# Patient Record
Sex: Female | Born: 1935 | Race: White | Hispanic: No | Smoking: Never smoker
Health system: Southern US, Community
[De-identification: ages and names within clinical notes are randomized; demographics above are authoritative.]

## PROBLEM LIST (undated history)

## (undated) ENCOUNTER — Emergency Department (HOSPITAL_COMMUNITY): Admission: EM | Payer: Medicare HMO

## (undated) DIAGNOSIS — J302 Other seasonal allergic rhinitis: Secondary | ICD-10-CM

## (undated) DIAGNOSIS — Z9989 Dependence on other enabling machines and devices: Secondary | ICD-10-CM

## (undated) DIAGNOSIS — M1990 Unspecified osteoarthritis, unspecified site: Secondary | ICD-10-CM

## (undated) DIAGNOSIS — IMO0002 Reserved for concepts with insufficient information to code with codable children: Secondary | ICD-10-CM

## (undated) DIAGNOSIS — J449 Chronic obstructive pulmonary disease, unspecified: Secondary | ICD-10-CM

## (undated) DIAGNOSIS — K5792 Diverticulitis of intestine, part unspecified, without perforation or abscess without bleeding: Secondary | ICD-10-CM

## (undated) DIAGNOSIS — G4733 Obstructive sleep apnea (adult) (pediatric): Secondary | ICD-10-CM

## (undated) HISTORY — DX: Unspecified osteoarthritis, unspecified site: M19.90

## (undated) HISTORY — PX: CHOLECYSTECTOMY: SHX55

## (undated) HISTORY — DX: Chronic obstructive pulmonary disease, unspecified: J44.9

## (undated) HISTORY — DX: Reserved for concepts with insufficient information to code with codable children: IMO0002

## (undated) HISTORY — PX: APPENDECTOMY: SHX54

## (undated) HISTORY — PX: TOTAL KNEE ARTHROPLASTY: SHX125

## (undated) HISTORY — PX: CATARACT EXTRACTION, BILATERAL: SHX1313

---

## 1977-01-08 HISTORY — PX: PLEURAL SCARIFICATION: SHX748

## 1997-09-21 ENCOUNTER — Encounter (HOSPITAL_COMMUNITY): Admission: RE | Admit: 1997-09-21 | Discharge: 1997-12-20 | Payer: Self-pay | Admitting: *Deleted

## 1997-12-21 ENCOUNTER — Encounter (HOSPITAL_COMMUNITY): Admission: RE | Admit: 1997-12-21 | Discharge: 1998-03-21 | Payer: Self-pay | Admitting: *Deleted

## 1998-03-22 ENCOUNTER — Encounter (HOSPITAL_COMMUNITY): Admission: RE | Admit: 1998-03-22 | Discharge: 1998-06-20 | Payer: Self-pay | Admitting: *Deleted

## 1998-06-06 ENCOUNTER — Encounter: Admission: RE | Admit: 1998-06-06 | Discharge: 1998-06-15 | Payer: Self-pay | Admitting: Orthopaedic Surgery

## 1999-06-15 ENCOUNTER — Encounter: Admission: RE | Admit: 1999-06-15 | Discharge: 1999-06-15 | Payer: Self-pay | Admitting: Obstetrics and Gynecology

## 1999-06-15 ENCOUNTER — Encounter: Payer: Self-pay | Admitting: Obstetrics and Gynecology

## 1999-07-06 ENCOUNTER — Other Ambulatory Visit: Admission: RE | Admit: 1999-07-06 | Discharge: 1999-07-06 | Payer: Self-pay | Admitting: Obstetrics and Gynecology

## 2000-02-06 ENCOUNTER — Ambulatory Visit (HOSPITAL_BASED_OUTPATIENT_CLINIC_OR_DEPARTMENT_OTHER): Admission: RE | Admit: 2000-02-06 | Discharge: 2000-02-06 | Payer: Self-pay | Admitting: Internal Medicine

## 2000-06-27 ENCOUNTER — Encounter: Admission: RE | Admit: 2000-06-27 | Discharge: 2000-06-27 | Payer: Self-pay | Admitting: Obstetrics and Gynecology

## 2000-06-27 ENCOUNTER — Encounter: Payer: Self-pay | Admitting: Obstetrics and Gynecology

## 2000-07-01 ENCOUNTER — Other Ambulatory Visit: Admission: RE | Admit: 2000-07-01 | Discharge: 2000-07-01 | Payer: Self-pay | Admitting: Obstetrics and Gynecology

## 2000-07-04 ENCOUNTER — Encounter: Payer: Self-pay | Admitting: Obstetrics and Gynecology

## 2000-07-04 ENCOUNTER — Encounter: Admission: RE | Admit: 2000-07-04 | Discharge: 2000-07-04 | Payer: Self-pay | Admitting: Obstetrics and Gynecology

## 2000-12-04 ENCOUNTER — Emergency Department (HOSPITAL_COMMUNITY): Admission: EM | Admit: 2000-12-04 | Discharge: 2000-12-04 | Payer: Self-pay

## 2000-12-05 ENCOUNTER — Encounter: Payer: Self-pay | Admitting: *Deleted

## 2001-02-27 ENCOUNTER — Ambulatory Visit (HOSPITAL_COMMUNITY): Admission: RE | Admit: 2001-02-27 | Discharge: 2001-02-27 | Payer: Self-pay | Admitting: Gastroenterology

## 2001-04-17 ENCOUNTER — Encounter: Payer: Self-pay | Admitting: Internal Medicine

## 2001-04-17 ENCOUNTER — Encounter: Admission: RE | Admit: 2001-04-17 | Discharge: 2001-04-17 | Payer: Self-pay | Admitting: Internal Medicine

## 2001-08-06 ENCOUNTER — Other Ambulatory Visit: Admission: RE | Admit: 2001-08-06 | Discharge: 2001-08-06 | Payer: Self-pay | Admitting: Gynecology

## 2002-01-13 ENCOUNTER — Inpatient Hospital Stay (HOSPITAL_COMMUNITY): Admission: EM | Admit: 2002-01-13 | Discharge: 2002-01-16 | Payer: Self-pay | Admitting: Emergency Medicine

## 2002-01-15 ENCOUNTER — Encounter: Payer: Self-pay | Admitting: Internal Medicine

## 2002-10-15 ENCOUNTER — Encounter: Payer: Self-pay | Admitting: Internal Medicine

## 2002-10-15 ENCOUNTER — Encounter: Admission: RE | Admit: 2002-10-15 | Discharge: 2002-10-15 | Payer: Self-pay | Admitting: Internal Medicine

## 2003-01-14 ENCOUNTER — Ambulatory Visit (HOSPITAL_COMMUNITY): Admission: RE | Admit: 2003-01-14 | Discharge: 2003-01-14 | Payer: Self-pay | Admitting: Orthopaedic Surgery

## 2003-02-05 ENCOUNTER — Emergency Department (HOSPITAL_COMMUNITY): Admission: EM | Admit: 2003-02-05 | Discharge: 2003-02-05 | Payer: Self-pay | Admitting: Family Medicine

## 2003-09-06 ENCOUNTER — Encounter: Admission: RE | Admit: 2003-09-06 | Discharge: 2003-09-06 | Payer: Self-pay | Admitting: Gastroenterology

## 2004-01-19 ENCOUNTER — Ambulatory Visit (HOSPITAL_BASED_OUTPATIENT_CLINIC_OR_DEPARTMENT_OTHER): Admission: RE | Admit: 2004-01-19 | Discharge: 2004-01-19 | Payer: Self-pay | Admitting: Internal Medicine

## 2004-01-19 ENCOUNTER — Ambulatory Visit: Payer: Self-pay | Admitting: Internal Medicine

## 2004-02-08 ENCOUNTER — Ambulatory Visit: Payer: Self-pay | Admitting: Internal Medicine

## 2004-03-18 ENCOUNTER — Inpatient Hospital Stay (HOSPITAL_COMMUNITY): Admission: EM | Admit: 2004-03-18 | Discharge: 2004-03-22 | Payer: Self-pay | Admitting: Emergency Medicine

## 2004-04-11 ENCOUNTER — Ambulatory Visit: Payer: Self-pay | Admitting: Physical Medicine & Rehabilitation

## 2004-04-11 ENCOUNTER — Inpatient Hospital Stay (HOSPITAL_COMMUNITY): Admission: RE | Admit: 2004-04-11 | Discharge: 2004-04-14 | Payer: Self-pay | Admitting: Orthopaedic Surgery

## 2004-04-14 ENCOUNTER — Inpatient Hospital Stay
Admission: RE | Admit: 2004-04-14 | Discharge: 2004-04-20 | Payer: Self-pay | Admitting: Physical Medicine & Rehabilitation

## 2004-04-15 ENCOUNTER — Ambulatory Visit (HOSPITAL_COMMUNITY)
Admission: RE | Admit: 2004-04-15 | Discharge: 2004-04-15 | Payer: Self-pay | Admitting: Physical Medicine & Rehabilitation

## 2004-06-07 ENCOUNTER — Ambulatory Visit: Payer: Self-pay | Admitting: Internal Medicine

## 2004-09-10 ENCOUNTER — Emergency Department (HOSPITAL_COMMUNITY): Admission: EM | Admit: 2004-09-10 | Discharge: 2004-09-11 | Payer: Self-pay | Admitting: Emergency Medicine

## 2004-10-09 ENCOUNTER — Encounter: Admission: RE | Admit: 2004-10-09 | Discharge: 2004-11-07 | Payer: Self-pay | Admitting: Orthopaedic Surgery

## 2004-11-07 ENCOUNTER — Other Ambulatory Visit: Admission: RE | Admit: 2004-11-07 | Discharge: 2004-11-07 | Payer: Self-pay | Admitting: Gynecology

## 2004-12-05 ENCOUNTER — Ambulatory Visit: Payer: Self-pay | Admitting: Internal Medicine

## 2005-05-18 ENCOUNTER — Ambulatory Visit: Payer: Self-pay | Admitting: Internal Medicine

## 2005-05-29 ENCOUNTER — Ambulatory Visit: Payer: Self-pay | Admitting: Internal Medicine

## 2005-08-24 IMAGING — CR DG CHEST 2V
2 series · 2 of 2 positions shown · non-contrast
Comparison: 03/22/04.

CLINICAL DATA: Preop evaluation for osteoarthritis.
 CHEST ? 2 VIEW:

[view not recorded (1 of 2)]
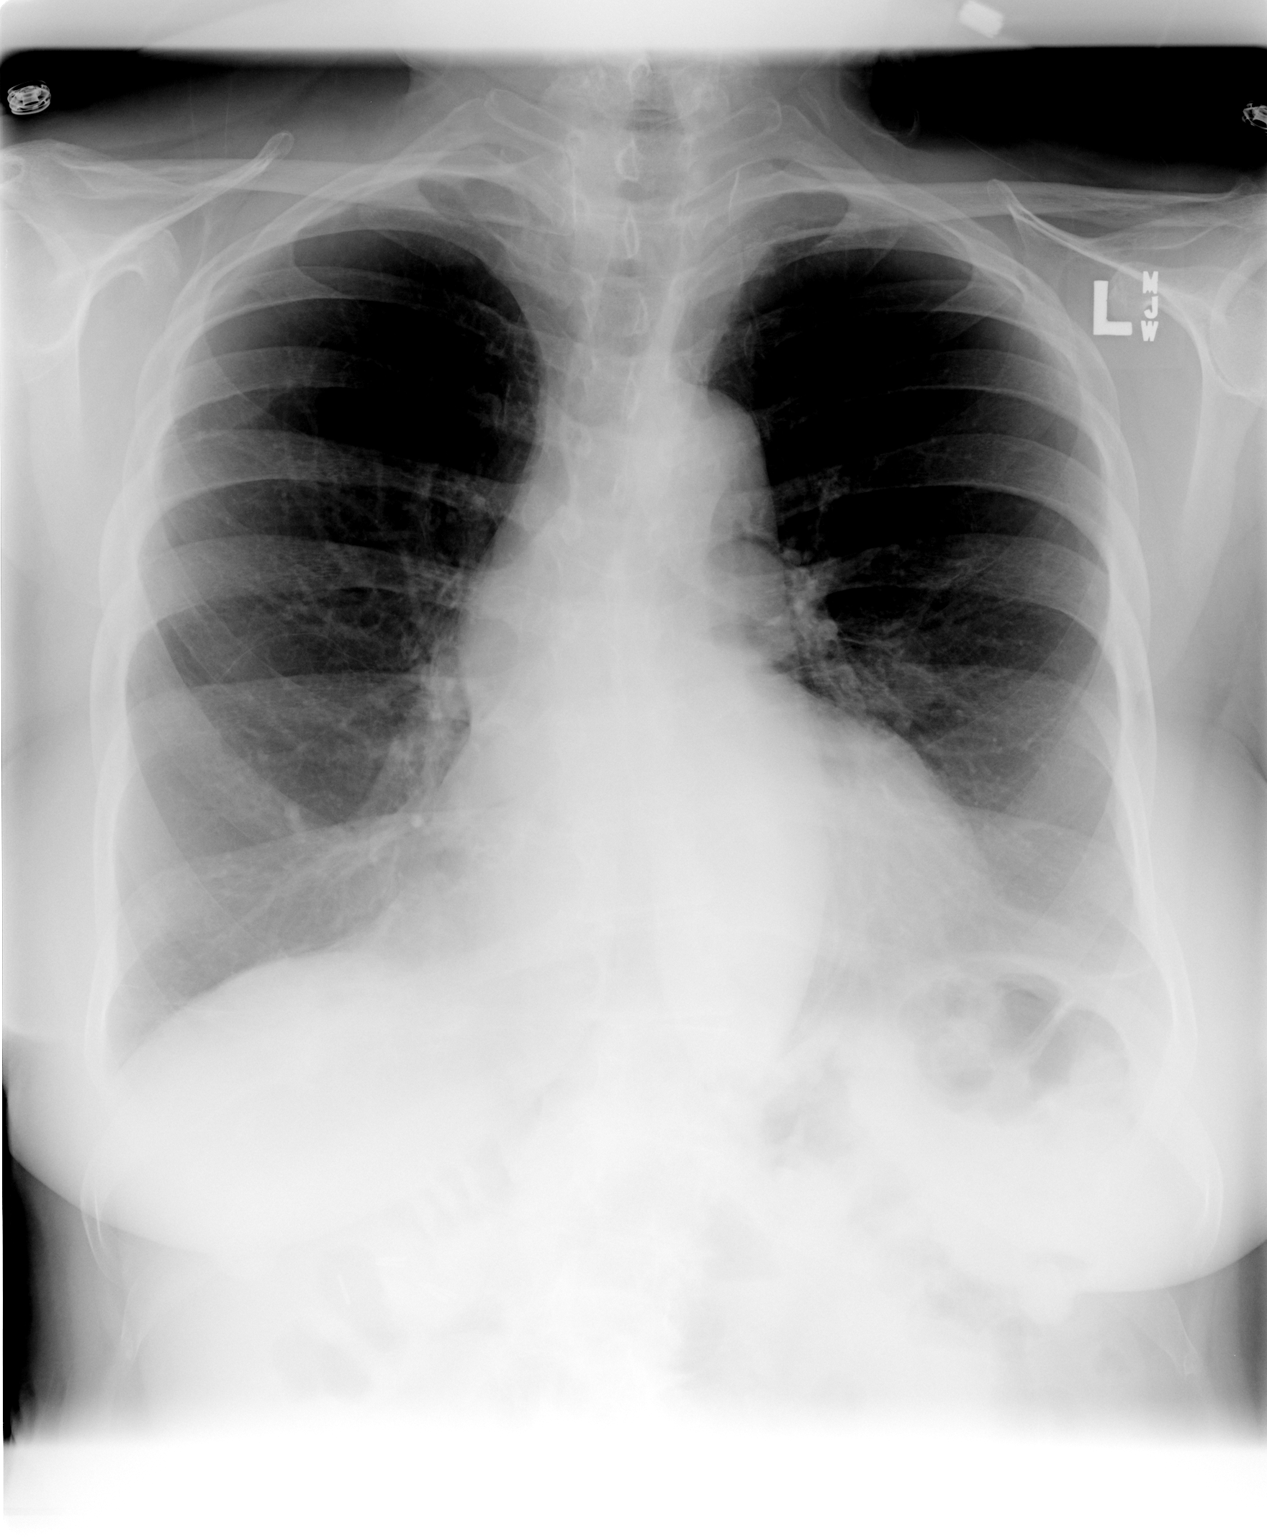

[view not recorded (2 of 2)]
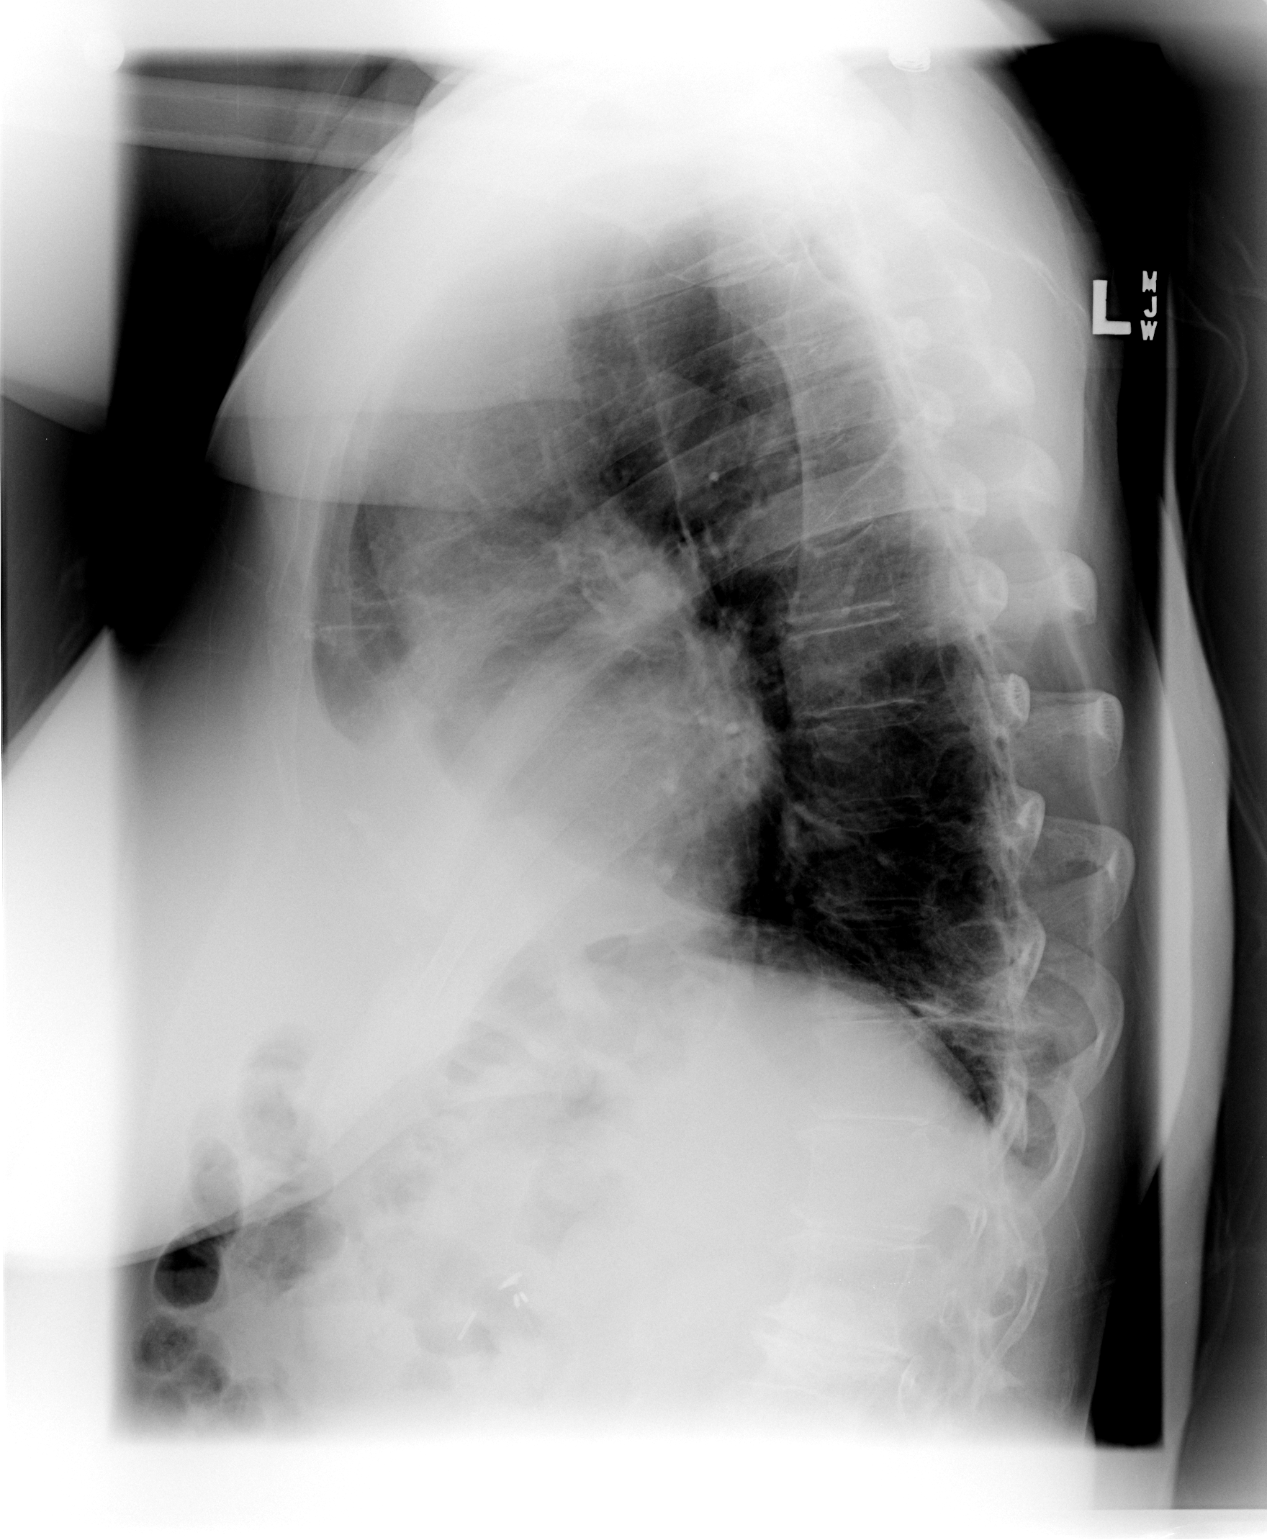

[2 of 2 positions shown; findings below may reference images not displayed]

FINDINGS: Cardiopericardial silhouette is enlarged, but stable.  Basilar atelectasis or scarring on the left is most prominent on the lateral film.  Tortuosity of the thoracic aorta suggests hypertension.  Imaged bony structures are unremarkable.
IMPRESSION: No acute cardiopulmonary process.

## 2005-11-05 ENCOUNTER — Ambulatory Visit (HOSPITAL_BASED_OUTPATIENT_CLINIC_OR_DEPARTMENT_OTHER): Admission: RE | Admit: 2005-11-05 | Discharge: 2005-11-05 | Payer: Self-pay | Admitting: Internal Medicine

## 2005-11-11 ENCOUNTER — Ambulatory Visit: Payer: Self-pay | Admitting: Internal Medicine

## 2006-01-10 ENCOUNTER — Ambulatory Visit (HOSPITAL_BASED_OUTPATIENT_CLINIC_OR_DEPARTMENT_OTHER): Admission: RE | Admit: 2006-01-10 | Discharge: 2006-01-10 | Payer: Self-pay | Admitting: Internal Medicine

## 2006-01-13 ENCOUNTER — Ambulatory Visit: Payer: Self-pay | Admitting: Internal Medicine

## 2006-02-05 ENCOUNTER — Ambulatory Visit: Payer: Self-pay | Admitting: Internal Medicine

## 2006-12-02 ENCOUNTER — Other Ambulatory Visit: Admission: RE | Admit: 2006-12-02 | Discharge: 2006-12-02 | Payer: Self-pay | Admitting: Gynecology

## 2007-01-08 ENCOUNTER — Encounter: Payer: Self-pay | Admitting: Internal Medicine

## 2007-01-09 HISTORY — PX: PARTIAL COLECTOMY: SHX5273

## 2007-01-13 ENCOUNTER — Encounter: Admission: RE | Admit: 2007-01-13 | Discharge: 2007-01-13 | Payer: Self-pay | Admitting: General Surgery

## 2007-01-17 ENCOUNTER — Encounter: Admission: RE | Admit: 2007-01-17 | Discharge: 2007-01-17 | Payer: Self-pay | Admitting: General Surgery

## 2007-01-24 ENCOUNTER — Encounter: Payer: Self-pay | Admitting: Internal Medicine

## 2007-01-28 DIAGNOSIS — J449 Chronic obstructive pulmonary disease, unspecified: Secondary | ICD-10-CM

## 2007-01-28 DIAGNOSIS — J4489 Other specified chronic obstructive pulmonary disease: Secondary | ICD-10-CM | POA: Insufficient documentation

## 2007-01-28 DIAGNOSIS — M1990 Unspecified osteoarthritis, unspecified site: Secondary | ICD-10-CM | POA: Insufficient documentation

## 2007-01-28 DIAGNOSIS — G4733 Obstructive sleep apnea (adult) (pediatric): Secondary | ICD-10-CM

## 2007-01-29 ENCOUNTER — Ambulatory Visit: Payer: Self-pay | Admitting: Internal Medicine

## 2007-02-18 ENCOUNTER — Inpatient Hospital Stay (HOSPITAL_COMMUNITY): Admission: RE | Admit: 2007-02-18 | Discharge: 2007-02-23 | Payer: Self-pay | Admitting: General Surgery

## 2007-02-18 ENCOUNTER — Ambulatory Visit: Payer: Self-pay | Admitting: Oncology

## 2007-02-18 ENCOUNTER — Encounter (INDEPENDENT_AMBULATORY_CARE_PROVIDER_SITE_OTHER): Payer: Self-pay | Admitting: General Surgery

## 2007-02-21 ENCOUNTER — Ambulatory Visit: Payer: Self-pay | Admitting: Oncology

## 2007-02-26 ENCOUNTER — Encounter: Payer: Self-pay | Admitting: Internal Medicine

## 2007-05-09 ENCOUNTER — Encounter: Admission: RE | Admit: 2007-05-09 | Discharge: 2007-05-09 | Payer: Self-pay | Admitting: Orthopaedic Surgery

## 2007-08-07 ENCOUNTER — Encounter: Admission: RE | Admit: 2007-08-07 | Discharge: 2007-08-07 | Payer: Self-pay | Admitting: Internal Medicine

## 2007-09-09 ENCOUNTER — Encounter: Admission: RE | Admit: 2007-09-09 | Discharge: 2007-09-09 | Payer: Self-pay | Admitting: Neurology

## 2007-09-18 ENCOUNTER — Ambulatory Visit: Payer: Self-pay

## 2008-01-29 ENCOUNTER — Ambulatory Visit: Payer: Self-pay | Admitting: Internal Medicine

## 2008-02-13 DIAGNOSIS — C189 Malignant neoplasm of colon, unspecified: Secondary | ICD-10-CM | POA: Insufficient documentation

## 2008-04-27 ENCOUNTER — Encounter: Admission: RE | Admit: 2008-04-27 | Discharge: 2008-04-27 | Payer: Self-pay | Admitting: General Surgery

## 2008-05-13 ENCOUNTER — Encounter: Payer: Self-pay | Admitting: Internal Medicine

## 2008-10-06 ENCOUNTER — Encounter
Admission: RE | Admit: 2008-10-06 | Discharge: 2009-01-04 | Payer: Self-pay | Admitting: Physical Medicine and Rehabilitation

## 2008-11-30 ENCOUNTER — Ambulatory Visit: Payer: Self-pay | Admitting: Internal Medicine

## 2008-12-30 ENCOUNTER — Ambulatory Visit: Payer: Self-pay | Admitting: Internal Medicine

## 2008-12-30 DIAGNOSIS — R0602 Shortness of breath: Secondary | ICD-10-CM

## 2009-01-19 ENCOUNTER — Telehealth: Payer: Self-pay | Admitting: Internal Medicine

## 2009-02-10 ENCOUNTER — Encounter (HOSPITAL_COMMUNITY): Admission: RE | Admit: 2009-02-10 | Discharge: 2009-05-11 | Payer: Self-pay | Admitting: Internal Medicine

## 2009-02-15 ENCOUNTER — Encounter: Payer: Self-pay | Admitting: Internal Medicine

## 2009-03-04 ENCOUNTER — Telehealth: Payer: Self-pay | Admitting: Internal Medicine

## 2009-03-16 ENCOUNTER — Telehealth: Payer: Self-pay | Admitting: Internal Medicine

## 2009-03-28 ENCOUNTER — Telehealth (INDEPENDENT_AMBULATORY_CARE_PROVIDER_SITE_OTHER): Payer: Self-pay | Admitting: *Deleted

## 2009-03-31 ENCOUNTER — Telehealth (INDEPENDENT_AMBULATORY_CARE_PROVIDER_SITE_OTHER): Payer: Self-pay | Admitting: *Deleted

## 2009-03-31 DIAGNOSIS — R197 Diarrhea, unspecified: Secondary | ICD-10-CM | POA: Insufficient documentation

## 2009-04-01 ENCOUNTER — Ambulatory Visit: Payer: Self-pay | Admitting: Internal Medicine

## 2009-04-12 ENCOUNTER — Encounter (INDEPENDENT_AMBULATORY_CARE_PROVIDER_SITE_OTHER): Payer: Self-pay | Admitting: *Deleted

## 2009-05-09 ENCOUNTER — Encounter: Admission: RE | Admit: 2009-05-09 | Discharge: 2009-05-09 | Payer: Self-pay | Admitting: General Surgery

## 2009-05-16 ENCOUNTER — Encounter: Payer: Self-pay | Admitting: Internal Medicine

## 2009-05-17 ENCOUNTER — Encounter: Payer: Self-pay | Admitting: Internal Medicine

## 2009-07-04 ENCOUNTER — Telehealth (INDEPENDENT_AMBULATORY_CARE_PROVIDER_SITE_OTHER): Payer: Self-pay | Admitting: *Deleted

## 2009-09-28 ENCOUNTER — Ambulatory Visit: Payer: Self-pay | Admitting: Internal Medicine

## 2009-09-28 DIAGNOSIS — B370 Candidal stomatitis: Secondary | ICD-10-CM

## 2009-11-02 ENCOUNTER — Telehealth (INDEPENDENT_AMBULATORY_CARE_PROVIDER_SITE_OTHER): Payer: Self-pay | Admitting: *Deleted

## 2009-11-29 ENCOUNTER — Encounter: Payer: Self-pay | Admitting: Internal Medicine

## 2010-01-06 ENCOUNTER — Encounter: Payer: Self-pay | Admitting: Internal Medicine

## 2010-01-06 ENCOUNTER — Telehealth: Payer: Self-pay | Admitting: Internal Medicine

## 2010-01-10 ENCOUNTER — Telehealth: Payer: Self-pay | Admitting: Internal Medicine

## 2010-01-12 ENCOUNTER — Encounter: Payer: Self-pay | Admitting: Internal Medicine

## 2010-01-12 LAB — URINALYSIS, ROUTINE W REFLEX MICROSCOPIC
Bilirubin Urine: NEGATIVE
Hemoglobin, Urine: NEGATIVE
Ketones, ur: NEGATIVE mg/dL
Nitrite: NEGATIVE
Protein, ur: NEGATIVE mg/dL
Specific Gravity, Urine: 1.022 (ref 1.005–1.030)
Urine Glucose, Fasting: NEGATIVE mg/dL
Urobilinogen, UA: 0.2 mg/dL (ref 0.0–1.0)
pH: 6 (ref 5.0–8.0)

## 2010-01-12 LAB — COMPREHENSIVE METABOLIC PANEL
ALT: 24 U/L (ref 0–35)
AST: 27 U/L (ref 0–37)
Albumin: 3.9 g/dL (ref 3.5–5.2)
Alkaline Phosphatase: 58 U/L (ref 39–117)
BUN: 12 mg/dL (ref 6–23)
CO2: 26 mEq/L (ref 19–32)
Calcium: 9.9 mg/dL (ref 8.4–10.5)
Chloride: 106 mEq/L (ref 96–112)
Creatinine, Ser: 0.76 mg/dL (ref 0.4–1.2)
GFR calc Af Amer: >60 mL/min (ref >60)
GFR calc non Af Amer: >60 mL/min (ref >60)
Glucose, Bld: 90 mg/dL (ref 70–99)
Potassium: 4.2 mEq/L (ref 3.5–5.1)
Sodium: 140 mEq/L (ref 135–145)
Total Bilirubin: 0.5 mg/dL (ref 0.3–1.2)
Total Protein: 7.1 g/dL (ref 6.0–8.3)

## 2010-01-12 LAB — DIFFERENTIAL
Basophils Absolute: 0.1 10*3/uL (ref 0.0–0.1)
Basophils Relative: 1 % (ref 0–1)
Eosinophils Absolute: 0.1 10*3/uL (ref 0.0–0.7)
Eosinophils Relative: 2 % (ref 0–5)
Lymphocytes Relative: 24 % (ref 12–46)
Lymphs Abs: 1.8 10*3/uL (ref 0.7–4.0)
Monocytes Absolute: 0.5 10*3/uL (ref 0.1–1.0)
Monocytes Relative: 7 % (ref 3–12)
Neutro Abs: 4.9 10*3/uL (ref 1.7–7.7)
Neutrophils Relative %: 66 % (ref 43–77)

## 2010-01-12 LAB — CBC
HCT: 41.3 % (ref 36.0–46.0)
Hemoglobin: 13.5 g/dL (ref 12.0–15.0)
MCH: 29.5 pg (ref 26.0–34.0)
MCHC: 32.7 g/dL (ref 30.0–36.0)
MCV: 90.4 fL (ref 78.0–100.0)
Platelets: 287 10*3/uL (ref 150–400)
RBC: 4.57 MIL/uL (ref 3.87–5.11)
RDW: 12.9 % (ref 11.5–15.5)
WBC: 7.3 10*3/uL (ref 4.0–10.5)

## 2010-01-13 ENCOUNTER — Ambulatory Visit
Admission: RE | Admit: 2010-01-13 | Discharge: 2010-01-13 | Payer: Self-pay | Source: Home / Self Care | Attending: Internal Medicine | Admitting: Internal Medicine

## 2010-01-13 ENCOUNTER — Encounter: Payer: Self-pay | Admitting: Internal Medicine

## 2010-01-15 ENCOUNTER — Encounter: Payer: Self-pay | Admitting: Internal Medicine

## 2010-01-20 ENCOUNTER — Ambulatory Visit (HOSPITAL_COMMUNITY)
Admission: RE | Admit: 2010-01-20 | Discharge: 2010-01-21 | Payer: Self-pay | Source: Home / Self Care | Attending: General Surgery | Admitting: General Surgery

## 2010-02-02 ENCOUNTER — Encounter: Payer: Self-pay | Admitting: Internal Medicine

## 2010-02-02 LAB — SURGICAL PCR SCREEN
MRSA, PCR: NEGATIVE
Staphylococcus aureus: NEGATIVE

## 2010-02-07 NOTE — Progress Notes (Signed)
Summary: pulm rehab  Phone Note Call from Patient Call back at Home Phone [phone]   Caller: Patient Call For: young Summary of Call: pt states she is still waiting to hear from nurse re: pulm rehab.  Initial call taken by: Tivis Ringer,  January 19, 2009 12:45 PM  Follow-up for Phone Call        pt states she still hasn't heard from anyone regarding getting set up for pulm rehab.  Per EMR,  CY ordered pulm rehab 12-30-2008. Will forward to PCCs to address.  Aundra Millet Reynolds LPN  January 19, 2009 3:07 PM   Additional Follow-up for Phone Call Additional follow up Details #1::        Pt was advised at time of ov that it normally takes 2-3 wks once order is sent for Phyllis at [facility] to contact them. Pt was seen on 12/30/08. Explained to pt that due to the holidays and the snow that it may be closer to 3 wks before she will hear from [place]. Called Pulmonary Rehab and asked them if they could contact her with information about the program and schedule if possible. Pt aware. Rhonda Cobb  January 19, 2009 3:12 PM

## 2010-02-07 NOTE — Progress Notes (Signed)
Summary: GREEN SPUTUM  Phone Note Call from Patient   Caller: Patient Call For: YOUNG Summary of Call: PT COUGHING UP GREEN SP[facility] [phone] Initial call taken by: Rickard Patience,  March 04, 2009 11:56 AM  Follow-up for Phone Call        Spoke with pt earlier today and she stated at that time that her cough was dry, denied chest congestion or coughing up any phlegm. Pt states she just cough and coughed up green phlegm and she does now feel like she has some congstion in her chest. Pt has rx sor cough syrup. Please advise.Carron Curie CMA  March 04, 2009 11:59 AM allergies: pcn, sulfa  per CY doxycycline 100 2 today then one daily until gone #8.  rx sent/ pt aware. Carron Curie CMA  March 04, 2009 12:16 PM     New/Updated Medications: DOXYCYCLINE HYCLATE 100 MG TABS (DOXYCYCLINE HYCLATE) 2 tabs today then one daily until gone Prescriptions: DOXYCYCLINE HYCLATE 100 MG TABS (DOXYCYCLINE HYCLATE) 2 tabs today then one daily until gone  #8 x 0   Entered by:   Carron Curie CMA   Authorized by:   Waymon Budge MD   Signed by:   Carron Curie CMA on 03/04/2009   Method used:   Electronically to        [facility]  [phone]* (retail)       [address]       [place], [place]  [address]       Ph: [phone] or [phone]       Fax: [phone]   RxID:   [id number]

## 2010-02-07 NOTE — Letter (Signed)
Summary: Dr [facility]  Dr [facility]   Imported By: Sherian Rein 06/03/2009 13:35:19  _____________________________________________________________________  External Attachment:    Type:   Image     Comment:   External Document

## 2010-02-07 NOTE — Progress Notes (Signed)
Summary: YEAST INFECTION IN HER MOUTH  Phone Note Call from Patient Call back at Home Phone [phone]   Caller: Patient Call For: YOUNG Summary of Call: PT NEEDS SOMETHING FOR A YEAST INFECTION IN HER MOUTH [facility] Initial call taken by: Lacinda Axon,  July 04, 2009 12:05 PM  Follow-up for Phone Call        Pt c/o trush- white film on her toungue and sore mouth.  Would like "lozenges" for this called in.  She states that she does rinse mouth well after she uses both spiriva and advair. Please advise, thanks! Follow-up by: Vernie Murders,  July 04, 2009 12:20 PM  Additional Follow-up for Phone Call Additional follow up Details #1::        OK to send mycelex troches, # 30, melt in mouth three times a day. Suggest she try taking Advair before meals, so the mechanics of chewing and swallowing help clear her mouth. Additional Follow-up by: Waymon Budge MD,  July 04, 2009 1:03 PM    Additional Follow-up for Phone Call Additional follow up Details #2::    Called, spoke with pt.  Pt informed [facility] and advised her of above recs per CY.  She verbalized understanding.  Gweneth Dimitri RN  July 04, 2009 1:41 PM   New/Updated Medications: MYCELEX 10 MG TROC (CLOTRIMAZOLE) melt in mouth three times a day Prescriptions: MYCELEX 10 MG TROC (CLOTRIMAZOLE) melt in mouth three times a day  #30 x 0   Entered by:   Gweneth Dimitri RN   Authorized by:   Waymon Budge MD   Signed by:   Gweneth Dimitri RN on 07/04/2009   Method used:   Electronically to        [facility]  [phone]* (retail)       [address]       [place], [place]  [address]       Ph: [phone] or [phone]       Fax: [phone]   RxID:   [id number]

## 2010-02-07 NOTE — Assessment & Plan Note (Signed)
Summary: rov 6 months///kp   Primary Provider/Referring Provider:  Jeremy Johann  CC:  6 month follow up visit-asthma;was advised to stop Pulmonary Rehab due to weakness; breathing hasnt  been to bad except for today.Marland Kitchen  History of Present Illness: December 30, 2008- Asthma, COPD Still feels limited bvy dyspnea since colon cancer surgery. Weight last visit 158. Dyspnea with exertion. OK at rest and denies cough or congestion. Denbies known heart disease Had a lot of pneumnia, cough or wheeze when younger- no longer. Her father smoked heavily/ died COPD. Her A1AT test was NL.She did pulmonary rehab remotely. CXR-  basilar atelectasis, NAD - Only 192 m, stoped early sob and dizzy, w/ nl oxygenation 97%/ 95, 96% PFT- severe COPD w/ some response to dilator, Nl DLCO, airtrapping and hyperinflation  April 01, 2009- Asthma, COPD Diarrhea started 3-4 days after finishing doxycycline and persisted after kaopectate x 2 days. We referred to hx of colon surgery. Breathing is better and she is off prednisone. Sore across lower abdomen but not real pain like previous diverticulitis, no blood. No fever or chills.  September 28, 2009- Asthma, COPD, hx OSA Cites generalized malaise for not using CPAP saying she is resting well and breathing has been good. Complains of persistent thrush and says postnasal drip tastes like it. Being evaluated for persistent diarhea- Dr Dulce Sellar. Not using Advair regularly. Used rescue inhaler 0-2 or 3 x/ day, blaming the humid hot summer weather. Cites the thrush for not wanting to use the Advair regularly. Diflucan helped for awhile. Dry cough x few months- off Micardis now.  Not using Spiriva- not using, not helpful.    Asthma History    Initial Asthma Severity Rating:    Age range: 12+ years    Symptoms: >2 days/week; not daily    Nighttime Awakenings: 0-2/month    Interferes w/ normal activity: no limitations    SABA use (not for EIB): >2 days/week but not >1X/day    Asthma Severity Assessment: Mild Persistent   Preventive Screening-Counseling & Management  Alcohol-Tobacco     Smoking Status: never     Passive Smoke Exposure: yes  Current Medications (verified): 1)  Synthroid 100 Mcg  Tabs (Levothyroxine Sodium) .... Take 1 Tablet By Mouth Once A Day 2)  Prilosec Otc 20 Mg  Tbec (Omeprazole Magnesium) .... Take 1 Capsule By Mouth Once A Day 3)  Singulair 10 Mg  Tabs (Montelukast Sodium) .... Take 1 Tablet By Mouth Once A Day 4)  Advair Diskus 100-50 Mcg/dose  Misc (Fluticasone-Salmeterol) .... Use One Puff Two Times A Day Rinse Mouth After Each Use 5)  Cpap .... Set At 9; [facility] 6)  Proair Hfa 108 (90 Base) Mcg/act Aers (Albuterol Sulfate) .... 2 Puffs Four Times A Day As Needed 7)  Klor-Con 8 Meq Cr-Tabs (Potassium Chloride) .... Take 2 By Mouth Two Times A Day 8)  Cetirizine Hcl 10 Mg Tabs (Cetirizine Hcl) .... Take 1 By Mouth Once Daily 9)  Vitamin D 2000 Unit Tabs (Cholecalciferol) .... Take 1 By Mouth Once Daily 10)  Melatonin 5 Mg Tabs (Melatonin) .... Take 2 By Mouth At Bedtime 11)  Zoloft 50 Mg Tabs (Sertraline Hcl) .... Take 1 By Mouth Once Daily 12)  Spiriva Handihaler 18 Mcg Caps (Tiotropium Bromide Monohydrate) .Marland Kitchen.. 1 Daily 13)  Flagyl 500 Mg Tabs (Metronidazole) .... Take 1 Tablet By Mouth Three Times A Day For 7 Days 14)  Mycelex 10 Mg Troc (Clotrimazole) .... Melt in Mouth Three Times A Day 15)  Align  Caps (Probiotic Product) .... Take 1 By Mouth Once Daily  Allergies (verified): 1)  Penicillin G Potassium (Penicillin G Potassium) 2)  Sulfamethoxazole (Sulfamethoxazole)  Past History:  Past Medical History: Last updated: 01/29/2008 Asthma with COPD -was considered steroid dependent, kept on predisone 1976 until 2006 adenocarcioma "cyst", abdominal 2009 Osteoarthritis Sleep apnea NPSG AHI 17.9/hr. FEV1/FVC 05/29/05 0.43 No hx cardiac or coagulopathy degenerative disk disease  Past Surgical History: Last updated:  04/01/2009 asthma/ copd Recurrent spontaneuous pneumothoraxs  L - finally had talc pleurodesis- 1979. Appendectomy Cholecystectomy Total Knee Arthroplasty-L Partial colectomy for diverticulitis -2009  Family History: Last updated: 01/28/2009 Father died COPD Mother died MI Brother - has copd  Social History: Last updated: 2009-01-28 Patient never smoked.  Retired- Writer at [employer] Positive history of passive tobacco smoke exposure- significant exposure to parents, heavy smokers  Risk Factors: Smoking Status: never (09/28/2009) Passive Smoke Exposure: yes (09/28/2009)  Review of Systems      See HPI       The patient complains of shortness of breath with activity.  The patient denies shortness of breath at rest, productive cough, non-productive cough, coughing up blood, chest pain, irregular heartbeats, acid heartburn, indigestion, loss of appetite, weight change, abdominal pain, difficulty swallowing, tooth/dental problems, headaches, nasal congestion/difficulty breathing through nose, sneezing, itching, ear ache, and rash.    Vital Signs:  Patient profile:   76 year old female Height:      61 inches Weight:      156.13 pounds BMI:     29.61 O2 Sat:      95 % on Room air Pulse rate:   113 / minute BP sitting:   146 / 92  (right arm) Cuff size:   regular  Vitals Entered By: Reynaldo Minium CMA (September 28, 2009 10:49 AM)  O2 Flow:  Room air CC: 6 month follow up visit-asthma;was advised to stop Pulmonary Rehab due to weakness; breathing hasnt  been to bad except for today.   Physical Exam  Additional Exam:  General: A/Ox3; pleasant and cooperative, NAD, calm SKIN: no rash, lesions NODES: no lymphadenopathy HEENT: [place]/AT, EOM- WNL, Conjuctivae- clear, PERRLA, TM-WNL, Nose- clear, Throat- thrush/ coated tongue,  Mallampatii III, dental repair NECK: Supple w/ fair ROM, JVD- none, normal carotid impulses w/o bruits Thyroid-  CHEST: Clear to P&A, no  rales, dullness or rhonchi, unlabored HEART: RRR, no m/g/r heard ABDOMEN: Soft and nl;  FIE:PPIR, nl pulses, no edema  NEURO: Grossly intact to observation       Impression & Recommendations:  Problem # 1:  OBSTRUCTIVE SLEEP APNEA (ICD-327.23)  Not using CPAP and I am not sure it is a significant issue to push now. She lives alone and has been uncomfortable with unrelated problems, so it is hard to motivate her. We will carry this problem forward and reassess as appropriate.  Orders: Est. Patient Level IV (51884)  Problem # 2:  ASTHMA (ICD-493.90) Good control usually with only a rescue inhaler. She has had at least 2 pneumovax. With recurrent thrush, I will have her stay off the Advair for now and see what happens. She will use the rescue inhaler as needed and stay on Singulair.  Problem # 3:  COPD (ICD-496) She oxygenates well although previous PFT looked like pretty severe obstruction. Jhonnie Garner she feels a little stronger she may be a ble to blow a little harder for another try.  Problem # 4:  THRUSH (ICD-112.0) Will give diflucan. As long as her  breathing is doing well we have an opportunity to be off inhaled steroids which should help. She denies diabetes.  Medications Added to Medication List This Visit: 1)  Align Caps (Probiotic product) .... Take 1 by mouth once daily 2)  Fluconazole 150 Mg Tabs (Fluconazole) .Marland Kitchen.. 1 dailyu for yeast  Other Orders: Flu Vaccine 21yrs + MEDICARE PATIENTS (Z6109) Administration Flu vaccine - MCR (U0454)  Patient Instructions: 1)  Please schedule a follow-up appointment in 6 months. 2)  Flu vax 3)  Set the Advair aside for now unless really needed. if you find you are having to use the rescue inhaler Proair more than once a day routinely let me know and we will consider options. 4)  As you find you are feeling better we can re-evaluate the CPAP issue. 5)  Script for Diflucan/ fliuconazole for the thrush. Prescriptions: FLUCONAZOLE 150 MG  TABS (FLUCONAZOLE) 1 dailyu for yeast  #7 x 0   Entered and Authorized by:   Waymon Budge MD   Signed by:   Waymon Budge MD on 09/28/2009   Method used:   Print then Give to Patient   RxID:   [id number]  Flu Vaccine Consent Questions     Do you have a history of severe allergic reactions to this vaccine? no    Any prior history of allergic reactions to egg and/or gelatin? no    Do you have a sensitivity to the preservative Thimersol? no    Do you have a past history of Guillan-Barre Syndrome? no    Do you currently have an acute febrile illness? no    Have you ever had a severe reaction to latex? no    Vaccine information given and explained to patient? yes    Are you currently pregnant? no    Lot Number:[id number]   Exp Date:07/08/2010   Reynaldo Minium CMA  September 28, 2009 11:56 AM    Site Given  Right Deltoid IMy:   Waymon Budge MD on 09/28/2009   Method used:   Print then Give to Patient   RxID:   [id number]    .[email or web address]

## 2010-02-07 NOTE — Progress Notes (Signed)
Summary: rx request- cough  Phone Note Call from Patient Call back at Home Phone [phone]   Caller: Patient Call For: Nancy Cole Summary of Call: pt c/o dry cough- worse at night- keeping her from sleeping. she has had this cough for several weeks but now feels like she has a cold as well. denies fever/ chills. has been taking benadryl is requesting cough syrup. [facility] [phone]. call pt at home # Initial call taken by: Tivis Ringer, CNA,  March 04, 2009 10:11 AM  Follow-up for Phone Call        Pt states she has had dry cough for several weeks but is has gotten worse over last few days and it is worse at night when sh egoes to bed. Pt states she has tried benadryl and OTC cough syrup with no relief. SH eis requesting an rx for cough syrup. Please advise. Carron Curie CMA  March 04, 2009 10:36 AM allergies: PCN, SUlfa  Additional Follow-up for Phone Call Additional follow up Details #1::        Per CDY-Hydromet #250ml take 1 tsp every 6 hours as needed cough No refills.Reynaldo Minium CMA  March 04, 2009 11:14 AM   rx sent. pt aware.Carron Curie CMA  March 04, 2009 11:18 AM     New/Updated Medications: HYDROMET 5-1.5 MG/5ML SYRP (HYDROCODONE-HOMATROPINE) 1 teaspoon every 6 hours as needed Prescriptions: HYDROMET 5-1.5 MG/5ML SYRP (HYDROCODONE-HOMATROPINE) 1 teaspoon every 6 hours as needed  #200 ml x 0   Entered by:   Carron Curie CMA   Authorized by:   Waymon Budge MD   Signed by:   Carron Curie CMA on 03/04/2009   Method used:   [facility]       [facility]  [address]  [phone]* (retail)       [address] [place], [place]  [address]       Ph: [phone] or [phone]       Fax: [phone]   RxID:   [id number]

## 2010-02-07 NOTE — Assessment & Plan Note (Signed)
Summary: 3 mo rov/LC   Primary Provider/Referring Provider:  Jeremy Johann  CC:  Follow up visit-diarrhea x 3 weeks;slight SOB and wheezing. Recent Flu and URI.Marland Kitchen  History of Present Illness: November 30, 2008- Asthma, COPD Hot humid summer was hard on her and she blamed that for limiting dyspnea with any activity. She notes dry cough in last few days, no wheeze, chest pain, palpitation, swelling feet. Gets light headed on exercycle. Continues CPAP. She reports blood work was good with Dr Timothy Lasso. Flu vax 10/22/08  Jan 08, 2009- Asthma, COPD Still feels limited bvy dyspnea since colon cancer surgery. Weight last visit 158. Dyspnea with exertion. OK at rest and denies cough or congestion. Denbies known heart disease Had a lot of pneumnia, cough or wheeze when younger- no longer. Her father smoked heavily/ died COPD. Her A1AT test was NL.She did pulmonary rehab remotely. CXR-  basilar atelectasis, NAD - Only 192 m, stoped early sob and dizzy, w/ nl oxygenation 97%/ 95, 96% PFT- severe COPD w/ some response to dilator, Nl DLCO, airtrapping and hyperinflation  April 01, 2009- Asthma, COPD Diarrhea started 3-4 days after finishing doxycycline and persisted after kaopectate x 2 days. We referred to hx of colon surgery. Breathing is better and she is off prednisone. Sore across lower abdomen but not real pain like previous diverticulitis, no blood. No fever or chills.   Current Medications (verified): 1)  Synthroid 100 Mcg  Tabs (Levothyroxine Sodium) .... Take 1 Tablet By Mouth Once A Day 2)  Prilosec Otc 20 Mg  Tbec (Omeprazole Magnesium) .... Take 1 Capsule By Mouth Once A Day 3)  Avapro 300 Mg Tabs (Irbesartan) .... Take 1 By Mouth Once Daily 4)  Singulair 10 Mg  Tabs (Montelukast Sodium) .... Take 1 Tablet By Mouth Once A Day 5)  Advair Diskus 100-50 Mcg/dose  Misc (Fluticasone-Salmeterol) .... Use One Puff Two Times A Day Rinse Mouth After Each Use 6)  Cpap .... Set At 9; [facility] 7)   Proair Hfa 108 (90 Base) Mcg/act Aers (Albuterol Sulfate) .... 2 Puffs Four Times A Day As Needed 8)  Klor-Con 8 Meq Cr-Tabs (Potassium Chloride) .... Take 2 By Mouth Two Times A Day 9)  Cetirizine Hcl 10 Mg Tabs (Cetirizine Hcl) .... Take 1 By Mouth Once Daily 10)  Vitamin D 2000 Unit Tabs (Cholecalciferol) .... Take 1 By Mouth Once Daily 11)  Melatonin 5 Mg Tabs (Melatonin) .... Take 2 By Mouth At Bedtime 12)  Zoloft 50 Mg Tabs (Sertraline Hcl) .... Take 1 By Mouth Once Daily 13)  Spiriva Handihaler 18 Mcg Caps (Tiotropium Bromide Monohydrate) .Marland Kitchen.. 1 Daily 14)  Flagyl 500 Mg Tabs (Metronidazole) .... Take 1 Tablet By Mouth Three Times A Day For 7 Days  Allergies (verified): 1)  Penicillin G Potassium (Penicillin G Potassium) 2)  Sulfamethoxazole (Sulfamethoxazole)  Past History:  Past Medical History: Last updated: 01/29/2008 Asthma with COPD -was considered steroid dependent, kept on predisone 1976 until 2006 adenocarcioma "cyst", abdominal 2009 Osteoarthritis Sleep apnea NPSG AHI 17.9/hr. FEV1/FVC 05/29/05 0.43 No hx cardiac or coagulopathy degenerative disk disease  Family History: Last updated: Jan 08, 2009 Father died COPD Mother died MI Brother - has copd  Social History: Last updated: January 08, 2009 Patient never smoked.  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 Appendectomy Cholecystectomy Total Knee Arthroplasty-L Partial colectomy for diverticulitis -2009  Review of Systems      See HPI       The patient complains of abdominal pain.  The patient denies anorexia, fever, weight loss, weight gain, vision loss, decreased hearing, hoarseness, chest pain,  syncope, dyspnea on exertion, peripheral edema, prolonged cough, headaches, hemoptysis, muscle weakness, suspicious skin lesions, transient blindness, unusual weight change, abnormal bleeding, enlarged lymph nodes, and breast masses.    Vital Signs:  Patient profile:   76 year old female Height:      61 inches Weight:      153.13 pounds BMI:     29.04 O2 Sat:      96 % on Room air Pulse rate:   101 / minute BP sitting:   118 / 82  (left arm) Cuff size:   regular  Vitals Entered By: Reynaldo Minium CMA (April 01, 2009 9:03 AM)  O2 Flow:  Room air  Physical Exam  Additional Exam:  General: A/Ox3; pleasant and cooperative, NAD, calm SKIN: no rash, lesions NODES: no lymphadenopathy HEENT: [place]/AT, EOM- WNL, Conjuctivae- clear, PERRLA, TM-WNL, Nose- clear, Throat- clear and wnl,  Mallampatii III, dental repair NECK: Supple w/ fair ROM, JVD- none, normal carotid impulses w/o bruits Thyroid-  CHEST: Clear to P&A,dry cough once or twice- no rales, dullness or rhonchi, unlabored HEART: RRR, no m/g/r heard ABDOMEN: Soft and nl;  EAV:WUJW, nl pulses, no edema  NEURO: Grossly intact to observation       Impression & Recommendations:  Problem # 1:  DIARRHEA (ICD-787.91)  Concern is possible C.difficile vs simple antibiotic induced diarrhea or diverticulitis. We will get stool cx and start Flagyl.  Problem # 2:  ASTHMA (ICD-493.90) Breathing is currently comfortable. We discussed role of her meds related to her GI upset.  Other Orders: Est. Patient Level II (11914) T-Culture, C-Diff Toxin A/B ([id number])  Patient Instructions: 1)  Please schedule a follow-up appointment in 6 months. 2)  Lab 3)  After lab, start Flagyl

## 2010-02-07 NOTE — Miscellaneous (Signed)
Summary: Pulm Rehab Notes/[facility]  Pul[facility] Rehab Notes/[facility]   Imported By: Sherian Rein 03/17/2009 10:12:59  _____________________________________________________________________  External Attachment:    Type:   Image     Comment:   External Document

## 2010-02-07 NOTE — Miscellaneous (Signed)
Summary: Pulmonary Rehab/[place]  Pulmonary Rehab/[place]   Imported By: Sherian Rein 06/03/2009 10:58:44  _____________________________________________________________________  External Attachment:    Type:   Image     Comment:   External Document

## 2010-02-07 NOTE — Progress Notes (Signed)
Summary: diarrhea  Phone Note Call from Patient   Caller: Patient Call For: young Summary of Call: pt have diarrhea for 2 weeks think caused by doxycycline that dr young prescribed Initial call taken by: Rickard Patience,  March 28, 2009 10:08 AM  Follow-up for Phone Call        The patient c/o diarrhea since 03/10/2009. She finished the doxycycline on 03/10/2009 and then did a Pred taper. She has been taking Metamucil daily but would like CDY recs for the diarrhea. She does not want to take anything else without his okay. Please advise.  ALLERGIES:  PCN and Sulfa Follow-up by: Michel Bickers CMA,  March 28, 2009 10:42 AM  Additional Follow-up for Phone Call Additional follow up Details #1::        Per CDY, she can try Kaopectate as needed for the next few days. If it continues, she is to call back to let us know.Michel Bickers CMA  March 28, 2009 11:23 AM  The patient had verbal understanding of recs and she is sch to see CDY on Fri 04/01/2009 for 3 month rov.Michel Bickers CMA  March 28, 2009 11:29 AM

## 2010-02-07 NOTE — Letter (Signed)
Summary: New Patient letter  [facility]  [address] [place], [place] [address]   Phone: [phone]  Fax: [phone]       04/12/2009 MRN: [id number]  [facility] [address] [place], [place]  [address]  Dear Ms. Kathreen Devoid,  Welcome to the Gastroenterology Division at [facility].    You are scheduled to see Dr.  Jarold Motto on 05-13-09 at 10:00a.m. on the 3rd floor at [facility], [address]  We ask that you try to arrive at our office 15 minutes prior to your appointment time to allow for check-in.  We would like you to complete the enclosed self-administered evaluation form prior to your visit and bring it with you on the day of your appointment.  We will review it with you.  Also, please bring a complete list of all your medications or, if you prefer, bring the medication bottles and we will list them.  Please bring your insurance card so that we may make a copy of it.  If your insurance requires a referral to see a specialist, please bring your referral form from your primary care physician.  Co-payments are due at the time of your visit and may be paid by cash, check or credit card.     Your office visit will consist of a consult with your physician (includes a physical exam), any laboratory testing he/she may order, scheduling of any necessary diagnostic testing (e.g. x-ray, ultrasound, CT-scan), and scheduling of a procedure (e.g. Endoscopy, Colonoscopy) if required.  Please allow enough time on your schedule to allow for any/all of these possibilities.    If you cannot keep your appointment, please call [phone] to cancel or reschedule prior to your appointment date.  This allows [place] the opportunity to schedule an appointment for another patient in need of care.  If you do not cancel or reschedule by 5 p.m. the business day prior to your appointment date, you will be charged a $50.00 late cancellation/no-show fee.    Thank you for  choosing [facility] for your medical needs.  We appreciate the opportunity to care for you.  Please visit [place] at our website  to learn more about our practice.                     Sincerely,                                                             The Gastroenterology Division

## 2010-02-07 NOTE — Progress Notes (Signed)
Summary: restarted spiriva  Phone Note Call from Patient   Caller: Patient Call For: young Summary of Call: pt is using pro air more than one time daily would like to have spiriva prescript sent to [facility] Initial call taken by: Rickard Patience,  November 02, 2009 1:06 PM  Follow-up for Phone Call        called spoke with patient who c/o increased SOB, and dry cough x2weeks and continued PND and thrush since last ov.  states she has restarted the spiriva qd  when symptoms began and feels that her breathing has improved.  would like rx for this, or other recs.  would also like something for the thrush.  pt aware CDY not in office this afternoon, okay with call back tomorrow.  please advise, thanks!  ALLERGIES: pcn, sulfa Follow-up by: Boone Master CNA/MA,  November 02, 2009 2:11 PM  Additional Follow-up for Phone Call Additional follow up Details #1::        I have put [facility] script for Spiriva on med list and local script for Diflucan on local list Please send.  Suggest she try using her Advair before meals. Chewing and swallowing will help clean the medicine from her mouth so she is less likely to get thrush. Additional Follow-up by: Waymon Budge MD,  November 02, 2009 4:12 PM    Additional Follow-up for Phone Call Additional follow up Details #2::    called spoke with patient, informed her of CDY's recs.  pt verbalized her understanding.  pt stated she is not using the advair and has not since her last ov.  spiriva sent to [facility] and diflucan to verified local pharmacy. Boone Master CNA/MA  November 02, 2009 4:42 PM   Prescriptions: FLUCONAZOLE 150 MG TABS (FLUCONAZOLE) 1 dailyu for yeast  #7 x 0   Entered by:   Boone Master CNA/MA   Authorized by:   Waymon Budge MD   Signed by:   Boone Master CNA/MA on 11/02/2009   Method used:   [facility]        [facility]  [phone]* (retail)       [address]       [place], [place]  [address]       Ph: [phone] or  [phone]       Fax: [phone]   RxID:   [id number] SPIRIVA HANDIHALER 18 MCG CAPS (TIOTROPIUM BROMIDE MONOHYDRATE) 1 daily  #90 x 3   Entered by:   Boone Master CNA/MA   Authorized by:   Waymon Budge MD   Signed by:   Boone Master CNA/MA on 11/02/2009   Method used:   Electronically to        [facility]* (retail)             ,          Ph: [phone]       Fax: [phone]   RxID:   [id number] FLUCONAZOLE 150 MG TABS (FLUCONAZOLE) 1 dailyu for yeast  #7 x 0   Entered by:   Waymon Budge MD   Authorized by:   Pulmonary Triage   Signed by:   Waymon Budge MD on 11/02/2009   Method used:   Historical   RxID:   [id number] SPIRIVA HANDIHALER 18 MCG CAPS (TIOTROPIUM BROMIDE MONOHYDRATE) 1 daily  #90 x 3   Entered by:   Waymon Budge MD   Authorized by:   Pulmonary Triage  Signed by:   Waymon Budge MD on 11/02/2009   Method used:   Historical   RxID:   [id number]

## 2010-02-07 NOTE — Progress Notes (Signed)
Summary: diahrhhea  Phone Note Call from Patient Call back at Home Phone [phone]   Caller: Patient Call For: [facility] Summary of Call: Was told to call back if she still had diarhhea and she still does, she would like to speak with Florentina Addison about this and be advised what she may take, kaopectate is not helping. Denna Haggard, CMA  March 31, 2009 9:49 AM  Initial call taken by: Denna Haggard, CMA,  March 31, 2009 9:49 AM  Follow-up for Phone Call        This might be a C.diff diarrhea.  Need to send stool spec for C.diff assay ( I ordered), then start Flagyl 500 mg three times a day x 7 days, # 21. Follow-up by: Waymon Budge MD,  March 31, 2009 10:10 AM  Additional Follow-up for Phone Call Additional follow up Details #1::        called and spoke with pt.  pt aware of CY's recs.  Pt will come in tomorrow to lab to have stool culture.  pt also aware rx sent to pharmacy.  Aundra Millet Reynolds LPN  March 31, 2009 2:35 PM   New Problems: DIARRHEA (ICD-787.91)   New Problems: DIARRHEA (ICD-787.91) New/Updated Medications: FLAGYL 500 MG TABS (METRONIDAZOLE) Take 1 tablet by mouth three times a day for 7 days Prescriptions: FLAGYL 500 MG TABS (METRONIDAZOLE) Take 1 tablet by mouth three times a day for 7 days  #21 x 0   Entered by:   Arman Filter LPN   Authorized by:   Waymon Budge MD   Signed by:   Arman Filter LPN on 40/34/7425   Method used:   Electronically to        [facility]  [phone]* (retail)       [address]       [place], [place]  [address]       Ph: [phone] or [phone]       Fax: [phone]   RxID:   [id number]

## 2010-02-07 NOTE — Progress Notes (Signed)
Summary: still coughing  Phone Note Call from Patient Call back at Home Phone [phone]   Caller: Patient Call For: young Reason for Call: Talk to Nurse Summary of Call: re cough meds not helping. plz call back Initial call taken by: Denna Haggard, CMA,  March 16, 2009 11:10 AM  Follow-up for Phone Call        The patient c/o cough with clear, thick mucus, increased sob, occasional wheezing and PND.  She says her discolored mucus did improve on doxycycline. Her chest also feels tight. Please advise.  ALLERGIES:  PCN and sulfa [facility]  March 16, 2009 11:25 AM  Additional Follow-up for Phone Call Additional follow up Details #1::        Offer a prednisone taper  prednisone 10 mg, # 20, no ref 1 tab four times daily x 2 days, 3 times daily x 2 days, 2 times daily x 2 days, 1 time daily x 2 days  per CY.   pt advised rx sent. Carron Curie CMA  March 16, 2009 3:22 PM     New/Updated Medications: PREDNISONE 10 MG TABS (PREDNISONE) 1 tab four times daily x 2 days, 3 times daily x 2 days, 2 times daily x 2 days, 1 time daily x 2 days Prescriptions: PREDNISONE 10 MG TABS (PREDNISONE) 1 tab four times daily x 2 days, 3 times daily x 2 days, 2 times daily x 2 days, 1 time daily x 2 days  #20 x 0   Entered by:   Carron Curie CMA   Authorized by:   Waymon Budge MD   Signed by:   Carron Curie CMA on 03/16/2009   Method used:   Electronically to        [facility]  [phone]* (retail)       [address]       [place], [place]  [address]       Ph: [phone] or [phone]       Fax: [phone]   RxID:   [id number]

## 2010-02-07 NOTE — Letter (Signed)
Summary: [facility]   Imported By: Lester Lebanon 12/16/2009 11:05:17  _____________________________________________________________________  External Attachment:    Type:   Image     Comment:   External Document

## 2010-02-09 NOTE — Progress Notes (Signed)
Summary: prescription refill  Phone Note Call from Patient Call back at Home Phone [phone]   Caller: Patient Call For: DR YOUNG Summary of Call: Patient is having a continous cough and has a clear mucas sinus drainage. She [facility] day before yesterday to see if she could get a refill on her cough mediciine and they told her yesterday that they did not recieve a call back from our office. Patient would like a refill [facility]. She can be reached at [phone] Initial call taken by: Vedia Coffer,  January 06, 2010 11:57 AM  Follow-up for Phone Call        spoke with the pt and she is c/o dry cough and pnd x 1 mth.Pt is requestign a refill on hydromet cough syrup. Please advsie.Carron Curie CMA  January 06, 2010 12:12 PM  Follow-up by: Waymon Budge MD,  January 06, 2010 1:21 PM  Additional Follow-up for Phone Call Additional follow up Details #1::        I gave ok to [facility] to refill hydromet 200 ml, 1 teaspoon four times a day as needed cough,  Additional Follow-up by: Waymon Budge MD,  January 06, 2010 1:22 PM    Additional Follow-up for Phone Call Additional follow up Details #2::    called hydromet into pts pharmacy----called and spoke with pt and she is aware of rx called to the pharmacy Randell Loop CMA  January 06, 2010 2:12 PM   New/Updated Medications: * HYDROMET 1 tsp by mouth four times daily as needed for cough Prescriptions: HYDROMET 1 tsp by mouth four times daily as needed for cough  #266ml x 0   Entered by:   Randell Loop CMA   Authorized by:   Waymon Budge MD   Signed by:   Randell Loop CMA on 01/06/2010   Method used:   Telephoned to ...       [facility]  [address]  [phone]* (retail)       [address] [place], [place]  [address]       Ph: [phone] or [phone]       Fax: [phone]   RxID:   [id number]

## 2010-02-09 NOTE — Medication Information (Signed)
Summary: Hydrocodone [facility] # [id number]  Hydrocodone / [facility] # [id number]   Imported By: Lennie Odor 01/17/2010 14:59:08  _____________________________________________________________________  External Attachment:    Type:   Image     Comment:   External Document

## 2010-02-09 NOTE — Assessment & Plan Note (Signed)
Summary: six minute walk/pre op clearance//kcw   Nurse Visit   Vital Signs:  Patient profile:   76 year old female Pulse rate:   78 / minute BP sitting:   138 / 82  Allergies: 1)  Penicillin G Potassium (Penicillin G Potassium) 2)  Sulfamethoxazole (Sulfamethoxazole)  Orders Added: 1)  Pulmonary Stress (6 min walk) [94620]    Six Minute Walk Test Medications taken before test(dose and time): All AM meds at 730am. Supplemental oxygen during the test: No  Lap counter(place a tick mark inside a square for each lap completed) lap 1 complete  lap 2 complete   lap 3 complete   lap 4 complete  lap 5 complete   Baseline  BP sitting: 138/ 82 Heart rate: 78 Dyspnea ( Borg scale) 1 Fatigue (Borg scale) 0 SPO2 95  End Of Test  BP sitting: 140/ 84 Heart rate: 81 Dyspnea ( Borg scale) 2 Fatigue (Borg scale) 3 SPO2 97  2 Minutes post  BP sitting: 138/ 80 Heart rate: 85 SPO2 96  Stopped or paused before six minutes? No  Interpretation: Number of laps  5 X 48 meters =   240 meters+ final partial lap: 0 meters =    240 meters   Total distance walked in six minutes: 240 meters  Tech ID: Drucilla Schmidt Tech Comments Pt completed test without any breaks and 1 complaint of back pain. Pt walked with unsteady gate. Test results enter by Vivianne Spence test performed by Tivis Ringer.Reynaldo Minium CMA  January 13, 2010 10:41 AM

## 2010-02-09 NOTE — Assessment & Plan Note (Signed)
Summary: surgical clearance//jrc   Primary Provider/Referring Provider:  Jeremy Johann  CC:  Surgery Clearance-Due on 01-20-10 for Hernia Repair.Marland Kitchen  History of Present Illness: April 01, 2009- Asthma, COPD Diarrhea started 3-4 days after finishing doxycycline and persisted after kaopectate x 2 days. We referred to hx of colon surgery. Breathing is better and she is off prednisone. Sore across lower abdomen but not real pain like previous diverticulitis, no blood. No fever or chills.  September 28, 2009- Asthma, COPD, hx OSA Cites generalized malaise for not using CPAP saying she is resting well and breathing has been good. Complains of persistent thrush and says postnasal drip tastes like it. Being evaluated for persistent diarhea- Dr Dulce Sellar. Not using Advair regularly. Used rescue inhaler 0-2 or 3 x/ day, blaming the humid hot summer weather. Cites the thrush for not wanting to use the Advair regularly. Diflucan helped for awhile. Dry cough x few months- off Micardis now.  Not using Spiriva- not using, not helpful.  February 06, 2010  Asthma, COPD, hx OSA Nurse-CC: Surgery Clearance-Due on 01-20-10 for Hernia Repair. She needs preop clearance for right groin incisional hernia repair by Dr Derrell Lolling, GOT at [facility].  She feels stable but never feels she has any stamina. Dr Timothy Lasso put her on zoloft and increased dose, but no benefit. Fatigue easily with sustained walking. Denies wheeze but has nasal drip and persistent dry cough. Denies nodes, fever, sweat, or swelling. Denies chest pain, palpitation.  CPAP is used well every night at 9. Sometimes takes mask off in sleep, but she denies daytime sleepines or concern. Her exertional limitation is more like dyspnea than sleepiness.      Preventive Screening-Counseling & Management  Alcohol-Tobacco     Smoking Status: never     Passive Smoke Exposure: yes  Comments: Significant cigarette exposure growing up.  Current Medications (verified): 1)   Synthroid 100 Mcg  Tabs (Levothyroxine Sodium) .... Take 1 Tablet By Mouth Once A Day 2)  Prilosec Otc 20 Mg  Tbec (Omeprazole Magnesium) .... Take 1 Capsule By Mouth Once A Day 3)  Singulair 10 Mg  Tabs (Montelukast Sodium) .... Take 1 Tablet By Mouth Once A Day 4)  Cpap .... Set At 9; [facility] 5)  Proair Hfa 108 (90 Base) Mcg/act Aers (Albuterol Sulfate) .... 2 Puffs Four Times A Day As Needed 6)  Klor-Con 8 Meq Cr-Tabs (Potassium Chloride) .... Take 2 By Mouth Two Times A Day 7)  Cetirizine Hcl 10 Mg Tabs (Cetirizine Hcl) .... Take 1 By Mouth Once Daily 8)  Vitamin D 2000 Unit Tabs (Cholecalciferol) .... Take 1 By Mouth Once Daily 9)  Melatonin 5 Mg Tabs (Melatonin) .... Take 2 By Mouth At Bedtime 10)  Zoloft 50 Mg Tabs (Sertraline Hcl) .... Take 1 By Mouth Once Daily 11)  Spiriva Handihaler 18 Mcg Caps (Tiotropium Bromide Monohydrate) .Marland Kitchen.. 1 Daily 12)  Hydromet .Marland Kitchen.. 1 Tsp By Mouth Four Times Daily As Needed For Cough  Allergies (verified): 1)  Penicillin G Potassium (Penicillin G Potassium) 2)  Sulfamethoxazole (Sulfamethoxazole)  Past History:  Past Surgical History: Last updated: 04/01/2009 asthma/ copd Recurrent spontaneuous pneumothoraxs  L - finally had talc pleurodesis- 1979. Appendectomy Cholecystectomy Total Knee Arthroplasty-L Partial colectomy for diverticulitis -2009  Family History: Last updated: 02/06/10 Father died COPD- heavy smoker Mother died MI Brother - has copd- smoked  Social History: Last updated: 12/30/2008 Patient never smoked.  Retired- Writer at [employer] Positive history of passive tobacco smoke exposure- significant exposure  to parents, heavy smokers  Risk Factors: Smoking Status: never (01/13/2010) Passive Smoke Exposure: yes (01/13/2010)  Past Medical History: Asthma with COPD- PFT 2007- FEV1/FVC 43%                                - PFT 01/12/10- FEV1 1.0/ 51%; FVC 2.0/ 79%; FEV1/ FVC 0.48 -was considered steroid  dependent, kept on predisone 1976 until 2006 adenocarcioma "cyst", abdominal 2009 Osteoarthritis Sleep apnea NPSG AHI 17.9/hr. No hx cardiac or coagulopathy degenerative disk disease  Family History: Father died COPD- heavy smoker Mother died MI Brother - has copd- smoked  Review of Systems      See HPI       The patient complains of shortness of breath with activity.  The patient denies shortness of breath at rest, productive cough, non-productive cough, coughing up blood, chest pain, irregular heartbeats, acid heartburn, indigestion, loss of appetite, weight change, abdominal pain, difficulty swallowing, sore throat, tooth/dental problems, headaches, nasal congestion/difficulty breathing through nose, and sneezing.    Vital Signs:  Patient profile:   76 year old female Height:      61 inches Weight:      152.50 pounds BMI:     28.92 O2 Sat:      96 % on Room air Pulse rate:   87 / minute BP sitting:   124 / 82  (left arm) Cuff size:   regular  Vitals Entered By: Reynaldo Minium CMA (January 13, 2010 9:35 AM)  O2 Flow:  Room air CC: Surgery Clearance-Due on 01-20-10 for Hernia Repair.   Physical Exam  Additional Exam:  General: A/Ox3; pleasant and cooperative, NAD, calm SKIN: no rash, lesions NODES: no lymphadenopathy HEENT: [place]/AT, EOM- WNL, Conjuctivae- clear, PERRLA, TM-WNL, Nose- clear, Throat- thrush/ coated tongue,  Mallampatii III, dental repair NECK: Supple w/ fair ROM, JVD- none, normal carotid impulses w/o bruits Thyroid-  CHEST: Clear to P&A, no rales, dullness or rhonchi, unlabored HEART: RRR, no m/g/r heard ABDOMEN: Soft and nl;  AVW:UJWJ, nl pulses, no edema  NEURO: Grossly intact to observation       Impression & Recommendations:  Problem # 1:  OBSTRUCTIVE SLEEP APNEA (ICD-327.23)  Good compliance and control at 9 cwp. She will bring her mask to the hospital at the time of surgery,  but use their machine during her stay.   Problem # 2:  COPD  (ICD-496) She has severe COPD . Tested normal/ negative for alpha1 antitrypsin deficiency in past. Father smoked very heavily.  Little bronchospasm. She is clinically stable now from pulmonary standpoint, clear for pending surgery.  We will get office spirometry and 6 MWT.   PFT today- severe obstructive airways diease FEV1 1.0/ 51%; FVC 2.0/ 79%; FEV1/FVC 0.48.  Other Orders: Est. Patient Level IV ([address])  Patient Instructions: 1)  CC Dr Derrell Lolling , Dr Timothy Lasso 2)  Office spirometry 3)  6 MWT 4)  Script for Singulair 5)  Please schedule a follow-up appointment in 3 months. 6)     We will talk then about going back to pulmonary rehab 7)  You are "cleared" for surgery.  Prescriptions: SINGULAIR 10 MG  TABS (MONTELUKAST SODIUM) Take 1 tablet by mouth once a day  #90 Tablet x 3   Entered and Authorized by:   Waymon Budge MD   Signed by:   Waymon Budge MD on 01/13/2010   Method used:   Print then  Give to Patient   RxID:   [id number]

## 2010-02-09 NOTE — Progress Notes (Signed)
Summary: surgery 01/20/10 does she need sleep study prior  Phone Note Call from Patient Call back at Home Phone [phone]   Caller: Patient Call For: Dr. Maple Hudson Summary of Call: Patient phoned and is scheuled for surgery on 01/20/10 and wants to know if she needs to have another sleep study prior to her surgery. patient can be reached at [phone] Initial call taken by: Vedia Coffer,  January 10, 2010 2:29 PM  Follow-up for Phone Call        spoke with pt and advised according top last note from [facility] pt was tp f/u with CY in 4-6 weeks to discuss pending surgery. Pt set to see CY Friday the 6th at 9:30am.Jennifer [facility] CMA  January 10, 2010 4:39 PM

## 2010-02-23 NOTE — Letter (Signed)
Summary: Claud Kelp MD/[facility]  Claud Kelp MD/[facility]   Imported By: Lester Genesee 02/14/2010 09:42:56  _____________________________________________________________________  External Attachment:    Type:   Image     Comment:   External Document

## 2010-04-12 ENCOUNTER — Encounter: Payer: Self-pay | Admitting: Internal Medicine

## 2010-04-13 ENCOUNTER — Ambulatory Visit (INDEPENDENT_AMBULATORY_CARE_PROVIDER_SITE_OTHER): Payer: Medicare Other | Admitting: Internal Medicine

## 2010-04-13 ENCOUNTER — Encounter: Payer: Self-pay | Admitting: Internal Medicine

## 2010-04-13 VITALS — BP 124/80 | HR 80 | Ht 61.0 in | Wt 154.0 lb

## 2010-04-13 DIAGNOSIS — G4733 Obstructive sleep apnea (adult) (pediatric): Secondary | ICD-10-CM

## 2010-04-13 DIAGNOSIS — J449 Chronic obstructive pulmonary disease, unspecified: Secondary | ICD-10-CM

## 2010-04-13 DIAGNOSIS — J45909 Unspecified asthma, uncomplicated: Secondary | ICD-10-CM

## 2010-04-13 DIAGNOSIS — J310 Chronic rhinitis: Secondary | ICD-10-CM | POA: Insufficient documentation

## 2010-04-13 DIAGNOSIS — J4489 Other specified chronic obstructive pulmonary disease: Secondary | ICD-10-CM

## 2010-04-13 MED ORDER — CROMOLYN SODIUM 5.2 MG/ACT NA AERS: 1.0000 | INHALATION_SPRAY | Freq: Three times a day (TID) | NASAL | Status: DC

## 2010-04-13 NOTE — Assessment & Plan Note (Signed)
Good control

## 2010-04-13 NOTE — Assessment & Plan Note (Signed)
Brother and father had COPD. She reports a1AT test normal years ago.

## 2010-04-13 NOTE — Progress Notes (Signed)
  Subjective:    Patient ID: Nancy Cole, female    DOB: 1934-05-25, 76 y.o.   MRN: [id number]  HPI 30 yoF never smoker, followed here for asthma/ fixed obstruction, OSA and last seen January 13, 2010 for surgical clearance before hernia surgery, which went very well. CPAP 9 was dropped off after surgery. It began bothering her last year- couldn't tolerate anything on her face. She didn't want to bother, and the pressure felt wrong. I discussed recent study finding dementia in untreated older women with untreated OSA and she is willing to restart.  Heavy pollen is causing watery eyes ad nose.     Review of Systems See HPI Constitutional:   No weight loss, night sweats,  Fevers, chills, fatigue, lassitude. HEENT:   No headaches,  Difficulty swallowing,  Tooth/dental problems,  Sore throat,                CV:  No chest pain,  Orthopnea, PND, swelling in lower extremities, anasarca, dizziness, palpitations  GI  No heartburn, indigestion, abdominal pain, nausea, vomiting, diarrhea, change in bowel habits, loss of appetite  Resp: No shortness of breath with exertion or at rest.  No excess mucus, no productive cough,  No non-productive cough,  No coughing up of blood.  No change in color of mucus.  No wheezing  Skin: no rash or lesions.  GU: no dysuria, change in color of urine, no urgency or frequency.  No flank pain.  MS:  No joint pain or swelling.  No decreased range of motion.  No back pain.  Psych:  No change in mood or affect. No depression or anxiety.  No memory loss.      Objective:   Physical Exam General- Alert, Oriented, Affect-appropriate, Distress- none acute  Skin- rash-none, lesions- none, excoriation- none  Lymphadenopathy- none  Head- atraumatic  Eyes- Gross vision intact, PERRLA, conjunctivae clear, secretions  Ears- Normal Hearing, canals, Tm L ,   R ,  Nose- Clear, No- Septal dev, mucus, polyps, erosion, perforation   Throat- Mallampati II-III , mucosa  clear , drainage- none, tonsils- atrophic  Neck- flexible , trachea midline, no stridor , thyroid nl, carotid no bruit  Chest - symmetrical excursion , unlabored     Heart/CV- RRR , no murmur , no gallop  , no rub, nl s1 s2                     - JVD- none , edema- none, stasis changes- none, varices- none     Lung- clear to P&A, wheeze- none, cough- none , dullness-none, rub- none     Chest wall-   Abd- tender-no, distended-no, bowel sounds-present, HSM- no  Br/ Gen/ Rectal- Not done, not indicated  Extrem- cyanosis- none, clubbing, none, atrophy- none, strength- nl  Neuro- grossly intact to observation        Assessment & Plan:

## 2010-04-13 NOTE — Assessment & Plan Note (Signed)
She had dropped off CPAP, partly not bothering, but with suggestion it had gotten uncomfortable. We will autotitrate for pressure check.

## 2010-04-13 NOTE — Patient Instructions (Signed)
Try rinsing your nose with saline, for instance with a Neti pot  Try using nasalcrom/ cromolyn nasal spray- This is otc but I did send a script to your pharmacy.  Order- To [facility]-   [facility] to do autotitration for CPAP pressure check   X 1 week, 5-15 cwp.

## 2010-04-13 NOTE — Assessment & Plan Note (Signed)
Increase in symptoms with season suggests pollen allergy. I suggested saline rinse and trial of cromolyn otc.

## 2010-04-19 ENCOUNTER — Encounter: Payer: Self-pay | Admitting: Internal Medicine

## 2010-05-23 NOTE — Op Note (Signed)
Nancy Cole, Nancy Cole                ACCOUNT NO.:  [id number]   MEDICAL RECORD NO.:  [id number]          PATIENT TYPE:  INP   LOCATION:  0001                         FACILITY:  [facility]   PHYSICIAN:  Angelia Mould. Derrell Lolling, M.D.DATE OF BIRTH:  [birth year]   DATE OF PROCEDURE:  02/18/2007  DATE OF DISCHARGE:                               OPERATIVE REPORT   PREOPERATIVE DIAGNOSIS:  Recurrent diverticulitis.   POSTOPERATIVE DIAGNOSES:  1. Recurrent diverticulitis.  2. Malignant retroperitoneal cyst.   OPERATION PERFORMED:  1. Laparoscopic-assisted sigmoid colectomy with takedown of splenic      flexure.  2. Excision of left retroperitoneal cyst with frozen section.   SURGEON:  Dr. Claud Kelp.   ASSISTANT:  Adolph Pollack, M.D.   OPERATIVE INDICATIONS:  This is a 76 year old white female who has had a  history of diverticulitis for about 10 years.  She has significant  asthma and went on prednisone several years ago, but that has been  discontinued.  She gets an episode of diverticulitis about once a year.  She has never had an abscess that I know about.  She has been treated  multiple times.  She had a colonoscopy on November 26, 2006 which showed  diverticula throughout the entire colon, some tortuosity in the sigmoid  but no evidence of stricture.  The highest concentration of diverticular  openings was on the left side.  We did a CT scan for which showed simply  diffuse colonic diverticula concentrated primarily in the descending and  rectosigmoid but no lesion noted.  No other abnormalities were commented  on.  Single-column barium enema confirmed the concentration of sigmoid  diverticula in the sigmoid colon.  I advised the patient to undergo  elective excision.  She is brought to operating room electively  following a bowel prep at home.   OPERATIVE FINDINGS:  The patient had a focal area of thickening and  chronic inflammation in the sigmoid colon.  There were minimal  diverticula above this area and we were able to get well above and below  this and do an anastomosis with a stapler with healthy colon.  As we  were mobilizing the descending colon near the descending and sigmoid  junction, we encountered a retroperitoneal cystic structure.  We  dissected this free.  We discussed this with radiology and on further  review of the CT scan Dr. Purcell Mouton said that it was a 3 cm cystic  structure with calcifications in the wall.  He said that on review of  prior CTs it was noted back to 2003 and in fact it was smaller now that  it was then and so he felt that this was benign.  I chose to excise this  area.  The frozen section on the cyst by Dr. Jimmy Picket showed  adenocarcinoma of unknown primary.  The liver showed no evidence of any  cancer.  We did not feel any nodules in the liver.  The omentum felt  normal.  There was no adenopathy we could identify.  Her uterus and  ovaries were intact and looked benign.  We did take washing from the  pelvis.   OPERATIVE TECHNIQUE:  Following induction of general endotracheal  anesthesia, Foley catheter was placed and the patient was placed a  modified dorsal lithotomy position.  The abdomen and perineum were  prepped and draped in a sterile fashion.  Intravenous antibiotics were  given.  The patient was identified as correct procedure and correct  position and correct patient.   A 10 mm Hassan trocar was placed just above the umbilicus and secured in  place with a pursestring suture of 0-0 Vicryl.  Pneumoperitoneum was  created.  Video cam was inserted with visualization and findings as  described above.  I placed 11-mm trocar in suprapubic area in the  midline and a 5-mm trocar in the right midabdomen and a 5-mm trocar in  the left midabdomen.   After surveying the abdomen and pelvis, we placed the patient in  Trendelenburg position and we mobilized the distal descending colon and  sigmoid colon and proximal rectum.   We divided the lateral peritoneal  attachments.  We identified the left ureter and that was preserved.  Indigo carmine was given at the end of the case and showed up in the  urine but there is no evidence of any leak within the abdomen.  We fully  mobilize the sigmoid colon all the way down into the pelvis.  We also  incised the peritoneum on the right side of the rectosigmoid to further  mobilize this.  We then put the patient in reverse Trendelenburg  position and completely mobilize the descending colon and splenic  flexure using electrocautery and scissors.  We identified the cystic  structure in the left retroperitoneum.  It appeared to be about to be  about 2.5 x 3.5 cm with some white spots on the more cephalad end of it,  thought to be consistent with the calcifications on the rim.  I was able  to dissect this completely free from the retroperitoneum and colon  without any difficulty and place it in a specimen bag.  This was sent to  the lab.  Dr. Jimmy Picket examined this and said he found  adenocarcinoma of unknown primary.   At this point I scrubbed out and went to room 12 and spoke with Dr.  De Blanch, our GYN Oncologist.  I showed him the pictures of  the cyst, the CT scan, and gave him the history.  He said that would be  unusual for this to be a gynecologic malignancy.  He suggested that we  take pelvic washings but we did not think that omentectomy or any  further surgery was necessary at this time.  We felt that it would be  best to wait for the final pathology and then do a thorough oncologic  workup.   I scrubbed back in the operating room.  We checked our proximal and  distal limits of resection and felt we had enough mobilization.  We  released the pneumoperitoneum.  A lower midline incision was made about  8 cm in length.  The fascia was incised in midline and we entered the  peritoneal cavity.  We placed retractors and found that we could pull  the  sigmoid colon up quite nicely.  We could see the proximal and distal  limits of resection.  We divided the sigmoid mesentery with the LigaSure  device.  We transected the proximal and distal ends of the colon with a  GIA 75 stapler.  We placed  a silk suture in the distal line of resection  and sent the specimen for routine histology.  The anastomosis was  created between the distal descending colon and proximal rectum using a  GIA stapling device.  This stapling was inspected and found to be quite  intact.  There was no bleeding.  The defect in the bowel wall was closed  with TA 90 stapler on a 3.5 mm load.  We placed a few extra sutures to  reinforce the staple line at critical points.   At this point Dr. Avel Peace went down below and performed  proctoscopy.  He ran the proctoscope up and found the anastomosis  probably above 15 cm said that there was no bleeding from the  anastomosis.  We insufflated under pressure and there was no air leak.   We then changed our gowns, gloves and instruments.  We irrigated out the  abdomen and pelvis.  We took some washings at the beginning of this and  sent them for cytology.  We checked for bleeding elsewhere.  We saw that  there was no leakage of indigo carmine anywhere except in the Foley bag.   We closed the midline fascia with a running suture of #1 PDS and the  skin with skin staples.  We reinsufflated the abdomen.  We irrigated out  all the fluid in the right subphrenic space, left subphrenic space, left  paracolic gutter and pelvis.  There did not appear to be any active  bleeding anywhere.  We felt that everything was satisfactory.   The pneumoperitoneum was released.  The trocars were removed under  direct vision.  There is no bleeding from trocar sites.  The skin  incisions were closed with skin staples.  Clean bandages were placed and  the patient taken to recovery room in stable condition.  Estimated loss  was about 150 mL.   Complications none.  Sponge, needle instrument  counts were correct.      Angelia Mould. Derrell Lolling, M.D.  Electronically Signed     HMI/MEDQ  D:  02/18/2007  T:  02/19/2007  Job:  [id number]   cc:   Tasia Catchings, M.D.  Fax: [phone]   Gwen Pounds, MD  Fax: [phone]   Clinton D. Maple Hudson, MD, FCCP, FACP  [facility]  [address]  [place]  [place] [address]

## 2010-05-23 NOTE — Consult Note (Signed)
NAMEELMINA, HENDEL                ACCOUNT NO.:  [id number]   MEDICAL RECORD NO.:  [id number]          PATIENT TYPE:  INP   LOCATION:  1530                         [facility]ACILITY:  [facility]   PHYSICIAN:  Leighton Roach. Truett Perna, M.D. DATE OF BIRTH:  [birth year]   DATE OF CONSULTATION:  02/21/2007  DATE OF DISCHARGE:                                 CONSULTATION   REFERRING PHYSICIAN:  Angelia Mould. Derrell Lolling, M.D.   PATIENT IDENTIFICATION:  Ms.  Abbett is a 76 year old status post sigmoid  colectomy for treatment of diverticulitis.  She was found to have a  cystic left retroperitoneal mass at the time of surgery.  The mass was  removed, and the pathology reveals adenocarcinoma.   HISTORY OF PRESENT ILLNESS:  Ms. Waskey reports a chronic history of  diverticulitis.   She has been followed by Dr. Sherin Quarry.  She had episodes of  diverticulitis and September 2008 and October 2008.  A colonoscopy  November 26, 2006, showed diverticula throughout the entire colon with  mild tortuosity in the sigmoid colon.  No colitis was noted.  She was  referred to Dr. Derrell Lolling, and decision was made to proceed with an  elective laparoscopic sigmoid colectomy.  A preoperative CT scan showed  diverticulosis and thickening of the proximal sigmoid colon.   She was taken to the operating room on February 10.  A laparoscopic-  assisted sigmoid colectomy was performed.   A focal area of thickening was noted in the sigmoid colon.  As the  descending colon was immobilized, a retroperitoneal cystic mass was  noted.   The mass was excised.  A frozen section showed adenocarcinoma.  There  was no evidence for metastatic disease within the abdominal cavity.  The  uterus and ovaries appeared benign.   The pathology [facility] [phone]) confirmed a mucin-producing adenocarcinoma  arising in a preexisting retroperitoneal cyst.  The colon resection  revealed diverticulosis and diverticulitis.  There were two benign lymph  nodes.   The cytology  from peritoneal washings ([id number]) revealed no malignant  cells.   Ms. Kobel reports feeling well prior to surgery.   PAST MEDICAL HISTORY:  1. Asthma and COPD.  2. Hypertension.  3. History of a GI bleed in the 1980s.  4. G0, P0, menopause at age 76, not on hormone replacement.  5. Sleep apnea.  6. Spontaneous pneumothorax in 1979.   PAST SURGICAL HISTORY:  1. Status post left knee replacement in 2006.  2. Status post appendectomy in 1960.  3. Status post thoracotomy in 1979.  4. Cholecystectomy in 1998.   MEDICATIONS ON HOSPITAL ADMISSION:  1. Potassium chloride 8 mEq b.i.d.  2. Avapro 300 mg daily  3. Synthroid 100 mcg q.a.m.  4. Prilosec 20 mg q.a.m.  5. Singulair 10 mg at bedtime.  6. Zyrtec 10 mg bedtime.  7. Simvastatin 20 mg at bedtime.  8. Advair 10/50 one puff b.i.d.  9. Albuterol inhaler 2 puffs q. 4 h p.r.n.  10.Darvocet one q.4 h p.r.n.  11.Vitamin D daily.   ALLERGIES:  1. PENICILLIN.  2. SULFA.   FAMILY HISTORY:  No family history of cancer.   SOCIAL HISTORY:  She lives alone in [facility].  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  She does  not use tobacco.  She drinks alcohol occasionally.  She was transfused  with red blood cells with the knee placement in 2006.  She denies risk  factors for HIV and hepatitis.   REVIEW OF SYSTEMS:  CONSTITUTIONAL:  Negative.  RESPIRATORY:  Negative.  Her asthma is not active at present.  CARDIAC:  Negative.  GU:  Negative.  GI:  As per HPI.  NEUROLOGIC:  Negative.  SKIN:  Negative.  INFECTIOUS DISEASE:  Negative.   PHYSICAL EXAMINATION:  VITAL SIGNS:  Blood pressure 127/62, pulse 72,  temperature 98.2, oxygen saturation 97% on room air.  HEENT/NECK:  Neck without mass.  LUNGS:  Clear with distant breath sounds and a few end-inspiratory  rhonchi at the left base.  No respiratory distress.  CARDIAC:  Regular rhythm.  ABDOMEN:  Gauze dressings are in place at the  surgical incision sites.  The abdomen is soft and nontender.  No hepatomegaly.  EXTREMITIES:  No edema.  LYMPH NODES:  No palpable cervical, clavicular, axillary, or inguinal  nodes.  BREASTS:  Bilateral breasts without mass.   Labs from February 11:  CA19-9 was 27.9, CEA 1.6.  CA-125 was 9.6.  Labs  from February 5:  Hemoglobin 12.4, white count 6.6, platelets 303,000.  BUN 14, creatinine 0.67, calcium 9.2, albumin 3.8   IMPRESSION:  1. Adenocarcinoma involving a left retroperitoneal cyst.  2. Status post a sigmoid colectomy for treatment of diverticulitis.  3. Asthma.  4. Sleep apnea.  5. Hypertension   Ms. Eliasen underwent an elective sigmoid colectomy.  She was found to  have a cystic left retroperitoneal mass at the time of surgery.  The  pathology from the cyst confirms involvement with adenocarcinoma.   I reviewed the x-ray studies dating back to 2002.  The left  retroperitoneal cyst has been present chronically and was actually  decreased in size on the most recent CT scan.   The etiology of the cyst-adenocarcinoma is unclear.  This may be a  primary tumor related to an embryonic remnant, primary peritoneal  carcinoma, or adenocarcinoma arising in a benign abdominal cyst.   I see no indication for adjuvant therapy.  We will present her case at  the GI tumor conference.  I will research the literature regarding  cystic primary tumors in the abdomen and pelvis.   We will schedule a followup visit at the cancer center for within the  next 3-4 weeks.   Thank you very much this consultation.      Leighton Roach Truett Perna, M.D.  Electronically Signed     GBS/MEDQ  D:  02/21/2007  T:  02/23/2007  Job:  [id number]   cc:   Angelia Mould. Derrell Lolling, M.D.  [address]  [place]  [place] [address]   Tasia Catchings, M.D.  Fax: [phone]   Clinton D. Maple Hudson, MD, FCCP, FACP  [facility]  [address]  [place]  [place] [address]   Gretta Cool, M.D.  Fax: [phone]

## 2010-05-23 NOTE — Discharge Summary (Signed)
Nancy Cole, SAMARIN                ACCOUNT NO.:  [id number]   MEDICAL RECORD NO.:  [id number]          PATIENT TYPE:  INP   LOCATION:  1530                         [facility]ACILITY:  [facility]   PHYSICIAN:  Angelia Mould. Derrell Lolling, M.D.DATE OF BIRTH:  [birth year]   DATE OF ADMISSION:  02/18/2007  DATE OF DISCHARGE:  02/23/2007                               DISCHARGE SUMMARY   FINAL DIAGNOSIS:  1. Sigmoid colon diverticulitis.  2. 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  3. Asthma and chronic obstructive pulmonary disease.  4. Hypertension.  5. Remote history of GI bleed in the 1980s.  6. Sleep apnea.  7. Spontaneous pneumothorax in 1979.   OPERATIONS PERFORMED:  Laparoscopic-assisted sigmoid colectomy and  laparoscopic excision of left retroperitoneal cyst date February 18, 2007.   HISTORY:  This is a 76 year old white female who has had recurrent  attacks of sigmoid diverticulitis, and has been treated multiple times  by Dr. Shari Heritage.  She has significant asthma, and was on  prednisone several years ago, but that has been discontinued.  Colonoscopy performed in November 2008 showed diverticula throughout the  entire colon with tortuosity in the sigmoid,  no evidence of stricture.  The highest concentration of diverticula were on the left side.  A CT  scan showed diffuse colonic diverticula, but concentrated primarily in  the distal descending and rectosigmoid.  A single column barium enema  confirmed the significant concentration of sigmoid diverticula in the  sigmoid colon.  The patient was offered elective resection which she  wanted to have done.  She underwent bowel prep at home, and was brought  to operating room electively.   HOSPITAL COURSE:  On the date of admission the patient taken to  operating room, and underwent a laparoscopic assisted sigmoid colectomy.  We had excellent mobilization of the colon.  We were  able to do a  significant resection of the disease process into a stapled anastomosis  through as an extracorporeal anastomosis.  An unexpected finding was a  cystic mass in the left retroperitoneal area behind the distal  descending colon.  This was not attached to the colon or the ureter or  any other structure, and was removed.  The uterus and ovaries looked  normal.   Pathology report showed diverticulosis of the colon.  The left  retroperitoneal cyst showed a mucin producing adenocarcinoma.  Dr.  Delila Spence postulated that this might be a primary adenocarcinoma arising  Mullerian duct cyst remnant, or a primary carcinoma arising in an  ovarian remnant.   The patient was counseled regarding the unexpected finding.  This cystic  mass actually was present on her CT scan, previously, but had been  getting smaller and was not even commented on the report of her recent  scan, since it had been getting smaller.  This was discussed with  radiology.   I asked Dr. Mancel Bale to see her, and he did so and scheduled her  for a followup appointment at the [facility] with him, to  decide how  to best manage this unusual finding.   In terms of her colon resection, she did well.  She advanced in her diet  and activities without too much trouble, resumed diet, resumed bowel  function.  She had no problems with her hypertension or her sleep apnea  during the admission.  She was discharged on February 23, 2007.  At that  time her wound was healthy.  Her abdomen was soft.  Followup appoint  with me in 7-14 days was arranged.   DISCHARGE MEDICATIONS INCLUDE:  1. Vicodin for pain.  She was advised to continue all of her usual medications which include  1. Potassium 8 mEq twice a day.  2. Avapro 300 mg a day.  3. Synthroid 100 mcg a day.  4. Prilosec 20 mg a day.  5. Singulair 10 mg at bedtime.  6. Zyrtec 10 mg at bedtime.  7. Simvastatin 20 mg at bedtime.  8. Advair 100/50 one puff  twice daily.  9. Albuterol inhaler p.r.n.  10.Darvocet N 100 p.r.n.  11.Vitamin D daily.      Angelia Mould. Derrell Lolling, M.D.  Electronically Signed     HMI/MEDQ  D:  03/09/2007  T:  03/10/2007  Job:  [id number]   cc:   Tasia Catchings, M.D.  Fax: [phone]   Clinton D. Maple Hudson, MD, FCCP, FACP  [facility]-P[facility]  [address]  [place]  [place] [address]   Gretta Cool, M.D.  Fax: [phone]   Leighton Roach Truett Perna, M.D.  Fax: [phone]

## 2010-05-26 NOTE — Discharge Summary (Signed)
NAMEMARRIANNE, [name]:  [id number]   MEDICAL RECORD NO.:  [id number]          PATIENT TYPE:  INP   LOCATION:  5032                         FACILITY:  [facility]   PHYSICIAN:  Gwen Pounds, MD       DATE OF BIRTH:  May 05, 1934   DATE OF ADMISSION:  03/18/2004  DATE OF DISCHARGE:  03/22/2004                                 DISCHARGE SUMMARY   PRIMARY CARE Helene Bernstein:  Gwen Pounds, M.D.   PULMONOLOGIST:  Rennis Chris. Maple Hudson, M.D.   ORTHOPEDIC SURGEON:  Claude Manges. Cleophas Dunker, M.D.   DISCHARGE DIAGNOSES:  1.  Left lower lobe community-acquired pneumonia.  2.  Steroid-dependent asthma.  3.  History of thoracotomy post spontaneous pneumothorax back in the 1970s.  4.  Hypertension.  5.  Hypothyroid.  6.  Obstructive sleep apnea, on continuous positive airway pressure.  7.  History of diverticulitis.  8.  History of colonic arteriovenous malformations.  9.  Status post bilateral cataract surgery.  10. Status post cholecystectomy.  11. History of liver and renal cysts.  12. History of impaired fasting glucose.   DISCHARGE MEDICATION LIST:  1.  Prednisone 5 mg tablets 3 p.o. daily x 2 days, 2 tablets p.o. daily x 3      days, and 1 p.o. daily and thereafter as directed by Dr. Jetty Duhamel.  2.  Synthroid 88 mg daily.  3.  Albuterol up to 2 puffs q.i.d.  4.  Advair 100/50 b.i.d.  5.  Zyrtec 10 mg p.o. at bedtime.  6.  Singulair 10 mg p.o. at bedtime.  7.  Avapro 300 mg p.o. daily.  8.  Micro-K b.i.d.  9.  Prilosec 20 mg p.o. daily.  10. Darvocet p.r.n.  11. CPAP as directed.  12. Tessalon Perles 100 mg p.o. q.8 h. p.r.n. cough.  13. Incentive spirometry q.1 h. while awake.  14. Avelox 400 mg p.o. daily for seven more days.   HISTORY OF PRESENT ILLNESS:  Briefly, this is a 76 year old female with  steroid-dependent asthma who since January 2006 has been developing upper  respiratory tract infections with cough, sputum production, wheezing, as is  her usual over the  winter.  She took two to three rounds of antibiotics over  that time frame.  Unfortunately, over the last week to two weeks, she has  worsened to the point where she called in to her primary care Omri Bertran on  Saturday March 18, 2004 with high fevers, coughing, sputum production, and  feeling of extreme weakness.  She was instructed to go on to the emergency  department.  In the emergency department, she was determined to have  community-acquired pneumonia, but it is unclear whether there was also an  element of influenza.  She was admitted for further evaluation and  treatment.   HOSPITAL COURSE:  Nancy Cole was admitted through the emergency department  on March 18, 2004 with community-acquired pneumonia, questionable influenza,  fever greater than 101, white count greater than 27,000, and significant  bronchospasm noted.  Again, she was admitted.  She was placed on  antibiotics, Tamiflu,  oxygen, nebulizer treatments, and blood cultures were  obtained.  I saw her on March 20, 2004, and on her repeat chest x-ray she  had resolving bibasilar pneumonia, and we elected for the next couple days  to continue on with the elevated doses of prednisone, continue with  nebulizer treatments, continue with aggressive pulmonary toilette, and  continue on with antibiotics including the Tamiflu.  She finished a five-day  course of the Tamiflu.  She is going to complete a full 10-day course of  antibiotics.  When the coughing calmed down, she was placed back on her  CPAP.  Her blood sugars were relatively well controlled except for one or  two that went above 150, but she never required insulin.  She was kept on  all of her home medications, and nothing else became an issue.  Her  leukocytosis dropped from a high of 27,400 down to 14,100 on discharge.  On  March 22, 2004, she was clinically improved on all oral medications.  She  was deemed medically ready for discharge.  She will follow up with me in  10-  14 days for a repeat chest x-ray, and follow up with Dr. Jetty Duhamel as  directed, and she hopefully will be completely ready for her upcoming  surgery with Dr. Cleophas Dunker on April 11, 2004.  If she sees me and is doing  well before that point, I will have no problem with clearing her.      JMR/MEDQ  D:  03/22/2004  T:  03/22/2004  Job:  [id number]   cc:   Gwen Pounds, MD  Fax: [phone]   Clinton D. Maple Hudson, M.D.   Claude Manges. Cleophas Dunker, M.D.  [address] [place]  [place] [address]  Fax: [phone]

## 2010-05-26 NOTE — H&P (Signed)
Nancy Cole, Nancy Cole                ACCOUNT NO.:  [id number]   MEDICAL RECORD NO.:  [id number]          PATIENT TYPE:  INP   LOCATION:  NA                           FACILITY:  [facility]   PHYSICIAN:  Claude Manges. Whitfield, M.D.DATE OF BIRTH:  1934-09-30   DATE OF ADMISSION:  DATE OF DISCHARGE:                                HISTORY & PHYSICAL   DATE OF ADMISSION:  April 11, 2004   CHIEF COMPLAINT:  Left knee pain since 1998.   HISTORY OF PRESENT ILLNESS:  This 76 year old white female patient presented  to Dr. Cleophas Dunker with a history of left knee pain since about 1998. She did  have a left knee arthroscopy by done by him in 1998 and has done well until  just a little while ago. She says she has probably had pain for about 10  years and it was gradually getting progressively worse. She has had no known  injury to her knee, but she said it was really sent off when in November of  this past year she was doing a lot of traveling and she was all of a sudden  having very severe pain in her knee. She received a cortisone injection in  her knee some time in November or December and that has helped tremendously.   At this point the knee pain is described as an intermittent throbbing to  very sharp sensation, mostly over the medial joint line or diffuse about the  joint with no radiation at this time. In November and December, however, the  pain did radiate all the way up and down her leg. Pain increases with  activity and then decreases with rest and the use of Darvocet. She does  complain of the knee was slipping, locking, popping, catching, grinding and  giving way at times. The knee also is swollen and occasionally keeps her up  at night. She has only received one cortisone injection and that has gotten  rid of most of sharp pain she was having. She did receive Hyalgan injections  several years ago and that also helps She is not currently ambulating with  any assistive devices.   ALLERGIES:  1.  PENICILLIN causes a rash.  2.  SEPTRA causes a rash.   CURRENT MEDICATIONS:  1.  Prednisone since 1979 5 mg one tablet p.o. q.a.m..  2.  Synthroid 88 mcg one tablet p.o. q.a.m.Marland Kitchen  3.  Avapro 300 mg one tablet p.o. q.a.m.Marland Kitchen  4.  Micro-K 8 mEq two tablets p.o. b.i.d..  5.  Zyrtec 10 mg one tablet p.o. q.a.m..  6.  Singulair 10 mg one tablet p.o. q.a.m.Marland Kitchen  7.  Prilosec 20 mg one tablet p.o. q.a.m..  8.  Albuterol inhaler two puffs inhaled q.i.d. p.r.n..  9.  Advair discus 100/50 one puff inhaled b.i.d.Marland Kitchen  10. Darvocet N 100 one to two tablets p.o. q.6 h p.r.n. for pain.  11. Calcium 1500 mg one tablet p.o. q.a.m.Marland Kitchen  12. Ibuprofen one to two tablets p.o. q.4-6 h p.r.n. for pain.   PAST MEDICAL HISTORY:  1.  Hypertension diagnosed in the 1980s.  2.  Hypothyroidism diagnosed when she was in her 90s.  3.  Asthma  - she had asthma as a child, it resolved as a teenager, and      returned at age 76. She has been steroid-dependent since about 1979.  4.  COPD.  5.  History of pneumonia March 11-15, 2006.  6.  Sleep apnea treated with a C-PAP.  7.  Gastroesophageal reflux disease for the last 5-6 years.  8.  Diverticulosis with history of diverticulitis three times in the past,      most recently in November 2004.   She denies any history of diabetes mellitus, hiatal hernia, peptic ulcer  disease, heart disease, or any other chronic medical condition other than  noted previously.   PAST SURGICAL HISTORY:  1.  Appendectomy by Dr. Gay Filler in 1960.  2.  Thoracotomy by Dr. Nedra Hai in 1979.  3.  Laparoscopic cholecystectomy in the 1990s.  4.  Left knee arthroscopy by Dr. Claude Manges. Whitfield in 1995.  5.  Tonsillectomy at age 53.  6.  Removal of cataracts bilateral eyes.  7.  Gap procedure to her left eye in 2006.   She denies any complications from the above-mentioned procedures.   SOCIAL HISTORY:  She denies any history of cigarette smoking, alcohol use,  or drug use. She is divorced and does  not have any children. She lives by  herself in a one-story house with three steps into the main entrance. She is  a retired Writer. Her medical doctor is Dr. Creola Corn at  [facility].   FAMILY HISTORY:  Mother died at age of 45 with hypertension and ischemic  heart disease. Father died at age 105 with COPD. She has two living brothers  age 70 and 39 with the older one with respiratory disease and the younger  one with sleep apnea and heart disease. She did have one brother who died at  age of 4 due to a car accident.   REVIEW OF SYSTEMS:  The last time she was hospitalized for her asthma was in  the 1980s. She has never been intubated. She was hospitalized for lower lobe  pneumonia, community acquired, March 11-15, 2006. She has been cleared for  surgery by Dr. Timothy Lasso. She does have problems with chronic sinus infections.  She has degenerative disk disease in her lumbar spine. She does wear a  partial plate dentures on her upper jaw line. She does wear glasses. She  does have some shortness of breath with exertion and some urinary frequency.  She does not have a Living Will at this time and her power of attorney is  Christen Butter, who is her brother. All other systems are negative and  noncontributory.   PHYSICAL EXAMINATION:  GENERAL:  Well-developed, well-nourished, mildly  overweight white female in no acute distress. Talks easily with examiner.  Walks without a limp. Mood and affect are appropriate. Height 5 feet 1 inch,  weight 160 pounds, BMI is 29.5.  VITAL SIGNS: Temperature 98.9 degrees Fahrenheit, pulse 96, respirations 69,  and BP 138/64.  HEENT:  Normocephalic, atraumatic, without frontal or maxillary sinus  tenderness to palpation. Conjunctivae pink. Sclerae anicteric. PERLA. EOMs  intact. No visible external ear deformities. Hearing grossly intact. Large amount of cerumen in the left ear canal. The TMs are otherwise pearly gray   with good light reflex. Nose and nasal septum midline. Nasal mucosa pink and  moist without exudates or polyps noted. Buccal mucosa pink  and moist.  Dentition in good repair. Pharynx without erythema or exudates. Tongue and  uvula midline. Tongue without fasciculations. Uvula rises easily with  phonation.  NECK: No visible masses or lesions noted. Trachea midline. No palpable  lymphadenopathy or thyromegaly. Carotids +2 bilaterally without bruits. Full  range of motion and nontender to palpation along the cervical spine.  CARDIOVASCULAR:  Heart rate and rhythm regular. S1-S2 present without rubs,  clicks or murmurs noted.  RESPIRATORY:  Respirations even and unlabored. Breath sounds clear to  auscultation bilaterally without rales or wheezes noted.  ABDOMEN:  Colon rounded abdominal contour. Bowel sounds present x4  quadrants. Soft, nontender to palpation without hepatosplenomegaly or CVA  tenderness. Femoral pulses +2 bilaterally. Nontender to palpation along the  vertebral column.  BREAST/GENITOURINARY/RECTAL/PELVIC: These exams deferred at this time.  MUSCULOSKELETAL: No obvious deformities bilateral upper extremities with  full range of motion of these extremities without pain. Radial pulses are +2  bilaterally. She has full range of motion of her hips, ankles and toes  bilaterally. DP and PT pulses are +2. No calf pain with palpation. Negative  Homans' sign bilaterally.   Right knee:  Skin intact without erythema or ecchymosis. She has full  extension and flexion to 145 degrees without pain or crepitus. No pain with  palpation along the joint line. No effusion. Stable to varus and valgus  stress. Negative anterior drawer. Left knee:  Skin intact without erythema  or ecchymosis. She has full extension but flexion only to 120 degrees with a  large amount of patellofemoral crepitus with range of motion. She is tender  to palpation on the medial and lateral joint line, medial more so  than  lateral. Minimal effusion in the knee. Stable to varus and valgus stress.  Negative anterior drawer.  NEUROLOGIC: Alert and oriented x3. Cranial nerves II-XII are grossly intact.  Strength 5/5 bilateral upper and lower extremities. Rapid alternating  movements intact. Deep tendon reflexes 2+ bilateral upper and lower  extremities. Sensation intact to light touch.   RADIOLOGIC FINDINGS:  X-rays taken in November or December 2005 show  tricompartmental arthritis involving the medial compartment most  significantly.   IMPRESSION:  1.  End-stage osteoarthritis left knee.  2.  Steroid-dependent asthma/chronic obstructive pulmonary disease.  3.  History of pneumonia, March 2006.  4.  Sleep apnea treated with C-PAP.  5.  Hypertension.  6.  Hypothyroidism.  7.  Gastroesophageal reflux disease.  8.  Diverticulosis.  9.  Degenerative disk disease lumbar spine.  PLAN:  Ms. Woolworth will be admitted to [facility] on April 11, 2004  where she will undergo a left total knee arthroplasty by Dr. Claude Manges.  Whitfield. She will undergo all the routine preoperative laboratory tests  and studies prior to this procedure. If she has any medical issues while she  is hospitalized we will consult Dr. Maple Hudson and/or Dr. Timothy Lasso.      KED/MEDQ  D:  04/04/2004  T:  04/04/2004  Job:  [id number]

## 2010-05-26 NOTE — Procedures (Signed)
Nancy Cole, Nancy Cole                ACCOUNT NO.:  [id number]   MEDICAL RECORD NO.:  [id number]          PATIENT TYPE:  OUT   LOCATION:  SLEEP CENTER                 FACILITY:  [facility]   PHYSICIAN:  Clinton D. Maple Hudson, MD, FCCP, FACPDATE OF BIRTH:  12/07/36   DATE OF STUDY:  11/05/2005                              NOCTURNAL POLYSOMNOGRAM   REFERRING PHYSICIAN:  Clinton D. Maple Hudson, M.D.   DATE OF STUDY:  November 05, 2005.   INDICATION FOR STUDY:  Insomnia with sleep apnea.   EPWORTH SLEEPINESS SCORE:  12/24.   BMI:  27.3.   WEIGHT:  145 pounds.   HOME MEDICATIONS:  Zyrtec, Singulair, albuterol, Advair, Prilosec, Darvocet,  Micro-K, Synthroid.   Prior diagnostic studies on February 06, 2000 had recorded an AHI of 35 per  hour with CPAP titration to 9 CWP.  Study on January 19, 2004, AHI was 39.8,  CPAP 9 CWP.  Weight 151 pounds.  A split study protocol was requested.  The  patient states she has lost 40 pounds noting the current reported weight was  145 pounds, 6 pounds less than the 2006 study.   SLEEP ARCHITECTURE:  Total sleep time 312 minutes with sleep efficiency 71%.  Stage I was 5%, stage II 61%, stages III and IV 15%, REM 19% of total sleep  time.  Sleep latency 41 minutes, REM latency 81 minutes, awake after sleep  onset 76 minutes.  Arousal index 28.8.  Zyrtec was taken at bedtime.  The  patient stated that hip pain disturbed her because she did not take her pain  medications.   RESPIRATORY DATA:  Apnea/hypopnea index (AHI, RDI) 17.9 obstructive events  per hour indicating moderate obstructive sleep apnea/hypopnea syndrome.  There were 50 obstructive apneas, 4 mixed apneas and 39 hypopneas.  Events  were more common while supine.  REM AHI 35.6 per hour.  There were  insufficient early events to permit CPAP titration by split study protocol.  Events appeared particularly clustered during REM.  Frequent brief  awakenings through the night otherwise were not as clearly  related to  respiratory episodes.   OXYGEN DATA:  Moderate snoring with oxygen desaturation to a nadir of 87%.  Mean oxygen saturation through the study was 96% on room air.   CARDIAC DATA:  Normal sinus rhythm.   MOVEMENT-PARASOMNIA:  A total of 333 limb jerks were recorded of which 57  were associated with arousal or awakening for a significant periodic limb  movement with arousal index of 10.9 per hour.   IMPRESSIONS-RECOMMENDATIONS:  1. Moderate obstructive sleep apnea/hypopnea syndrome.  AHI 17.9 per hour      with events more common while supine, moderate snoring, oxygen      desaturation to 87%.  2. Last prior diagnostic NPSG on January 19, 2004 had recorded an AHI of      39.8 per hour with successful CPAP titration then to 9 CWP.  Recorded      weight at that visit was 151 pounds.  Home CPAP may require      retitration.  3. Sleep was also disturbed by hip pain according to the patient who did  not take her usual analgesic medication.  4. Periodic limb with arousal 10.9 per hour which may justify specific      therapy.      Clinton D. Maple Hudson, MD, [facility], FACP  Diplomate, Biomedical engineer of Sleep Medicine  Electronically Signed     CDY/MEDQ  D:  11/11/2005 09:56:37  T:  11/12/2005 01:59:12  Job:  [id number]

## 2010-05-26 NOTE — Discharge Summary (Signed)
Nancy Cole, Nancy Cole                ACCOUNT NO.:  [id number]   MEDICAL RECORD NO.:  [id number]          PATIENT TYPE:  INP   LOCATION:  5040                         FACILITY:  [facility]   PHYSICIAN:  Claude Manges. Whitfield, M.D.DATE OF BIRTH:  02-08-1934   DATE OF ADMISSION:  04/11/2004  DATE OF DISCHARGE:  04/14/2004                                 DISCHARGE SUMMARY   ADMISSION DIAGNOSES:  1.  End-stage osteoarthritis left knee.  2.  Chronic obstructive pulmonary disease/asthma steroid-dependent.  3.  History of pneumonia, March 2006.  4.  Hypertension.  5.  Hypothyroidism.  6.  Gastroesophageal reflux disease.  7.  Diverticulosis.  8.  Sleep apnea.  9.  Degenerative disk disease lumbar spine.   DISCHARGE DIAGNOSES:  1.  End-stage osteoarthritis, left knee, status post left total knee      arthroplasty.  2.  Acute blood loss anemia secondary to surgery.  3.  Asthma/chronic obstructive pulmonary disease.  4.  History of pneumonia, March 2006.  5.  Hypertension.  6.  Hypothyroidism.  7.  Gastroesophageal reflux disease.  8.  Diverticulosis.  9.  Sleep apnea.  10. Degenerative disk disease lumbar spine.   SURGICAL PROCEDURE:  On April 11, 2004, Nancy Cole underwent a total knee  arthroplasty by Dr. Norlene Campbell assisted by Lysle Pearl, P.A.-C.  She  had a DePuy MBT keel tibial tray cemented size 3 with an LCS complete  primary femoral component cemented size standard plus left.  An LCS complete  RP insert size standard plus 10 mm thickness and an LCS complete metal back  cemented size standard plus.   COMPLICATIONS:  None.   CONSULTATIONS:  1.  Pharmacy consult for Coumadin therapy, April 11, 2004, including internal      medicine consult by Dr. Timothy Lasso.  2.  Physical therapy and rehab medicine consult, April 12, 2004.  3.  Respiratory therapy and case management consulted, April 13, 2004.   HISTORY OF PRESENT ILLNESS:  This 76 year old white female presented to Dr.  Cleophas Dunker  with an 8-year history of gradual onset progressive left knee  pain.  She has a history of a left knee scope in 1998 and no other injury.  The pain is now an intermittent throbbing to sharp sensation diffuse about  the joint line with history of radiation up and down the leg.  The pain  increases with weight bearing and decreases with her rest and Darvocet.  The  knee slips, locks, pops, catches, grinds, gives way, swells and occasionally  keeps her up at night.  She has failed conservative treatment and x-rays  show end-stage arthritic changes.  Because of this she is presenting for  left knee replacement.   HOSPITAL COURSE:  Nancy Cole tolerated her surgical procedure well without  immediate postoperative complications.  She was transferred to 5000.  Her  medical doctor saw her first postoperative night and recommended steroid  taper and stress dose steroids.  Postoperative day one she was afebrile and  vitals stable.  Hemoglobin 9.8, hematocrit 28.9.  The leg was  neurovascularly intact and she was started  on therapy per protocol.   She did well over the next several days.  She was able to be switched to  p.o. medications on April 13, 2004.  Hemoglobin dropped to 9.1 with a  hematocrit of 26.2.  She was a little hypotensive at 91/58 and her pulse was  120 so she was subsequently transfused with one unit of packed red blood  cells.   On April 14, 2004 she was feeling better.  She was successful in having a  bowel movement.  She was afebrile and vitals stable.  Hemoglobin improved to  10, hematocrit 28.9.  It is felt she is ready for transfer to rehab and will  be transferred to rehab later today.   DISCHARGE INSTRUCTIONS:  Diet:  Continue her current hospitalization diet  with adjustments to be made per the rehab physicians.   DISCHARGE MEDICATIONS:  Continue current hospitalization medications with  adjustments to be made per the rehab physicians.  These include:  1.  Colace 100 mg  p.o. b.i.d.  2.  Coumadin dose to be adjusted by pharmacy q.6h. p.m. x1 month.  3.  Prednisone 5 mg p.o. q.a.m.  4.  Protonix 40 mg p.o. q.a.m.  5.  Singular 10 mg p.o. q.a.m.  6.  Avapro 300 mg p.o. q.a.m.  7.  Claritin 10 mg p.o. q.a.m.  8.  Synthroid 88 mcg one tablet p.o. q.a.m.  9.  Micro-K 8 mEq tablets 16 mEq p.o. b.i.d.  10. Advair Diskus 100/50 one puff inhaled b.i.d.  11. Heparin 3000 unit subcu q.12h. until Coumadin therapeutic.  12. OxyContin 10 mg one tablet q.12h.  13. Senokot-S one tablet p.o. p.r.n. constipation.  14. Enema of choice and laxative of choice p.r.n.  15. Phenergan 12.5-25 mg p.o. IM or IV or p.r. q.4h. p.r.n. for nausea.  16. Percocet 5/325 mg 1-2 tablets p.o. q.4h. p.r.n. breakthrough pain.  17. Skelaxin 400 mg 1-2 tablets p.o. q.6h. p.r.n. for spasms.  18. Albuterol multidose inhaled 90 mcg two puffs inhaled q.i.d. p.r.n.  19. Tylenol 1-2 tablets p.o. q.4h. p.r.n. temperature greater than 101.5.   ACTIVITY:  She can be out of bed partial weightbearing 50% on the left leg  with use of a walker.  She is to continue PT and OT per rehab protocols.   WOUND CARE:  Please clean the left knee incision with Betadine daily and  apply a dry dressing.  Please notify Dr. Cleophas Dunker if temperature greater  than 101.5, chills, pain unrelieved by pain medications or foul-smelling  drainage from the wound.  Staples can be removed with Steri-Strips with  Benzoin applied on postoperative day 14.  That can be done in rehab if she  is still there, otherwise she needs followup with Dr. Cleophas Dunker at about  that time for staple removal and first postoperative check.   FOLLOWUP:  She needs to follow up with Dr. Cleophas Dunker in our office in  approximately 14 days after surgery if she is out of rehab and staples are  still in place.  If staples are removed while she is in rehab then we can  have her follow up with Dr. Cleophas Dunker in approximately a week or two after her discharge  from rehab.   LABORATORY DATA:  Chest x-ray done April 11, 2004 showed no acute  cardiopulmonary process.  Chest x-ray done March 22, 2004 showed mild  improvement in left lobe infiltrate but no change in the right lower lobe  atelectasis.   On April 12, 2004,  hemoglobin 9.8, hematocrit 28.9.  On April 06, 2004  platelets 429.  On April 13, 2004, hemoglobin 9.1, hematocrit 26.2 and  platelets 227.  On April 14, 2004, white count 10.8, hemoglobin 10,  hematocrit 28.9 and platelets 221.   On April 14, 2004, PT 16.8, INR 1.6.   On April 12, 2004, glucose 123.  On April 11, 2004, potassium 3.4, glucose  133, calcium 7.9.  All other laboratory studies were within normal limits.      KED/MEDQ  D:  04/14/2004  T:  04/14/2004  Job:  [id number]   cc:   Gwen Pounds, MD  Fax: [phone]

## 2010-05-26 NOTE — Op Note (Signed)
NAMENAYELY, Nancy Cole                ACCOUNT NO.:  [id number]   MEDICAL RECORD NO.:  [id number]          PATIENT TYPE:  INP   LOCATION:  2854                         FACILITY:  [facility]   PHYSICIAN:  Claude Manges. [place], M.D.DATE OF BIRTH:  12/22/1934   DATE OF PROCEDURE:  04/11/2004  DATE OF DISCHARGE:                                 OPERATIVE REPORT   PREOPERATIVE DIAGNOSIS:  End-stage osteoarthritis, left knee.   POSTOPERATIVE DIAGNOSIS:  End-stage osteoarthritis, left knee.   PROCEDURE:  Left total knee replacement.   SURGEON:  Claude Manges. Cleophas Dunker, M.D.   ASSISTANT:  Jamelle Rushing, P.A.-C.   ANESTHESIA:  General endotracheal.   COMPLICATIONS:  None.   COMPONENTS:  DePuy LCS standard plus femoral component, #3 rotating keeled  tibial tray, a 10 mm bridging bearing, a metal-backed rotating three-pegged  patella.  All were secured with poly methyl methacrylate.   DESCRIPTION OF PROCEDURE:  With the patient comfortable on the operating  room table and under general orotracheal anesthesia, the nursing staff  inserted a Foley catheter.  The left lower extremity was then placed in a  thigh tourniquet.  The leg was prepped with Betadine scrub and the DuraPrep  and the tourniquet to the mid-foot.  Sterile draping was performed.  With  the extremity still elevated, it was Esmarched and exsanguinated with the  proximal tourniquet at 350 mmHg.   A midline longitudinal incision was made and centered about the patella,  extending from the superior part of the tibial tubercle where a sharp  dissection incision was carried down to the subcutaneous tissue.  The first  layer of capsule was incised in the midline.  A medial para-patellar  incision was then made through the deep capsule.  There was minimal clear  yellow joint effusion.  The patella was everted 180 degrees and the knee  flexed 90 degrees.  There was complete absence of articular cartilage in the  medial compartment involving  both femoral condyle and tibial plateau, and  large osteophytes along the lateral and medial femoral condyle and tibial  plateau.  Osteophytes were removed.  A synovectomy was performed.   Computer navigation was utilized.  Two Shantz pins were then placed in the  distal femur and two in the mid-tibia.  The arrays were then applied and  tightened.  The computer indicated that we had excellent visualization.  We  then morphed the tibia and femur to obtain the appropriate sizes for both  the femur and the tibia.  Based on computer navigation measurements, the  first cut was made on the tibia with the 7-degree of posterior declination.  At that point the tibial array was bumped and it changed position, and  accordingly we abandoned the computer and continued the rest of the  procedure with conventional instruments.  The Shantz pins were removed  without difficulty.  We obtained symmetrical flexion and extension gaps with  10 mm.  The MCL and LCL remained intact with the operative procedure.  The  tibial jigs were then applied to pin the appropriate tibial cuts, again  maintaining a flexion  extension gap of 10 mm.  A 4-degree distal femoral  valgus cut was utilized.  The lamina spreaders were inserted to remove so  that we could visualize the joint, removing medial and lateral menisci and  any remnants of ACL and PCL.  Osteophytes were removed from the lateral and  medial femoral condyle.  A retractor was then placed about the tibia.  We  did measure a #3 tibial tray, based on the computer measurements.  The  center cut was made, followed by the keel D-cuts.  The trial tibial tray was  then applied, followed by the 10 mm bridging bearing and the standard plus  femoral component through a full range of motion with excellent position and  full extension and flexion, well beyond 125 degrees, again without  instability.  The patella measured approximately 21 mm in width.  We removed  8 mm, leaving  a 13 mm thickness.  Three holes were then made.  The trial  patella was then applied through a full range of motion.  There was no  subluxation.  The trial components were removed.  The joint was then  copiously irrigated with jet saline.  The final components were secured with  poly methyl methacrylate.  The #3 rotating tibial tray was applied.  The  extraneous methacrylate was removed.  The 10 mm bridging bearing was  applied, followed by a standard plus femoral component and the three-pegged  rotating patella.  Any extraneous methacrylate was removed.  After complete  maturation, the joint was again inspected and any remaining hard  methacrylate was removed with an osteotome.  The joint was again irrigated  with saline solution.  The tourniquet was deflated with gross bleeders Bovie  coagulated.  The Hemovac was inserted.  The deep capsule was closed with a  #1 interrupted Ethibond.  The superficial capsule was closed with a #0  running Vicryl, the subcutaneous with #2-0 Vicryl.  The skin was closed with  skin clips.  A sterile bulky dressing was applied, followed by the patient's  support stocking.   The patient tolerated the procedure without complications.      PWW/MEDQ  D:  04/11/2004  T:  04/11/2004  Job:  [id number]

## 2010-05-26 NOTE — Discharge Summary (Signed)
NAME:  Nancy Cole, Nancy Cole                          ACCOUNT NO.:  [id number]   MEDICAL RECORD NO.:  [id number]                   PATIENT TYPE:  INP   LOCATION:  3733                                 FACILITY:  [facility]   PHYSICIAN:  Gwen Pounds, M.D.                 DATE OF BIRTH:  November 01, 1934   DATE OF ADMISSION:  01/13/2002  DATE OF DISCHARGE:  01/16/2002                                 DISCHARGE SUMMARY   DISCHARGE DIAGNOSES:  1. Right upper lobe community-acquired pneumonia.  2. Steroid-dependent asthma.  3. Dehydration with tachycardia.  4. Hypertension.  5. Hypothyroidism.  6. Obstructive sleep apnea on BiPAP.  7. Diverticulitis.  8. History of arteriovenous malformations.  9. Status post bilateral cataract surgery.  10.      Status post cholecystectomy.  11.      Liver and renal cyst.  12.      History of impaired fasting glucose.   DISCHARGE MEDICATIONS:  1. Prednisone taper 20 mg q.d. for 3 days, 10 mg q.d. x 3 days, and then 5     mg q.d. as instructed by Dr. Joni Fears D. Young.  2. Tequin 40 mg p.o. q.d. for 10 more days.  3. Zyrtec 10 mg q.d.  4. Synthroid 0.88 mcg q.d.  5. Micro-K 16 mg b.i.d.  6. Singulair 10 mg q.h.s.  7. Tylenol extra strength 2 t.i.d.  8. Pulmicort 2 puffs b.i.d.  9. Albuterol p.r.n.  10.      Altace 10 mg q.d.  11.      Darvocet p.r.n.   PROCEDURE:  Chest x-ray which showed right upper lobe community-acquired  pneumonia.   HISTORY OF PRESENT ILLNESS:  The patient is a 76 year old female, with  steroid-dependent asthma and obstructive sleep apnea, who presented to my  outpatient clinic with two weeks of fever, body aches, coughing with green  sputum and congestion.  She had recently been treated with Tamiflu.  She  also had a Z-Pak as an outpatient.  In the days leading up to the admission,  she had high fevers up to 105.0, diaphoresis, shortness of breath, pleuritic-  like chest pain, as well as the above symptoms.  In the office, her heart  rate was between 132 and 160.  Her blood pressure was 140/90 and temperature  was 102.5.  EKG was taken and she was in sinus tachycardia.   She was admitted for further evaluation of fever, dehydration, failure to  thrive, and tachypnea.  She was admitted to telemetry.   HOSPITAL COURSE:  The patient was sent through the emergency department to  be admitted directly secondary to no beds on the floor.  Chest x-ray done in  the emergency department revealed a right upper lobe community-acquired  pneumonia.  She was started on IV Tequin and given burst of Solu-Medrol and  she improved quickly.  Three days into her course, she was  switched over to  oral antibiotics and was allowed to go home on January 16, 2002 in stable  condition.  Her lungs sounded remarkably better.   She was originally in sinus tachycardia with a heart rate in the 130s to  160s.  This was felt to be secondary to severe dehydration.  She got several  liters of fluid.  She started making good urine and her heart rate came  down;  therefore, the telemetry was discontinued.   Her BiPAP was on hold while she was having major coughing fits and she will  restart the BiPAP at home.  All other medical conditions were stable except  for the fact that she did have a significant leukocytosis with a white count  of 33,000, 95% segs.  This was prior to her steroid bolus.  This was  repeated at it was 33.1 thousand and this will be repeated as an outpatient.   FOLLOW UP:  She will follow up with me next week and we will recheck a chest  x-ray and CBC at that time.   ACTIVITY:  She is to slowly return to her baseline activity.   DIET:  Regular diet.   CONDITION ON DISCHARGE:  Stable.                                               Gwen Pounds, M.D.    JMR/MEDQ  D:  01/16/2002  T:  01/17/2002  Job:  [id number]   cc:   Joni Fears D. Young, M.D.  [address] [place]  [place] [address]  Fax: [phone]

## 2010-05-26 NOTE — H&P (Signed)
NAMESARAI, JANUARY                ACCOUNT NO.:  [id number]   MEDICAL RECORD NO.:  [id number]          PATIENT TYPE:  ORB   LOCATION:  4527                         FACILITY:  [facility]   PHYSICIAN:  Ranelle Oyster, M.D.DATE OF BIRTH:  [birth year]   DATE OF ADMISSION:  04/14/2004  DATE OF DISCHARGE:                                HISTORY & PHYSICAL   MEDICAL RECORD NUMBER:  Is [id number].   CHIEF COMPLAINT:  Left knee pain.   HISTORY OF PRESENT ILLNESS:  This is a 76 year old white female who had  osteoarthritis of the left knee, failing conservative measures.  Ultimately,  she underwent a left total knee replacement by Dr. Cleophas Dunker on April 11, 2004.  The patient was placed on heparin to Coumadin for DVT prophylaxis.  She is partial weightbearing on the left leg.  She has had some problems  with pain control and was placed on OxyContin yesterday, 10 mg q.12h.  The  patient was transfused one unit packed red blood cells for a hemoglobin of  9.1.  She is maintained on CPAP for sleep apnea.  The patient is min assist  for basic mobility and self care.  She is able to ambulate 3  feet with a  rolling walker.  She is admitted to the subacute unit for rehab so that she  may meet modified independent goals.   REVIEW OF SYSTEMS:  Positive for shortness of breath, cough, low back pain,  weakness, left knee swelling.  Other review of systems are in the written  H&P and are negative.   PAST MEDICAL HISTORY:  1.  COPD on prednisone.  2.  Hypothyroidism.  3.  Gastroesophageal reflux disease.  4.  Sleep apnea on CPAP.  5.  Hypertension.  6.  Cholecystectomy.  7.  Appendectomy.  8.  Thoracotomy in 1979.  9.  Bilateral cataracts.   FAMILY HISTORY:  Noncontributory.   SOCIAL HISTORY:  [place]  one level home with three steps to enter.  Brother can assist  intermittently.  She was independent prior to arrival.   MEDICATIONS PRIOR TO  ADMISSION:  Prednisone, Avapro, Synthroid, Prilosec,  Micro-K, Singulair, Zyrtec, albuterol/Advair b.i.d. to q.i.d.   ALLERGIES:  1.  PENICILLIN.  2.  SEPTRA.   PERTINENT LABS:  Include a hemoglobin 10.0.  INR 1.6.  Sodium 135, potassium  3.4, chloride 106, bicarb 26, BUN and creatinine 7 and 0.8.   PHYSICAL EXAMINATION:  VITAL SIGNS:  The patient's blood pressure 116/70,  pulse is 97, temperature 97.8, respiratory rate 20.  GENERAL:  The patient is alert and oriented x 3.  HEENT:  Pupils are equal, round and reactive to light accommodation.  Extraocular eye movements are intact.  Oral mucosa is pink and moist.  NECK:  Supple without JVD or adenopathy.  CHEST:  Clear to auscultation bilaterally without wheezes, rales, or  rhonchi.  HEART:  Regular rate and rhythm without murmurs, rubs or gallops.  ABDOMEN:  Soft, nontender.  MUSCULOSKELETAL:  The left knee is swollen at the knee trace to  1+.  The  knee had slight serosanguineous discharge.  It was appropriately tender.  The range of motion was grossly 70 degrees with flexion.  She had 4+ to 5  out of 5 strength in both upper extremities, 4+ in the right lower  extremity, 2 to 2+ proximally left lower extremity  and 4+ out of 5  distally.  NEUROLOGIC:  Cranial nerve exam was intact.  Reflexes and sensation were  normal.  Cognitively, the patient was appropriate and displayed normal mood.   ASSESSMENT AND PLAN:  1.  Functional deficit secondary to left knee arthritis, status post total      knee replacement.  Begin subacute level rehab.  Goals are modified      independent mobility and self care.  Estimated length of stay is 7+      days.  Prognosis is good.  2.  Pain management with OxyContin 10 mg every 12 hours and OxyIR 5-10 mg      every four to six hours as needed.  Add Flexeril 5 mg every eight hours      as needed for spasms.  3.  Deep vein thrombosis prophylaxis with Coumadin.  We will cover with      Lovenox 30 mg every  12 hours until INR is greater than 2.0.  4.  Sleep apnea/chronic obstructive pulmonary disease.  Continue prednisone      and CPAP.  5.  Hypertension.  Monitor on Avapro.      ZTS/MEDQ  D:  04/14/2004  T:  04/14/2004  Job:  [id number]

## 2010-05-26 NOTE — Procedures (Signed)
Nancy Cole, Nancy Cole                ACCOUNT NO.:  [id number]   MEDICAL RECORD NO.:  [id number]          PATIENT TYPE:  OUT   LOCATION:  SLEEP CENTER                 FACILITY:  [facility]   PHYSICIAN:  Clinton D. Maple Hudson, M.D. DATE OF BIRTH:  1934-04-29   DATE OF STUDY:  01/19/2004                              NOCTURNAL POLYSOMNOGRAM   INDICATION FOR STUDY:  Hypersomnia with obstructive sleep apnea.  NPSG on  02/06/00 reported RDI of 45 per hour with CPAP titration to 9 CWP.  Epworth  sleepiness score 6/24, BMI 27.4, weight 151 pounds.   SLEEP ARCHITECTURE:  Total sleep time 277 minutes with sleep efficiency 64%.  Stage one was 14%, stage two 65%, stages three and four 13%.  REM was 8% of  total sleep time.  Sleep latency was 40 minutes.  REM latency 74 minutes.  Awake after sleep onset 114 minutes.  Arousal index 39.   RESPIRATORY DATA:  Split study protocol.  RDI 39.8 obstructive events per  hour indicating moderate to severe obstructive sleep apnea before CPAP.  This included 53 obstructive apneas, 2 mixed apneas and 33 hypopneas before  CPAP.  Events were not positional.  REM RDI 78 per hour.  CPAP was titrated  to 9 CWP, RDI 0 per hour.  A small Respironics comfort nasal mask was used  with humidifier and chin strap.   OXYGEN DATA:  Moderate snoring with oxygen desaturation to a nadir of 75%,  lowest oxygen saturation with a respiratory event was 88%.  Mean oxygen  saturation on CPAP was 95-98.   CARDIAC DATA:  Occasional PAC.   MOVEMENT/PARASOMNIA:  A total of 81 limb jerks were reported of which 37  were associated with arousal awakening for a periodic limb movement with  arousal index of 8 per hour which is increased.   IMPRESSION/RECOMMENDATION:  Moderately severe obstructive sleep  apnea/hypopnea syndrome, RDI 39.8 per hour with oxygen desaturation to 76%.  CPAP titration to 9 CWP, RDI 0 per hour, using a small Respironics comfort  mask.  Technician added chin strap and  humidifier.  Periodic limb movement  with arousal noted at 8 per hour.                                                           Clinton D. Maple Hudson, M.D.  Diplomate, [employer]   CDY/MEDQ  D:  01/23/2004 10:57:34  T:  01/23/2004 12:01:23  Job:  [id number]

## 2010-05-26 NOTE — Discharge Summary (Signed)
NAMEOLIE, SCAFFIDI                ACCOUNT NO.:  [id number]   MEDICAL RECORD NO.:  [id number]          PATIENT TYPE:  ORB   LOCATION:  4527                         FACILITY:  [facility]   PHYSICIAN:  Erick Colace, M.D.DATE OF BIRTH:  Sep 30, 1934   DATE OF ADMISSION:  04/14/2004  DATE OF DISCHARGE:  04/20/2004                                 DISCHARGE SUMMARY   DISCHARGE DIAGNOSES:  1.  Left total knee arthroplasty, April 11, 2004 secondary to degenerative      joint disease.  2.  Pain management.  3.  Coumadin for deep vein thrombosis prophylaxis.  4.  Sleep apnea with chronic obstructive pulmonary disease.  5.  Hypothyroidism.  6.  Gastroesophageal reflux disease.  7.  Hypertension.   HISTORY OF PRESENT ILLNESS:  A 76 year old white female admitted April 11, 2004 with end-stage changes of the left knee and no relief with conservative  care.  Underwent a left total knee arthroplasty April 11, 2004 per Dr.  Cleophas Dunker.  Placed on Coumadin for deep vein thrombosis prophylaxis and 50%  partial weightbearing.  PCA morphine discontinued April 13, 2004.  Placed on  controlled OxyContin 10 mg every 12 hours.  Postoperative anemia 9.1,  transfused one units of packed red blood cells April 13, 2004.  Noted sleep  apnea with CPAP with respiratory therapy followup.  The patient was admitted  to subacute care services.   PAST MEDICAL HISTORY:  See discharge diagnoses.   ALLERGIES:  PENICILLIN AND SEPTRA.   SOCIAL HISTORY:  Lives alone, [place], one-level home, three steps to  entry, brother checks as needed.   MEDICATIONS PRIOR TO ADMISSION:  1.  Prednisone 5 mg daily.  2.  Avapro 300 mg daily.  3.  Synthroid 88 mcg daily.  4.  Prilosec 20 mg daily.  5.  Micro-K 16 mEq b.i.d.  6.  Singulair two tablets q.h.s.  7.  Zyrtec daily.  8.  Advair b.i.d.   HOSPITAL COURSE:  The patient with progressive gains while on rehab services  with therapies initiated on a daily basis.  The  following issues were  followed during the patient's rehab course:  Pertaining to Mrs. Runkel's left  total knee replacement April 13, 2004, surgical site healing nicely, no signs  of infection, she would follow up with Dr. Cleophas Dunker for removal of staples.  She was ambulating extended household distances, partial weightbearing.  Her  endurance was somewhat limited due to her chronic obstructive pulmonary  disease, oxygen saturations 92% on room air.  Overall, she was minimal  assist for her bed mobility and minimal assist transfers, ambulating 150  feet, close supervision for car transfers, simple set up for activities of  daily living, home health therapies have been arranged.  Pain management  ongoing with the use of OxyContin sustained release 10 mg every 12 hours  which would be tapered and oxycodone for breakthrough pain.  She remained on  Coumadin for deep vein thrombosis prophylaxis, latest INR of 1.9.  She had  remained on subcutaneous Lovenox until INR greater than 2.  She continued  with  her CPAP machine for sleep apnea as prior to hospital admission as well  as chronic prednisone therapy.  Hormone supplement for her hypothyroidism.  Blood pressure controlled with Avapro with diastolic pressures of 64 to 78.  She was discharged to home in stable condition.   Latest labs showed an INR of 1.9, hemoglobin 10.6, hematocrit 30.3, sodium  138, potassium 4.5, BUN 14, creatinine 0.7.   DISCHARGE MEDICATIONS:  1.  Coumadin with latest dose of 7.5 mg, adjust accordingly, to be completed      on May 11, 2004.  2.  OxyContin sustained release 20 mg q.12h. x1 week, then 10 mg q.12h. x1      week, and discontinue.  3.  Advair 100/50 one puff b.i.d.  4.  Prednisone 5 mg daily.  5.  Protonix 40 mg daily.  6.  Singulair 10 mg daily.  7.  Avapro 300 mg daily.  8.  Claritin 10 mg daily.  9.  Synthroid 88 mcg daily.  10. Potassium chloride 16 mEq b.i.d.  11. Flexeril 5 mg t.i.d.  12.  Oxycodone for breakthrough pain.   ACTIVITY:  Partial weightbearing with walker.   DIET:  Regular.   WOUND CARE:  Follow up with Dr. Cleophas Dunker in 3 to 5 days for removal of  staples, call if any increased in redness, drainage or fever.   Home health nurse per Genevieve Norlander to complete Coumadin protocol.  Home health  physical and occupational therapy arranged.  She would follow up with Dr.  Cleophas Dunker as advised, Dr. Timothy Lasso medical management and Dr. Jetty Duhamel,  pulmonary services.      DA/MEDQ  D:  04/19/2004  T:  04/19/2004  Job:  [id number]   cc:   Joni Fears D. Maple Hudson, M.D.   Gwen Pounds, MD  Fax: [phone]   Claude Manges. Cleophas Dunker, M.D.  [address] [place]  [place] [address]  Fax: [phone]

## 2010-05-26 NOTE — H&P (Signed)
NAME:  Nancy Cole, Nancy Cole                          ACCOUNT NO.:  [id number]   MEDICAL RECORD NO.:  [id number]                   PATIENT TYPE:  EMS   LOCATION:  [facility]                                 FACILITY:  [facility]   PHYSICIAN:  Gwen Pounds, M.D.                 DATE OF BIRTH:  1934/09/27   DATE OF ADMISSION:  01/13/2002  DATE OF DISCHARGE:                                HISTORY & PHYSICAL   PRIMARY CARE [place]:  Dr. Gwen Pounds.   PRIMARY PULMONOLOGIST:  Dr. Joni Fears D. Young.   CHIEF COMPLAINT:  Fever, dehydration, failure to thrive, tachypnea.   HISTORY OF PRESENT ILLNESS:  [age]-year-old female with steroid-  dependent asthma, hypertension, obstructive sleep apnea, here with almost  two weeks of fever, body aches, coughing with green sputum and congestion.  She called Dr. Tera Mater. [place] prior to the [place] and was diagnosed with  flu and placed on Tamiflu.  She developed diarrhea, which got better after  the Tamiflu was discontinued, and she developed green sputum, which was felt  to be secondary to infection and she was treated with Z-Pack.  Over the last  two days, she developed a hard chill, high fever up to 105, diaphoresis,  shortness of breath, congestion, pleuritic chest pain, very dry and  developed extreme fatigue.  She has been trying to stay hydrated but has not  been very successful.  She is here today, appearing very ill, needing  admission.  She also describes nausea and vomiting, has vomited this a.m.  and last night.   PAST MEDICAL HISTORY:  Steroid-dependent asthma, hypertension, hypothyroid,  obstructive sleep apnea, on BiPAP, diverticulitis, history of AVMs, status  post bilateral cataract surgery, status post cholecystectomy and liver and  renal cysts.   ALLERGIES:  PENICILLIN and SULFA.   SOCIAL HISTORY:  She is divorced, no children.  She is a disabled Web designer.  No tobacco.  No alcohol.  She is accompanied by her sister-in-  law  today.   FAMILY HISTORY:  Father died of COPD.  Mother died of ischemic heart  disease.   MEDICATIONS:  1. Prednisone 5 mg daily.  2. Zyrtec 10 mg daily.  3. Aldomet 500 mg one-half b.i.d.  4. Synthroid 0.88 daily.  5. Micro-K 16 mEq b.i.d.  6. Singulair 10 mg q.h.s.  7. Tylenol Extra-Strength two t.i.d.  8. Pulmicort two puffs b.i.d.  9. Albuterol p.r.n.  10.      Altace 10 mg daily.  11.      Darvocet p.r.n.   REVIEW OF SYSTEMS:  See history of present illness.  No urinary symptoms.  No ear, nose and throat symptoms.  She has had nausea, vomiting and diarrhea  lately.  No hematochezia.  No hematemesis.  There has been cough with green  sputum and hemoptysis.  She is complaining of a headache and back pain.  PHYSICAL EXAMINATION:  VITAL SIGNS:  Temperature 102.5, heart rate between  130 and 160, blood pressure 140/90, respiratory rate 93% on room air.  GENERAL:  Ill-appearing, tachypneic.  EENT:  Sunken eyes.  Oropharynx is dry.  PULMONARY:  Very congested, tachypneic.  CARDIAC:  Tachycardic.  ABDOMEN:  No acute abdomen.  Some tenderness secondary to the coughing.  EXTREMITIES:  No edema.  Deep tendon reflexes 2+ throughout.  She is very  weak when walking down the hall.   LABORATORY AND ACCESSORY CLINICAL DATA:  EKG shows a sinus tachycardia, no  ischemia noted.   ASSESSMENT AND PLAN:  This is an elderly female with fever, dehydration,  failure to thrive, tachypnea, probable pneumonia and post influenza.   PLAN:  1. Admit.  2. Telemetry bed.  Check one set of enzymes.  Tachycardia is probably     secondary to dehydration.  Place on telemetry and follow.  3. Probable pneumonia:  Check chest x-ray, cultures and start antibiotics     including Tequin IV and continue oxygen.  4. Steroid-dependent asthma:  Give stress dose of steroids.  Consult Dr.     Maple Hudson.  Continue nebulizers and inhalers.  Avoid BiPAP until less cough.  5. Hypertension:  Hold ACE inhibitor, only the  Aldomet, if systolic blood     pressure greater than 130.  6. Phenergan for nausea.  7. Rule out influenza.  8. IV fluids -- aggressive -- for her underlying dehydration.                                               Gwen Pounds, M.D.    JMR/MEDQ  D:  01/13/2002  T:  01/13/2002  Job:  [id number]   cc:   Joni Fears D. Young, M.D.  [address] [place]  [place] [address]  Fax: [phone]   Tera Mater. Evlyn Kanner, M.D.  [address]  [place]  [place] [address]  Fax: [phone]

## 2010-05-26 NOTE — Procedures (Signed)
[name], Nancy Cole                ACCOUNT NO.:  [id number]   MEDICAL RECORD NO.:  [id number]          PATIENT TYPE:  OUT   LOCATION:  SLEEP CENTER                 [facility]ACILITY[facility]  [facility]   PHYSICIAN:  Clinton D. Maple Hudson, MD, FCCP, FACPDATE OF BIRTH:  06/01/1936   DATE OF STUDY:  01/10/2006                            NOCTURNAL POLYSOMNOGRAM   INDICATION FOR STUDY:  Hypersomnia with sleep apnea.   EPWORTH SLEEPINESS SCORE:  12/24. BMI 27.3, weight 145 pounds.   MEDICATIONS:  Are charted and reviewed. Bed time medications included  Singulair, Zyrtec, Darvocet, Micro-K and Synthroid.   A baseline diagnostic NPSG on November 05, 2005 recorded an AHI of  17.9/hour. CPAP titration is requested.   SLEEP ARCHITECTURE:  Total sleep time 331 minutes with sleep efficiency  82%. Stage I was 8%, stage 2 70%, stages 3 and 4 1%, REM 20% of total  sleep time. Sleep latency 37 minutes, REM latency 79 minutes, awake  after sleep onset 35 minutes, arousal index 21.4.   RESPIRATORY DATA:  CPAP titration protocol. CPAP was titrated to 9 CWP,  AHI 0 per hour. She used her own medium Ultra mirage mask with heated  humidifier.   OXYGEN DATA:  Snoring was eliminated at CPAP of 9 CWP. Oxygen saturation  was held at 96% on room air.   CARDIAC DATA:  Sinus rhythm with heart rate 55-68 beats per minute.   MOVEMENT-PARASOMNIA:  Frequent limb jerks were counted with a total of  225 but essentially none of these were noted to cause sleep disturbance.   IMPRESSIONS-RECOMMENDATIONS:  1. Successful CPAP titration to 9 CWP, AHI 0 per hour. A medium Ultra      mirage mask was used with heated humidifier.  2. Baseline NPSG on November 05, 2005 recorded an AHI of 17.9/hour.      Clinton D. Maple Hudson, MD, [facility], FACP  Diplomate, Biomedical engineer of Sleep Medicine  Electronically Signed    CDY/MEDQ  D:  01/13/2006 11:42:58  T:  01/13/2006 21:44:33  Job:  [id number]

## 2010-08-11 ENCOUNTER — Telehealth: Payer: Self-pay | Admitting: Internal Medicine

## 2010-08-11 MED ORDER — CLARITHROMYCIN 500 MG PO TABS: 500.0000 mg | ORAL_TABLET | Freq: Two times a day (BID) | ORAL | Status: AC

## 2010-08-11 NOTE — Telephone Encounter (Signed)
Called and spoke with pt and she stated that this started out as nasal congestion.  Pt has been sick since Monday.  Cough with yellow/green sputum and has not been feeling good at all.  Pt is requesting abx to be called into the pharmacy.  Allergic to pcn and and sulfa.  Please advise. thanks

## 2010-08-11 NOTE — Telephone Encounter (Signed)
Per CDY Biaxin 500 mg #20 1 BID  Called and spoke with pt and is aware rx was sent to [facility]. Pt verbalized understanding and had no questions

## 2010-09-29 LAB — URINALYSIS, ROUTINE W REFLEX MICROSCOPIC
Bilirubin Urine: NEGATIVE
Ketones, ur: NEGATIVE
Protein, ur: NEGATIVE
Urobilinogen, UA: 1

## 2010-09-29 LAB — BASIC METABOLIC PANEL
CO2: 25
Chloride: 103
GFR calc Af Amer: 56 — ABNORMAL LOW
Sodium: 134 — ABNORMAL LOW

## 2010-09-29 LAB — CBC
HCT: 32.2 — ABNORMAL LOW
Hemoglobin: 12.4
MCV: 92.2
Platelets: 262
RBC: 3.49 — ABNORMAL LOW
RBC: 4.03
RDW: 13.6
WBC: 10.9 — ABNORMAL HIGH

## 2010-09-29 LAB — DIFFERENTIAL
Basophils Relative: 1
Eosinophils Absolute: 0.2
Eosinophils Relative: 4
Monocytes Absolute: 0.5
Monocytes Relative: 7
Neutrophils Relative %: 60

## 2010-09-29 LAB — COMPREHENSIVE METABOLIC PANEL
ALT: 27
Alkaline Phosphatase: 71
Glucose, Bld: 89
Potassium: 4
Sodium: 138
Total Protein: 6.4

## 2010-09-29 LAB — CA 125: CA 125: 9.6

## 2010-10-13 ENCOUNTER — Ambulatory Visit (INDEPENDENT_AMBULATORY_CARE_PROVIDER_SITE_OTHER): Payer: Medicare Other | Admitting: Internal Medicine

## 2010-10-13 ENCOUNTER — Encounter: Payer: Self-pay | Admitting: Internal Medicine

## 2010-10-13 VITALS — BP 140/90 | HR 75 | Ht 61.0 in | Wt 154.8 lb

## 2010-10-13 DIAGNOSIS — G4733 Obstructive sleep apnea (adult) (pediatric): Secondary | ICD-10-CM

## 2010-10-13 DIAGNOSIS — J45909 Unspecified asthma, uncomplicated: Secondary | ICD-10-CM

## 2010-10-13 MED ORDER — MONTELUKAST SODIUM 10 MG PO TABS: 10.0000 mg | ORAL_TABLET | Freq: Every day | ORAL | Status: DC

## 2010-10-13 NOTE — Progress Notes (Signed)
Patient ID: Nancy Cole, female    DOB: [birth year], 76 y.o.   MRN: [id number]  HPI 21 yoF never smoker, followed here for asthma/ fixed obstruction, OSA and last seen January 13, 2010 for surgical clearance before hernia surgery, which went very well. CPAP 9 was dropped off after surgery. It began bothering her last year- couldn't tolerate anything on her face. She didn't want to bother, and the pressure felt wrong. I discussed recent study finding dementia in untreated older women with untreated OSA and she is willing to restart.  Heavy pollen is causing watery eyes and nose.   10/13/10- 76 yoF never smoker, followed here for asthma/ fixed obstruction, OSA She does not recognize any seasonal changes as the fall weather has come in. Nasalcrom nasal spray did help. CPAP auto titration was done and we are searching for the download report. She is using it all night every night and feels comfortably rested during the daytime.  Review of Systems See HPI Constitutional:   No-   weight loss, night sweats, fevers, chills, fatigue, lassitude. HEENT:   No-  headaches, difficulty swallowing, tooth/dental problems, sore throat,       No-  sneezing, itching, ear ache, nasal congestion, post nasal drip,  CV:  No-   chest pain, orthopnea, PND, swelling in lower extremities, anasarca, dizziness, palpitations Resp: No-   shortness of breath with exertion or at rest.              No-   productive cough,  No non-productive cough,  No-  coughing up of blood.              No-   change in color of mucus.  No- wheezing.   Skin: No-   rash or lesions. GI:  No-   heartburn, indigestion, abdominal pain, nausea, vomiting, diarrhea,                 change in bowel habits, loss of appetite GU: No-   dysuria, change in color of urine, no urgency or frequency.  No- flank pain. MS:  No-   joint pain or swelling.  No- decreased range of motion.  No- back pain. Neuro- grossly normal to observation, Or:  Psych:  No-  change in mood or affect. No depression or anxiety.  No memory loss.  Objective:   Physical Exam General- Alert, Oriented, Affect-appropriate, Distress- none acute Skin- rash-none, lesions- none, excoriation- none Lymphadenopathy- none Head- atraumatic            Eyes- Gross vision intact, PERRLA, conjunctivae clear secretions            Ears- Hearing, canals-normal            Nose- Clear, no-Septal dev, mucus, polyps, erosion, perforation             Throat- Mallampati III , mucosa clear , drainage- none, tonsils- atrophic Neck- flexible , trachea midline, no stridor , thyroid nl, carotid no bruit Chest - symmetrical excursion , unlabored           Heart/CV- RRR , no murmur , no gallop  , no rub, nl s1 s2                           - JVD- none , edema- none, stasis changes- none, varices- none           Lung- clear to P&A, wheeze- none, cough- none ,  dullness-none, rub- none           Chest wall-  Abd- tender-no, distended-no, bowel sounds-present, HSM- no Br/ Gen/ Rectal- Not done, not indicated Extrem- cyanosis- none, clubbing, none, atrophy- none, strength- nl Neuro- grossly intact to observation      Assessment & Plan:

## 2010-10-13 NOTE — Patient Instructions (Signed)
Singulair script sent to [facility]  Flu vax

## 2010-10-13 NOTE — Progress Notes (Signed)
  Subjective:    Patient ID: Nancy Cole, female    DOB: 09-May-1934, 76 y.o.   MRN: [id number]  HPI    Review of Systems     Objective:   Physical Exam        Assessment & Plan:

## 2010-10-15 NOTE — Assessment & Plan Note (Signed)
She has tolerated auto titration. Whether she will tolerate fixed pressure remains to be seen

## 2010-10-15 NOTE — Assessment & Plan Note (Signed)
Chronic obstructive asthma with a fixed obstructive component. Symptomatically she is doing quite well but her activity has not been much. She is not needing aggressive bronchodilator therapy.

## 2011-03-26 ENCOUNTER — Telehealth: Payer: Self-pay | Admitting: Internal Medicine

## 2011-03-26 NOTE — Telephone Encounter (Signed)
I called patient-needed to [facility] appt on 04-13-11 with CY as he is not in office this day. Pt's appt made for 05-01-11 at 945 am.

## 2011-04-13 ENCOUNTER — Ambulatory Visit: Payer: Medicare Other | Admitting: Internal Medicine

## 2011-04-13 ENCOUNTER — Other Ambulatory Visit: Payer: Self-pay | Admitting: Internal Medicine

## 2011-04-13 NOTE — Telephone Encounter (Signed)
Patient needs to contact providers office for refills on this medication.

## 2011-04-23 ENCOUNTER — Telehealth: Payer: Self-pay | Admitting: Internal Medicine

## 2011-04-23 MED ORDER — HYDROCODONE-HOMATROPINE 5-1.5 MG/5ML PO SYRP: 5.0000 mL | ORAL_SOLUTION | Freq: Four times a day (QID) | ORAL | Status: DC | PRN

## 2011-04-23 NOTE — Telephone Encounter (Signed)
[facility]  [employer] [place] Hydrocodone-homatropine syrup  Take 1 teaspoonful every 6 hours prn  #120 Allergies  Allergen Reactions  . Penicillins     REACTION: rash  . Sulfamethoxazole     REACTION: unspecified   Dr Maple Hudson is this ok to fill Thank you

## 2011-04-23 NOTE — Telephone Encounter (Signed)
Refilled per CY and notified patient

## 2011-04-25 ENCOUNTER — Encounter (HOSPITAL_COMMUNITY): Payer: Self-pay | Admitting: Emergency Medicine

## 2011-04-25 ENCOUNTER — Emergency Department (HOSPITAL_COMMUNITY): Payer: Medicare Other

## 2011-04-25 ENCOUNTER — Inpatient Hospital Stay (HOSPITAL_COMMUNITY)
Admission: EM | Admit: 2011-04-25 | Discharge: 2011-05-02 | DRG: 690 | Disposition: A | Discharge status: Home Health | Payer: Medicare Other | Attending: Internal Medicine | Admitting: Internal Medicine

## 2011-04-25 DIAGNOSIS — D72829 Elevated white blood cell count, unspecified: Secondary | ICD-10-CM | POA: Diagnosis present

## 2011-04-25 DIAGNOSIS — G4733 Obstructive sleep apnea (adult) (pediatric): Secondary | ICD-10-CM | POA: Diagnosis present

## 2011-04-25 DIAGNOSIS — K219 Gastro-esophageal reflux disease without esophagitis: Secondary | ICD-10-CM | POA: Diagnosis present

## 2011-04-25 DIAGNOSIS — N12 Tubulo-interstitial nephritis, not specified as acute or chronic: Secondary | ICD-10-CM | POA: Diagnosis present

## 2011-04-25 DIAGNOSIS — J449 Chronic obstructive pulmonary disease, unspecified: Secondary | ICD-10-CM | POA: Insufficient documentation

## 2011-04-25 DIAGNOSIS — R7989 Other specified abnormal findings of blood chemistry: Secondary | ICD-10-CM | POA: Diagnosis present

## 2011-04-25 DIAGNOSIS — I10 Essential (primary) hypertension: Secondary | ICD-10-CM | POA: Diagnosis present

## 2011-04-25 DIAGNOSIS — N10 Acute tubulo-interstitial nephritis: Principal | ICD-10-CM | POA: Diagnosis present

## 2011-04-25 DIAGNOSIS — B379 Candidiasis, unspecified: Secondary | ICD-10-CM | POA: Diagnosis not present

## 2011-04-25 DIAGNOSIS — N289 Disorder of kidney and ureter, unspecified: Secondary | ICD-10-CM

## 2011-04-25 DIAGNOSIS — J45909 Unspecified asthma, uncomplicated: Secondary | ICD-10-CM | POA: Diagnosis present

## 2011-04-25 DIAGNOSIS — N390 Urinary tract infection, site not specified: Secondary | ICD-10-CM

## 2011-04-25 DIAGNOSIS — M545 Low back pain, unspecified: Secondary | ICD-10-CM | POA: Diagnosis present

## 2011-04-25 DIAGNOSIS — M949 Disorder of cartilage, unspecified: Secondary | ICD-10-CM | POA: Diagnosis present

## 2011-04-25 DIAGNOSIS — I959 Hypotension, unspecified: Secondary | ICD-10-CM | POA: Diagnosis present

## 2011-04-25 DIAGNOSIS — A498 Other bacterial infections of unspecified site: Secondary | ICD-10-CM | POA: Diagnosis present

## 2011-04-25 DIAGNOSIS — J4489 Other specified chronic obstructive pulmonary disease: Secondary | ICD-10-CM | POA: Insufficient documentation

## 2011-04-25 DIAGNOSIS — F329 Major depressive disorder, single episode, unspecified: Secondary | ICD-10-CM | POA: Diagnosis present

## 2011-04-25 DIAGNOSIS — B370 Candidal stomatitis: Secondary | ICD-10-CM | POA: Diagnosis present

## 2011-04-25 DIAGNOSIS — E860 Dehydration: Secondary | ICD-10-CM | POA: Diagnosis present

## 2011-04-25 DIAGNOSIS — E039 Hypothyroidism, unspecified: Secondary | ICD-10-CM | POA: Diagnosis present

## 2011-04-25 DIAGNOSIS — F3289 Other specified depressive episodes: Secondary | ICD-10-CM | POA: Diagnosis present

## 2011-04-25 DIAGNOSIS — A419 Sepsis, unspecified organism: Secondary | ICD-10-CM | POA: Diagnosis present

## 2011-04-25 DIAGNOSIS — R197 Diarrhea, unspecified: Secondary | ICD-10-CM | POA: Diagnosis present

## 2011-04-25 DIAGNOSIS — J310 Chronic rhinitis: Secondary | ICD-10-CM

## 2011-04-25 DIAGNOSIS — E876 Hypokalemia: Secondary | ICD-10-CM | POA: Diagnosis present

## 2011-04-25 DIAGNOSIS — M899 Disorder of bone, unspecified: Secondary | ICD-10-CM | POA: Diagnosis present

## 2011-04-25 HISTORY — DX: Obstructive sleep apnea (adult) (pediatric): G47.33

## 2011-04-25 HISTORY — DX: Other seasonal allergic rhinitis: J30.2

## 2011-04-25 HISTORY — DX: Dependence on other enabling machines and devices: Z99.89

## 2011-04-25 HISTORY — DX: Diverticulitis of intestine, part unspecified, without perforation or abscess without bleeding: K57.92

## 2011-04-25 LAB — LIPASE, BLOOD: Lipase: 28 U/L (ref 11–59)

## 2011-04-25 LAB — PROCALCITONIN: Procalcitonin: >175 ng/mL

## 2011-04-25 LAB — CBC
HCT: 38.9 % (ref 36.0–46.0)
Hemoglobin: 13.1 g/dL (ref 12.0–15.0)
MCH: 29.7 pg (ref 26.0–34.0)
MCHC: 33.7 g/dL (ref 30.0–36.0)

## 2011-04-25 LAB — DIFFERENTIAL
Basophils Relative: 0 % (ref 0–1)
Eosinophils Absolute: 0 10*3/uL (ref 0.0–0.7)
Lymphs Abs: 0.7 10*3/uL (ref 0.7–4.0)
Monocytes Absolute: 1.1 10*3/uL — ABNORMAL HIGH (ref 0.1–1.0)
Neutro Abs: 34.7 10*3/uL — ABNORMAL HIGH (ref 1.7–7.7)

## 2011-04-25 LAB — URINALYSIS, ROUTINE W REFLEX MICROSCOPIC
Bilirubin Urine: NEGATIVE
Ketones, ur: NEGATIVE mg/dL
Nitrite: NEGATIVE
Urobilinogen, UA: 0.2 mg/dL (ref 0.0–1.0)
pH: 6 (ref 5.0–8.0)

## 2011-04-25 LAB — COMPREHENSIVE METABOLIC PANEL
BUN: 24 mg/dL — ABNORMAL HIGH (ref 6–23)
Calcium: 8.5 mg/dL (ref 8.4–10.5)
GFR calc Af Amer: 25 mL/min — ABNORMAL LOW (ref >90)
Glucose, Bld: 112 mg/dL — ABNORMAL HIGH (ref 70–99)
Total Protein: 5.9 g/dL — ABNORMAL LOW (ref 6.0–8.3)

## 2011-04-25 LAB — GLUCOSE, CAPILLARY

## 2011-04-25 LAB — URINE MICROSCOPIC-ADD ON

## 2011-04-25 LAB — TROPONIN I: Troponin I: <0.3 ng/mL (ref <0.30)

## 2011-04-25 MED ORDER — CIPROFLOXACIN IN D5W 400 MG/200ML IV SOLN
200.0000 mg | Freq: Two times a day (BID) | INTRAVENOUS | Status: AC
  Administered 2011-04-25 – 2011-04-28 (×7): 200 mg via INTRAVENOUS
  Filled 2011-04-25 (×8): qty 200

## 2011-04-25 MED ORDER — VITAMIN D3 25 MCG (1000 UNIT) PO TABS
2000.0000 [IU] | ORAL_TABLET | Freq: Every day | ORAL | Status: DC
  Administered 2011-04-25 – 2011-05-02 (×8): 2000 [IU] via ORAL
  Filled 2011-04-25 (×8): qty 2

## 2011-04-25 MED ORDER — TIOTROPIUM BROMIDE MONOHYDRATE 18 MCG IN CAPS
18.0000 ug | ORAL_CAPSULE | Freq: Every day | RESPIRATORY_TRACT | Status: DC
  Filled 2011-04-25: qty 5

## 2011-04-25 MED ORDER — ENOXAPARIN SODIUM 30 MG/0.3ML ~~LOC~~ SOLN
30.0000 mg | SUBCUTANEOUS | Status: DC
  Administered 2011-04-25 – 2011-04-26 (×2): 30 mg via SUBCUTANEOUS
  Filled 2011-04-25 (×3): qty 0.3

## 2011-04-25 MED ORDER — ALBUTEROL SULFATE HFA 108 (90 BASE) MCG/ACT IN AERS
2.0000 | INHALATION_SPRAY | Freq: Four times a day (QID) | RESPIRATORY_TRACT | Status: DC | PRN
  Administered 2011-04-28: 2 via RESPIRATORY_TRACT
  Filled 2011-04-25: qty 6.7

## 2011-04-25 MED ORDER — LEVOTHYROXINE SODIUM 100 MCG PO TABS
100.0000 ug | ORAL_TABLET | Freq: Every day | ORAL | Status: DC
  Administered 2011-04-26 – 2011-05-02 (×6): 100 ug via ORAL
  Filled 2011-04-25 (×7): qty 1

## 2011-04-25 MED ORDER — IPRATROPIUM BROMIDE 0.02 % IN SOLN
0.5000 mg | Freq: Once | RESPIRATORY_TRACT | Status: AC
  Administered 2011-04-25: 0.5 mg via RESPIRATORY_TRACT
  Filled 2011-04-25: qty 2.5

## 2011-04-25 MED ORDER — SODIUM CHLORIDE 0.9 % IV BOLUS (SEPSIS)
500.0000 mL | Freq: Once | INTRAVENOUS | Status: AC
  Administered 2011-04-25: 500 mL via INTRAVENOUS

## 2011-04-25 MED ORDER — ALBUTEROL SULFATE (5 MG/ML) 0.5% IN NEBU
2.5000 mg | INHALATION_SOLUTION | RESPIRATORY_TRACT | Status: DC | PRN
  Administered 2011-04-26: 2.5 mg via RESPIRATORY_TRACT
  Filled 2011-04-25: qty 0.5

## 2011-04-25 MED ORDER — ONDANSETRON HCL 4 MG/2ML IJ SOLN
4.0000 mg | INTRAMUSCULAR | Status: AC | PRN
  Administered 2011-04-25 – 2011-04-27 (×2): 4 mg via INTRAVENOUS
  Filled 2011-04-25 (×2): qty 2

## 2011-04-25 MED ORDER — SODIUM CHLORIDE 0.9 % IJ SOLN
3.0000 mL | Freq: Two times a day (BID) | INTRAMUSCULAR | Status: DC
  Administered 2011-04-28 – 2011-05-02 (×8): 3 mL via INTRAVENOUS

## 2011-04-25 MED ORDER — LORATADINE 10 MG PO TABS
10.0000 mg | ORAL_TABLET | Freq: Every day | ORAL | Status: DC
  Administered 2011-04-25 – 2011-05-02 (×8): 10 mg via ORAL
  Filled 2011-04-25 (×8): qty 1

## 2011-04-25 MED ORDER — ALBUTEROL SULFATE (5 MG/ML) 0.5% IN NEBU
2.5000 mg | INHALATION_SOLUTION | Freq: Once | RESPIRATORY_TRACT | Status: AC
  Administered 2011-04-25: 2.5 mg via RESPIRATORY_TRACT
  Filled 2011-04-25: qty 0.5

## 2011-04-25 MED ORDER — TIOTROPIUM BROMIDE MONOHYDRATE 18 MCG IN CAPS
18.0000 ug | ORAL_CAPSULE | Freq: Every day | RESPIRATORY_TRACT | Status: DC
  Administered 2011-04-26 – 2011-05-02 (×7): 18 ug via RESPIRATORY_TRACT
  Filled 2011-04-25: qty 5

## 2011-04-25 MED ORDER — MONTELUKAST SODIUM 10 MG PO TABS
10.0000 mg | ORAL_TABLET | Freq: Every day | ORAL | Status: DC
  Administered 2011-04-25 – 2011-05-01 (×7): 10 mg via ORAL
  Filled 2011-04-25 (×8): qty 1

## 2011-04-25 MED ORDER — ALBUTEROL SULFATE (5 MG/ML) 0.5% IN NEBU
5.0000 mg | INHALATION_SOLUTION | Freq: Once | RESPIRATORY_TRACT | Status: AC
  Administered 2011-04-25: 5 mg via RESPIRATORY_TRACT
  Filled 2011-04-25: qty 1

## 2011-04-25 MED ORDER — SODIUM CHLORIDE 0.9 % IV SOLN
INTRAVENOUS | Status: DC
  Administered 2011-04-25: 15:00:00 via INTRAVENOUS

## 2011-04-25 MED ORDER — POTASSIUM CHLORIDE IN NACL 20-0.9 MEQ/L-% IV SOLN
INTRAVENOUS | Status: DC
  Administered 2011-04-25 – 2011-04-27 (×5): via INTRAVENOUS
  Filled 2011-04-25 (×7): qty 1000

## 2011-04-25 MED ORDER — FLUTICASONE PROPIONATE 50 MCG/ACT NA SUSP
2.0000 | Freq: Every day | NASAL | Status: DC
  Administered 2011-04-25 – 2011-05-02 (×8): 2 via NASAL
  Filled 2011-04-25: qty 16

## 2011-04-25 MED ORDER — CROMOLYN SODIUM 5.2 MG/ACT NA AERS
1.0000 | INHALATION_SPRAY | Freq: Three times a day (TID) | NASAL | Status: DC
  Filled 2011-04-25: qty 13

## 2011-04-25 MED ORDER — SODIUM CHLORIDE 0.9 % IV BOLUS (SEPSIS)
250.0000 mL | Freq: Once | INTRAVENOUS | Status: AC
  Administered 2011-04-25: 250 mL via INTRAVENOUS

## 2011-04-25 MED ORDER — SERTRALINE HCL 100 MG PO TABS
100.0000 mg | ORAL_TABLET | Freq: Every day | ORAL | Status: DC
  Administered 2011-04-25 – 2011-05-02 (×8): 100 mg via ORAL
  Filled 2011-04-25 (×5): qty 1
  Filled 2011-04-25: qty 2
  Filled 2011-04-25 (×2): qty 1

## 2011-04-25 MED ORDER — VITAMIN D 50 MCG (2000 UT) PO CAPS: 1.0000 | ORAL_CAPSULE | Freq: Every day | ORAL | Status: DC

## 2011-04-25 MED ORDER — IPRATROPIUM BROMIDE 0.02 % IN SOLN
0.5000 mg | Freq: Four times a day (QID) | RESPIRATORY_TRACT | Status: DC
  Administered 2011-04-25: 0.5 mg via RESPIRATORY_TRACT

## 2011-04-25 MED ORDER — POTASSIUM CHLORIDE CRYS ER 20 MEQ PO TBCR
20.0000 meq | EXTENDED_RELEASE_TABLET | Freq: Every day | ORAL | Status: DC
  Administered 2011-04-25 – 2011-05-02 (×8): 20 meq via ORAL
  Filled 2011-04-25 (×8): qty 1

## 2011-04-25 MED ORDER — PANTOPRAZOLE SODIUM 40 MG PO TBEC
40.0000 mg | DELAYED_RELEASE_TABLET | Freq: Every day | ORAL | Status: DC
  Administered 2011-04-25 – 2011-05-02 (×8): 40 mg via ORAL
  Filled 2011-04-25 (×9): qty 1

## 2011-04-25 MED ORDER — CIPROFLOXACIN IN D5W 400 MG/200ML IV SOLN
200.0000 mg | Freq: Once | INTRAVENOUS | Status: AC
  Administered 2011-04-25: 200 mg via INTRAVENOUS
  Filled 2011-04-25: qty 200

## 2011-04-25 NOTE — ED Notes (Signed)
AVW:[id number]<XB> Expected date:04/25/11<BR> Expected time:<BR> Means of arrival:<BR> Comments:<BR> [facility]- weakness

## 2011-04-25 NOTE — ED Notes (Signed)
Report given to Eric RN

## 2011-04-25 NOTE — ED Notes (Signed)
Per EMS- Pt. Had some nausea/vomiting that started yesterday morning, some diarrhea today. Has been constipated of past two days. History of COPD. Decreased skin turgor, BP 78/40, was given 1500 ml of fluids during route and placed in [place]. O2 Sat dropped down to low 80s, placed on 4L of O2. Pt sister states that she fell 2 days ago and hit head on rock, noticed some slurred speech afterwards.

## 2011-04-25 NOTE — ED Provider Notes (Signed)
History     CSN: [id number]  Arrival date & time 04/25/11  1332   First MD Initiated Contact with Patient 04/25/11 1344      Chief Complaint  Patient presents with  . Nausea  . Emesis  . Diarrhea  . Fall    HPI Pt was seen at 1355.  Per pt and her family, c/o gradual onset and persistence of multiple intermittent episodes of N/V/D since yesterday.  Has been associated with generalized weakness and poor PO intake.  Pt and her family endorse pt has a hx of freq falls, with her last fall 2 days ago when she "hit her head against a rock."  Pt's family states pt's speech has been "slurred" since then.  Pt's family found pt kneeling on the floor by her bed when they came to her house to check up on her.  EMS noted pt's SBP 70's on their arrival to scene, given IVF with increase SBP to 80's.  Pt denies CP/palpitations, no SOB/cough, no abd pain, no black or blood in stools or emesis, no back pain, no syncope, no fevers, no rash, no facial droop, no focal motor weakness, no tingling/numbness in extremities.     Past Medical History  Diagnosis Date  . Asthma   . COPD (chronic obstructive pulmonary disease)     PFT 2007 FEV1/FVC 43%  PFT 01/12/10 FEV1 1.0/51%, FVC 2.0/79%, FEV1/FVC 0.48  . Osteoarthritis   . DDD (degenerative disc disease)   . OSA on CPAP   . Seasonal allergies     Past Surgical History  Procedure Date  . Pleural scarification 1979    recurrent spontaneuous--L--- finally had tac pleurodesis  . Appendectomy   . Cholecystectomy   . Total knee arthroplasty     L  . Partial colectomy 2009    for diverticulitis    Family History  Problem Relation Age of Onset  . COPD Father     heavy smoker  . Heart attack Mother   . COPD Brother     smoker    History  Substance Use Topics  . Smoking status: Never Smoker   . Smokeless tobacco: Not on file  . Alcohol Use: No    Review of Systems ROS: Statement: All systems negative except as marked or noted in the HPI;  Constitutional: Negative for fever and chills., +poor PO intake, generalized weakness. ; ; Eyes: Negative for eye pain, redness and discharge. ; ; ENMT: Negative for ear pain, hoarseness, nasal congestion, sinus pressure and sore throat. ; ; Cardiovascular: Negative for chest pain, palpitations, diaphoresis, dyspnea and peripheral edema. ; ; Respiratory: Negative for cough, wheezing and stridor. ; ; Gastrointestinal: +N/V/D.  Negative for abdominal pain, blood in stool, hematemesis, jaundice and rectal bleeding. . ; ; Genitourinary: Negative for dysuria, flank pain and hematuria. ; ; Musculoskeletal: Negative for back pain and neck pain. Negative for swelling and trauma.; ; Skin: Negative for pruritus, rash, abrasions, blisters, bruising and skin lesion.; ; Neuro: Negative for headache, lightheadedness and neck stiffness. Negative for altered level of consciousness , altered mental status, extremity weakness, paresthesias, involuntary movement, seizure and syncope.      Allergies  Penicillins and Sulfamethoxazole  Home Medications   Current Outpatient Rx  Name Route Sig Dispense Refill  . ALBUTEROL SULFATE HFA 108 (90 BASE) MCG/ACT IN AERS Inhalation Inhale 2 puffs into the lungs every 6 (six) hours as needed. Wheezing/shortness of breath.    . CETIRIZINE HCL 10 MG PO TABS Oral  Take 10 mg by mouth daily.     Marland Kitchen VITAMIN D 2000 UNITS PO CAPS Oral Take 1 capsule by mouth daily.      Marland Kitchen HYDROCODONE-HOMATROPINE 5-1.5 MG/5ML PO SYRP Oral Take 5 mLs by mouth every 6 (six) hours as needed. 120 mL 0  . LEVOTHYROXINE SODIUM 100 MCG PO TABS Oral Take 100 mcg by mouth daily.      Marland Kitchen MONTELUKAST SODIUM 10 MG PO TABS Oral Take 1 tablet (10 mg total) by mouth at bedtime. 90 tablet 3  . OMEPRAZOLE MAGNESIUM 20 MG PO TBEC Oral Take 20 mg by mouth daily.      Marland Kitchen OMEPRAZOLE 20 MG PO CPDR Oral Take 20 mg by mouth daily.    Marland Kitchen POTASSIUM CHLORIDE CRYS ER 10 MEQ PO TBCR  2 tabs twice daily    . SERTRALINE HCL 100 MG PO TABS  Oral Take 100 mg by mouth daily.    Marland Kitchen TIOTROPIUM BROMIDE MONOHYDRATE 18 MCG IN CAPS Inhalation Place 18 mcg into inhaler and inhale daily.      Marland Kitchen CROMOLYN SODIUM 5.2 MG/ACT NA AERS Nasal 1 spray by Nasal route 3 (three) times daily. 26 mL 2    BP 99/58  Pulse 112  Temp(Src) 98.9 F (37.2 C) (Oral)  Resp 16  SpO2 94%  Physical Exam 1400: Physical examination:  Nursing notes reviewed; Vital signs and O2 SAT reviewed;  Constitutional: Well developed, Well nourished, In no acute distress; Head:  Normocephalic, atraumatic; Eyes: EOMI, PERRL, No scleral icterus; ENMT: Mouth and pharynx normal, Mucous membranes dry; Neck: Supple, Full range of motion, No lymphadenopathy; Cardiovascular: Tachycardic rate and rhythm, No gallop; Respiratory: Breath sounds coarse & equal bilaterally with faint scattered wheeze, speaking full sentences, Normal respiratory effort/excursion; Chest: Nontender, Movement normal; Abdomen: Soft, Nontender, Nondistended, Normal bowel sounds; Extremities: Pulses normal, No tenderness, No edema, No calf edema or asymmetry.; Neuro: AA&Ox3, Major CN grossly intact. No facial droop, speech clear.  Moves all ext on stretcher without apparent gross focal motor deficits.; Skin: Color pale, Warm, Dry, no rash.    ED Course  Procedures   MDM  MDM Reviewed: previous chart, vitals and nursing note Reviewed previous: labs Interpretation: ECG, labs and x-ray Total time providing critical care: 30-74 minutes. This excludes time spent performing separately reportable procedures and services. Consults: admitting MD   CRITICAL CARE Performed by: Laray Anger Total critical care time: 35 Critical care time was exclusive of separately billable procedures and treating other patients. Critical care was necessary to treat or prevent imminent or life-threatening deterioration. Critical care was time spent personally by me on the following activities: development of treatment plan with  patient and/or surrogate as well as nursing, discussions with consultants, evaluation of patient's response to treatment, examination of patient, obtaining history from patient or surrogate, ordering and performing treatments and interventions, ordering and review of laboratory studies, ordering and review of radiographic studies, pulse oximetry and re-evaluation of patient's condition.     Date: 04/25/2011  Rate: 114  Rhythm: sinus tachycardia  QRS Axis: normal  Intervals: normal  ST/T Wave abnormalities: nonspecific T wave changes  Conduction Disutrbances:none  Narrative Interpretation:   Old EKG Reviewed: none available.  Results for orders placed during the hospital encounter of 04/25/11  CBC      Component Value Range   WBC 36.5 (*) 4.0 - 10.5 (K/uL)   RBC 4.41  3.87 - 5.11 (MIL/uL)   Hemoglobin 13.1  12.0 - 15.0 (g/dL)   HCT 38.9  36.0 - 46.0 (%)   MCV 88.2  78.0 - 100.0 (fL)   MCH 29.7  26.0 - 34.0 (pg)   MCHC 33.7  30.0 - 36.0 (g/dL)   RDW 16.1  09.6 - 04.5 (%)   Platelets 157  150 - 400 (K/uL)  DIFFERENTIAL      Component Value Range   Neutrophils Relative 95 (*) 43 - 77 (%)   Lymphocytes Relative 2 (*) 12 - 46 (%)   Monocytes Relative 3  3 - 12 (%)   Eosinophils Relative 0  0 - 5 (%)   Basophils Relative 0  0 - 1 (%)   Neutro Abs 34.7 (*) 1.7 - 7.7 (K/uL)   Lymphs Abs 0.7  0.7 - 4.0 (K/uL)   Monocytes Absolute 1.1 (*) 0.1 - 1.0 (K/uL)   Eosinophils Absolute 0.0  0.0 - 0.7 (K/uL)   Basophils Absolute 0.0  0.0 - 0.1 (K/uL)   WBC Morphology INCREASED BANDS (>20% BANDS)    COMPREHENSIVE METABOLIC PANEL      Component Value Range   Sodium 138  135 - 145 (mEq/L)   Potassium 3.2 (*) 3.5 - 5.1 (mEq/L)   Chloride 105  96 - 112 (mEq/L)   CO2 18 (*) 19 - 32 (mEq/L)   Glucose, Bld 112 (*) 70 - 99 (mg/dL)   BUN 24 (*) 6 - 23 (mg/dL)   Creatinine, Ser 4.09 (*) 0.50 - 1.10 (mg/dL)   Calcium 8.5  8.4 - 81.1 (mg/dL)   Total Protein 5.9 (*) 6.0 - 8.3 (g/dL)   Albumin 3.2 (*)  3.5 - 5.2 (g/dL)   AST 62 (*) 0 - 37 (U/L)   ALT 33  0 - 35 (U/L)   Alkaline Phosphatase 67  39 - 117 (U/L)   Total Bilirubin 0.6  0.3 - 1.2 (mg/dL)   GFR calc non Af Amer 21 (*) >90 (mL/min)   GFR calc Af Amer 25 (*) >90 (mL/min)  LIPASE, BLOOD      Component Value Range   Lipase 28  11 - 59 (U/L)  LACTIC ACID, PLASMA      Component Value Range   Lactic Acid, Venous 3.1 (*) 0.5 - 2.2 (mmol/L)  TROPONIN I      Component Value Range   Troponin I <0.30  <0.30 (ng/mL)  URINALYSIS, ROUTINE W REFLEX MICROSCOPIC      Component Value Range   Color, Urine AMBER (*) YELLOW    APPearance TURBID (*) CLEAR    Specific Gravity, Urine 1.013  1.005 - 1.030    pH 6.0  5.0 - 8.0    Glucose, UA NEGATIVE  NEGATIVE (mg/dL)   Hgb urine dipstick LARGE (*) NEGATIVE    Bilirubin Urine NEGATIVE  NEGATIVE    Ketones, ur NEGATIVE  NEGATIVE (mg/dL)   Protein, ur 914 (*) NEGATIVE (mg/dL)   Urobilinogen, UA 0.2  0.0 - 1.0 (mg/dL)   Nitrite NEGATIVE  NEGATIVE    Leukocytes, UA LARGE (*) NEGATIVE   URINE MICROSCOPIC-ADD ON      Component Value Range   Squamous Epithelial / LPF FEW (*) RARE    WBC, UA TOO NUMEROUS TO COUNT  <3 (WBC/hpf)   RBC / HPF 7-10  <3 (RBC/hpf)   Bacteria, UA MANY (*) RARE    Ct Head Wo Contrast 04/25/2011  *RADIOLOGY REPORT*  Clinical Data: Nausea and emesis.  Fall.  CT HEAD WITHOUT CONTRAST  Technique:  Contiguous axial images were obtained from the base of the skull through the  vertex without contrast.  Comparison: None.  Findings: Lacunar infarcts of the cerebellum bilaterally are age indeterminate.  A larger area of hypoattenuation in the inferior left cerebellum also likely represents ischemia, age indeterminate. There is slight prominence of the extra-axial CSF within the left posterior fossa, likely representing an arachnoid cyst. A focal calcification within the left temporal lobe measures 6 mm on image 14.  Moderate periventricular and diffuse confluent subcortical white matter  hypoattenuation is present bilaterally.  No acute supratentorial cortical infarct, hemorrhage, or mass lesion is present.  The ventricles are proportionate to the degree of atrophy.  No significant extra-axial fluid collection is present.  The paranasal sinuses and mastoid air cells are clear.  The osseous skull is intact.  IMPRESSION:  1.  Age indeterminate lacunar infarcts of the cerebellum bilaterally. 2.  Age indeterminate left inferior cerebellar infarct, measuring up to 2.5 cm. 3.  Atrophy and extensive white matter disease within the supratentorial brain.  This likely reflects the sequelae of chronic microvascular ischemia. 4.  Focal calcification within the left temporal lobe.  This may be related to prior infection or contusion.  Neoplasm is considered less likely.  MRI without and with contrast would be useful for further evaluation as clinically indicated.  Original Report Authenticated By: Jamesetta Orleans. MATTERN, M.D.   Dg Chest Port 1 View 04/25/2011  *RADIOLOGY REPORT*  Clinical Data: Shortness of breath and cough.  PORTABLE CHEST - 1 VIEW  Comparison: None.  Findings: Streaky bibasilar airspace opacities are most consistent with subsegmental atelectasis.  Lungs otherwise appear clear. Heart size is normal.  No pneumothorax or pleural fluid.  IMPRESSION: Streaky bibasilar airspace opacity most consistent with subsegmental atelectasis.  Original Report Authenticated By: Bernadene Bell. Maricela Curet, M.D.    Results for CARINNE, BRANDENBURGER (MRN [id number]) as of 04/25/2011 16:37  Ref. Range 01/12/2010 12:30 04/25/2011 14:55  BUN Latest Range: 6-23 mg/dL 12 24 (H)  Creat Latest Range: 0.50-1.10 mg/dL 8.11 9.14 (H)     7:82 PM:   SBP increased to 100 after IVF bolus.  +UTI, UC pending.  No pneumonia on CXR.  CT-H without acute infarct or bleed.  Feels "better" after neb, Sats 95% on O2 N/C.  Pt is mentating per her baseline, talking with her family at bedside, resps without distress.  Abd continues benign.  No  N/V or stooling while in the ED.  Dx testing d/w pt and family.  Questions answered.  Verb understanding, agreeable to admit.  T/C to Dr. Timothy Lasso, case discussed, including:  HPI, pertinent PM/SHx, VS/PE, dx testing, ED course and treatment:  Agreeable to admit, requests to start IV cipro 200mg  for UTI due to renal insuff, he will come to ED for admit.            Laray Anger, DO 04/27/11 2225

## 2011-04-25 NOTE — ED Notes (Signed)
Attempted to call report. Floor RN unable to accept report.  

## 2011-04-25 NOTE — ED Notes (Signed)
RN called respiratory for neb tx.

## 2011-04-25 NOTE — H&P (Addendum)
Nancy Cole is an 77 y.o. female.   PCP:   Gwen Pounds, MD, MD   Chief Complaint:  Fall, Hypotension  HPI: 41 F with Asthma/COPD, HTN,  hypothyroidism,  OSA on BPAP/CPAP,  h/o diverticulitis S/P sig colectomy and L Mucinous Cystadenoma removal   (02/18/07),  Chronic Diarrhea per Dr Dulce Sellar,  Ventral Hernia,   AVMs,   osteopenia,  bilateral cataracts,   GERD,  allergic rhinitis,  low back pain/OA,  & Ventral Hernia who presents to ED after being found down.  She would not come to phone or door for family so they came into her house and found her down.  EMS called and came out.  She was Hypotensive 70's/30's,  Dehydrated.  She claimed N/V/D for 2-3 days prior.  She was given IVFs. B/cshe hit her head she had a CCT with old CVA's - nothing acute.  Labs showed a significant Leukocytosis =36.5K and Azotemia with Bun/Cr 24/2.1 and lactate 3.  C dif ordered.  She has a UTI.  CXR ATX.  Some low sats - given a Neb.  Tachy 110-120 with mild wheeze noted by ED doc.  I was called for inpt admit   Past Medical History:  Past Medical History  Diagnosis Date  . COPD (chronic obstructive pulmonary disease)     PFT 2007 FEV1/FVC 43%  PFT 01/12/10 FEV1 1.0/51%, FVC 2.0/79%, FEV1/FVC 0.48  . Osteoarthritis   . DDD (degenerative disc disease)   . OSA on CPAP   . Seasonal allergies   . Asthma   . Diverticulitis    Asthma/COPD--Formerly steroid dependent 1979- 2006 -  Dr Maple Hudson,  HTN,  hypothyroidism,  OSA on BPAP/CPAP,  h/o diverticulitis S/P sig colectomy and L Mucinous Cystadenoma removal   (02/18/07),  Chronic Diarrhea per Dr Dulce Sellar,  Ventral Hernia S/P Repair,   AVMs,   left fracture arm,   osteopenia,  bilateral cataracts,   had shingles,  liver/renal cyst,   GERD,  allergic rhinitis,  low back pain/OA,  Remote history of GI bleed in the 1980s. ,  Spontaneous pneumothorax in 1979 S/p talc Pleuridesis.        Past Surgical History  Procedure Date  . Pleural scarification 1979    recurrent  spontaneuous--L--- finally had tac pleurodesis  . Appendectomy   . Cholecystectomy   . Total knee arthroplasty     L  . Partial colectomy 2009    for diverticulitis  . Cataract extraction, bilateral     left TKR ,  APPY,  S/P bilateral cataracts,  Lap CHOLE,  Ventral hernia repair 01/20/10 - Dr Derrell Lolling  left eye capule (laser surgery) 3 PTX diverticular surgery   Allergies:   Allergies  Allergen Reactions  . Penicillins     REACTION: rash  . Sulfamethoxazole     REACTION: unspecified     Medications: Prior to Admission medications   Medication Sig Start Date End Date Taking? Authorizing Provider  albuterol (PROAIR HFA) 108 (90 BASE) MCG/ACT inhaler Inhale 2 puffs into the lungs every 6 (six) hours as needed. Wheezing/shortness of breath.   Yes Historical Provider, MD  cetirizine (ZYRTEC) 10 MG tablet Take 10 mg by mouth daily.    Yes Historical Provider, MD  Cholecalciferol (VITAMIN D) 2000 UNITS CAPS Take 1 capsule by mouth daily.     Yes Historical Provider, MD  HYDROcodone-homatropine (HYCODAN) 5-1.5 MG/5ML syrup Take 5 mLs by mouth every 6 (six) hours as needed. 04/23/11 04/22/12 Yes Waymon Budge, MD  levothyroxine (SYNTHROID, LEVOTHROID) 100 MCG tablet Take 100 mcg by mouth daily.     Yes Historical Provider, MD  montelukast (SINGULAIR) 10 MG tablet Take 1 tablet (10 mg total) by mouth at bedtime. 10/13/10 10/13/11 Yes Clinton D Young, MD  omeprazole (PRILOSEC OTC) 20 MG tablet Take 20 mg by mouth daily.     Yes Historical Provider, MD  omeprazole (PRILOSEC) 20 MG capsule Take 20 mg by mouth daily.   Yes Historical Provider, MD  potassium chloride (K-DUR,KLOR-CON) 10 MEQ tablet 2 tabs twice daily 09/14/10  Yes Historical Provider, MD  sertraline (ZOLOFT) 100 MG tablet Take 100 mg by mouth daily.   Yes Historical Provider, MD  tiotropium (SPIRIVA) 18 MCG inhalation capsule Place 18 mcg into inhaler and inhale daily.     Yes Historical Provider, MD  cromolyn (NASALCROM) 5.2  MG/ACT nasal spray 1 spray by Nasal route 3 (three) times daily. 04/13/10 04/13/11  Waymon Budge, MD     Complete Medication List: 1)  Klor-con M10 10 Meq Cr-tabs (Potassium chloride crys cr) .... 2 tablets twice a day 2)  Prilosec 20 Mg Cpdr (Omeprazole) .Marland Kitchen.. 1 po qd 3)  Proair Hfa 108 (90 Base) Mcg/act Aers (Albuterol sulfate) .... 2 puffs qid 4)  Singulair 10 Mg Tabs (Montelukast sodium) .Marland Kitchen.. 1 tab at qhs 5)  Synthroid 100 Mcg Tabs (Levothyroxine sodium) .Marland Kitchen.. 1 tab daily 6)  Zyrtec Allergy 10 Mg Caps (Cetirizine hcl) .Marland Kitchen.. 1 qhs 7)  Zoloft 100 Mg Tabs (Sertraline hcl) .Marland Kitchen.. 1 po qd 8)  Hydrocodone-acetaminophen 5-325 Mg Tabs (Hydrocodone-acetaminophen) .... One po bid prn    Medications Prior to Admission  Medication Dose Route Frequency Provider Last Rate Last Dose  . 0.9 %  sodium chloride infusion   Intravenous Continuous Laray Anger, DO 100 mL/hr at 04/25/11 1430    . albuterol (PROVENTIL) (5 MG/ML) 0.5% nebulizer solution 5 mg  5 mg Nebulization Once Laray Anger, DO   5 mg at 04/25/11 1440  . ciprofloxacin (CIPRO) IVPB 200 mg  200 mg Intravenous Once Laray Anger, DO   200 mg at 04/25/11 1646  . ipratropium (ATROVENT) nebulizer solution 0.5 mg  0.5 mg Nebulization Once Laray Anger, DO   0.5 mg at 04/25/11 1440  . ondansetron (ZOFRAN) injection 4 mg  4 mg Intravenous Q1H PRN Laray Anger, DO      . sodium chloride 0.9 % bolus 250 mL  250 mL Intravenous Once Laray Anger, DO   250 mL at 04/25/11 1645  . sodium chloride 0.9 % bolus 500 mL  500 mL Intravenous Once Laray Anger, DO   500 mL at 04/25/11 1433   Medications Prior to Admission  Medication Sig Dispense Refill  . albuterol (PROAIR HFA) 108 (90 BASE) MCG/ACT inhaler Inhale 2 puffs into the lungs every 6 (six) hours as needed. Wheezing/shortness of breath.      . cetirizine (ZYRTEC) 10 MG tablet Take 10 mg by mouth daily.       . Cholecalciferol (VITAMIN D) 2000 UNITS CAPS Take 1  capsule by mouth daily.        Marland Kitchen HYDROcodone-homatropine (HYCODAN) 5-1.5 MG/5ML syrup Take 5 mLs by mouth every 6 (six) hours as needed.  120 mL  0  . levothyroxine (SYNTHROID, LEVOTHROID) 100 MCG tablet Take 100 mcg by mouth daily.        . montelukast (SINGULAIR) 10 MG tablet Take 1 tablet (10 mg total) by mouth at bedtime.  90 tablet  3  . omeprazole (PRILOSEC OTC) 20 MG tablet Take 20 mg by mouth daily.        Marland Kitchen omeprazole (PRILOSEC) 20 MG capsule Take 20 mg by mouth daily.      . potassium chloride (K-DUR,KLOR-CON) 10 MEQ tablet 2 tabs twice daily      . tiotropium (SPIRIVA) 18 MCG inhalation capsule Place 18 mcg into inhaler and inhale daily.        . cromolyn (NASALCROM) 5.2 MG/ACT nasal spray 1 spray by Nasal route 3 (three) times daily.  26 mL  2     Social History:  reports that she has never smoked. She has never used smokeless tobacco. She reports that she does not drink alcohol or use illicit drugs.  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 COPD Father     heavy smoker  . Heart attack Mother   . COPD Brother     smoker    father: deceased COPD mother: deceased ischemic heart disease siblings: 1 brother: CAD, COPD. (2 brothers total) 1st cousin suicide   Review of Systems:  Review of Systems -      See HPI and Complains of fatigue decreasing vision, weakness, FTT, Falls, cough, dyspnea, urinary frequency, back pain, stiffness and arthritis. . O/W all reviewed and (-)    Physical Exam:  Blood pressure 99/58, pulse 114, temperature 98.9 F (37.2 C), temperature source Oral, resp. rate 23, SpO2 96.00%. Filed Vitals:   04/25/11 1440  04/25/11 1500 04/25/11 1600 04/25/11 1700  BP:   99/58   Pulse:  118 111 114  Temp:   98.9 F (37.2 C)   TempSrc:   Oral   Resp:  25 23 23   SpO2: 95% 95% 94% 96%   General appearance: sick and poor looking.  Mild tachypneic Head: Normocephalic, without obvious abnormality, atraumatic Eyes: conjunctivae/corneas clear. PERRL, EOM's intact.  Nose: Nares normal. Septum midline. Mucosa normal. No drainage or sinus tenderness. OP - Dry. Neck: no adenopathy, no carotid bruit, no JVD and thyroid not enlarged, symmetric, no tenderness/mass/nodules Resp: Tachypnea, min rhonchi and wheeze. Cardio: reg Tachy, Sinus tachy on Monitor. GI: soft, non-tender; bowel sounds normal; no masses,  no organomegaly R Flank pain and CVA tenderness. Extremities: extremities normal, atraumatic, no cyanosis or edema Pulses: 2+ and symmetric Lymph nodes:  no cervical lymphadenopathy Neurologic: Alert and oriented X 3, normal strength and tone. Normal symmetric reflexes.     Labs on Admission:   [facility] 04/25/11 1455  NA 138  K 3.2*  CL 105  CO2 18*  GLUCOSE 112*  BUN 24*  CREATININE 2.15*  CALCIUM 8.5  MG --  PHOS --    Basename 04/25/11 1455  AST 62*  ALT 33  ALKPHOS 67  BILITOT 0.6  PROT 5.9*  ALBUMIN 3.2*    Basename 04/25/11 1455  LIPASE 28  AMYLASE --    Basename 04/25/11 1455  WBC 36.5*  NEUTROABS 34.7*  HGB 13.1  HCT 38.9  MCV 88.2  PLT 157    Basename 04/25/11 1455  CKTOTAL --  CKMB --  CKMBINDEX --  TROPONINI <0.30   No results found for this basename: INR,  PROTIME     LAB RESULT POCT:  Results for orders placed during the hospital encounter of 04/25/11  CBC      Component Value Range   WBC 36.5 (*) 4.0 - 10.5 (K/uL)   RBC 4.41  3.87 - 5.11 (MIL/uL)   Hemoglobin 13.1  12.0 - 15.0 (g/dL)   HCT 11.9  14.7 - 82.9 (%)   MCV 88.2  78.0 - 100.0 (fL)   MCH 29.7  26.0 - 34.0 (pg)   MCHC 33.7  30.0 - 36.0 (g/dL)   RDW 56.2  13.0 - 86.5 (%)   Platelets 157   150 - 400 (K/uL)  DIFFERENTIAL      Component Value Range   Neutrophils Relative 95 (*) 43 - 77 (%)   Lymphocytes Relative 2 (*) 12 - 46 (%)   Monocytes Relative 3  3 - 12 (%)   Eosinophils Relative 0  0 - 5 (%)   Basophils Relative 0  0 - 1 (%)   Neutro Abs 34.7 (*) 1.7 - 7.7 (K/uL)   Lymphs Abs 0.7  0.7 - 4.0 (K/uL)   Monocytes Absolute 1.1 (*) 0.1 - 1.0 (K/uL)   Eosinophils Absolute 0.0  0.0 - 0.7 (K/uL)   Basophils Absolute 0.0  0.0 - 0.1 (K/uL)   WBC Morphology INCREASED BANDS (>20% BANDS)    COMPREHENSIVE METABOLIC PANEL      Component Value Range   Sodium 138  135 - 145 (mEq/L)   Potassium 3.2 (*) 3.5 - 5.1 (mEq/L)   Chloride 105  96 - 112 (mEq/L)   CO2 18 (*) 19 - 32 (mEq/L)   Glucose, Bld 112 (*) 70 - 99 (mg/dL)   BUN 24 (*) 6 - 23 (mg/dL)   Creatinine, Ser 7.84 (*) 0.50 - 1.10 (mg/dL)   Calcium 8.5  8.4 - 69.6 (mg/dL)   Total Protein 5.9 (*) 6.0 - 8.3 (g/dL)   Albumin 3.2 (*) 3.5 - 5.2 (g/dL)   AST 62 (*) 0 - 37 (U/L)   ALT 33  0 - 35 (U/L)   Alkaline Phosphatase 67  39 - 117 (U/L)   Total Bilirubin 0.6  0.3 - 1.2 (mg/dL)   GFR calc non Af Amer 21 (*) >90 (mL/min)   GFR calc Af Amer 25 (*) >90 (mL/min)  LIPASE, BLOOD      Component Value Range   Lipase 28  11 - 59 (U/L)  LACTIC ACID, PLASMA      Component Value Range   Lactic Acid, Venous 3.1 (*) 0.5 - 2.2 (mmol/L)  PROCALCITONIN      Component Value Range   Procalcitonin >175.00    TROPONIN I      Component Value Range   Troponin I <0.30  <0.30 (ng/mL)  URINALYSIS, ROUTINE W REFLEX MICROSCOPIC      Component Value Range   Color, Urine AMBER (*) YELLOW    APPearance TURBID (*) CLEAR    Specific Gravity, Urine 1.013  1.005 - 1.030    pH 6.0  5.0 - 8.0    Glucose, UA NEGATIVE  NEGATIVE (mg/dL)   Hgb urine dipstick LARGE (*) NEGATIVE    Bilirubin Urine NEGATIVE  NEGATIVE    Ketones, ur NEGATIVE  NEGATIVE (mg/dL)   Protein, ur 295 (*) NEGATIVE (mg/dL)   Urobilinogen, UA 0.2  0.0 - 1.0 (mg/dL)   Nitrite  NEGATIVE  NEGATIVE    Leukocytes, UA LARGE (*) NEGATIVE  URINE MICROSCOPIC-ADD ON      Component Value Range   Squamous Epithelial / LPF FEW (*) RARE    WBC, UA TOO NUMEROUS TO COUNT  <3 (WBC/hpf)   RBC / HPF 7-10  <3 (RBC/hpf)   Bacteria, UA MANY (*) RARE       Radiological Exams on Admission: Ct Head Wo Contrast  04/25/2011  *RADIOLOGY REPORT*  Clinical Data: Nausea and emesis.  Fall.  CT HEAD WITHOUT CONTRAST  Technique:  Contiguous axial images were obtained from the base of the skull through the vertex without contrast.  Comparison: None.  Findings: Lacunar infarcts of the cerebellum bilaterally are age indeterminate.  A larger area of hypoattenuation in the inferior left cerebellum also likely represents ischemia, age indeterminate. There is slight prominence of the extra-axial CSF within the left posterior fossa, likely representing an arachnoid cyst. A focal calcification within the left temporal lobe measures 6 mm on image 14.  Moderate periventricular and diffuse confluent subcortical white matter hypoattenuation is present bilaterally.  No acute supratentorial cortical infarct, hemorrhage, or mass lesion is present.  The ventricles are proportionate to the degree of atrophy.  No significant extra-axial fluid collection is present.  The paranasal sinuses and mastoid air cells are clear.  The osseous skull is intact.  IMPRESSION:  1.  Age indeterminate lacunar infarcts of the cerebellum bilaterally. 2.  Age indeterminate left inferior cerebellar infarct, measuring up to 2.5 cm. 3.  Atrophy and extensive white matter disease within the supratentorial brain.  This likely reflects the sequelae of chronic microvascular ischemia. 4.  Focal calcification within the left temporal lobe.  This may be related to prior infection or contusion.  Neoplasm is considered less likely.  MRI without and with contrast would be useful for further evaluation as clinically indicated.  Original Report Authenticated  By: Jamesetta Orleans. MATTERN, M.D.   Dg Chest Port 1 View  04/25/2011  *RADIOLOGY REPORT*  Clinical Data: Shortness of breath and cough.  PORTABLE CHEST - 1 VIEW  Comparison: None.  Findings: Streaky bibasilar airspace opacities are most consistent with subsegmental atelectasis.  Lungs otherwise appear clear. Heart size is normal.  No pneumothorax or pleural fluid.  IMPRESSION: Streaky bibasilar airspace opacity most consistent with subsegmental atelectasis.  Original Report Authenticated By: Bernadene Bell. Maricela Curet, M.D.      Orders placed during the hospital encounter of 04/25/11  . ED EKG  . ED EKG     Assessment/Plan Principal Problem:  *Urosepsis Active Problems:  OBSTRUCTIVE SLEEP APNEA  ASTHMA  COPD  Dehydration  Hypotension  Pyelonephritis  Azotemia  Leukocytosis  Problem # 1:  Pyleonephritis/Urosepsis/Leukocytosis/Azotemia/DeHydration/Hypokalemia/Falls S/p IV Cipro and IVF.  Continue IV ABx Admit to tele. R/out Rhabdo. Check CK/Ti Replace K Follow labs in am. Check Kidney bladder US. Cultures orded. Home meds where appropriate.   Problem # 2:  ASTHMA (ICD-493.90)  asthma/COPD--Formerly steroid dependent Dr Maple Hudson [phone]) Alpha 1 AntiTrypsin PFTs 01/12/10 just prior to surgery - FEV1/FVC 0.48  Her updated medication list for this problem includes:    Proair Hfa 108 (90 Base) Mcg/act Aers (Albuterol sulfate) .Marland Kitchen... 2 puffs qid    Singulair 10 Mg Tabs (Montelukast sodium) .Marland Kitchen... 1 tab at qhs   Problem # 3:  HYPERTENSION (ICD-401.9) Low currently -  Off all meds.  Problem # 4:  HYPOTHYROIDISM (ICD-244.9) on meds and doing well =    Synthroid 100 Mcg Tabs (Levothyroxine sodium) .Marland Kitchen... 1 tab daily   Problem # 5:  OBSTRUCTIVE SLEEP APNEA (  ICD-327.23) OSA supposed to be on BiPAP/CPAP She has not recently been using. O2 for now. AHI 17.9  Problem # 6:  DIARRHEA (ICD-787.91) Chronic Diarrhea per Dr Dulce Sellar h/o diverticulitis S/P sig colectomy and L Mucinous  Cystadenoma removal   (02/18/07), Had CT scan (-).  Next Colonoscopy is 2 years at 62.   Diarrhea started post Doxy for infection.  She is s/p Flagyl.   R/out C dif.  She reports some intermittent constipation as well??  Problem # 7:  DEPRESSION, MILD (ICD-311) continue   Zoloft 100 Mg Tabs (Sertraline hcl) .Marland Kitchen... 1 po qd   Problem # 8:  BACK PAIN, LUMBAR (ICD-724.2) ow back pain/OA,    Dr Alvester Morin has done Shots and nerve blocks. She has gone back to Dr Cleophas Dunker and no further treatments planned currently. SWe tried Ultram 50 BID prn - she reported some SEs and stopped. Currently prn Vicodin Back brace discussed. Also S/p L TKR 4/4/6  Problem # 9:  OSTEOPENIA (ICD-733.90)  H/O left fracture arm/osteopenia Dexa  10/06 and Dr Nicholas Lose had been following. r Fem neck (-) 1.6   Problem # 10:  GERD (ICD-530.81) GERD - stable on omeprazole. PPI  Problem # 11:  ALLERGIC RHINITIS (ICD-477.9)  allergic rhinitis.  Seasonal and worse in summer.    Zyrtec Allergy 10 Mg Caps (Cetirizine hcl) .Marland Kitchen... 1 qhs  Problem 12 - DVT proph.  She will probably be inpt until Monday and if physical function does not improve she may need short term rehab.  Problem #13 - CVAs on CCT - will work on RF reduction when she is better.  May need MRI - discussed    Briley Sulton M 04/25/2011, 6:21 PM

## 2011-04-26 ENCOUNTER — Inpatient Hospital Stay (HOSPITAL_COMMUNITY): Payer: Medicare Other

## 2011-04-26 LAB — CLOSTRIDIUM DIFFICILE BY PCR: Toxigenic C. Difficile by PCR: NEGATIVE

## 2011-04-26 LAB — CBC
HCT: 35 % — ABNORMAL LOW (ref 36.0–46.0)
Hemoglobin: 11.7 g/dL — ABNORMAL LOW (ref 12.0–15.0)
RBC: 3.94 MIL/uL (ref 3.87–5.11)
WBC: 32.8 10*3/uL — ABNORMAL HIGH (ref 4.0–10.5)

## 2011-04-26 LAB — CORTISOL-AM, BLOOD: Cortisol - AM: 28.7 ug/dL — ABNORMAL HIGH (ref 4.3–22.4)

## 2011-04-26 LAB — COMPREHENSIVE METABOLIC PANEL
BUN: 35 mg/dL — ABNORMAL HIGH (ref 6–23)
Calcium: 7.9 mg/dL — ABNORMAL LOW (ref 8.4–10.5)
GFR calc Af Amer: 27 mL/min — ABNORMAL LOW (ref >90)
Glucose, Bld: 95 mg/dL (ref 70–99)
Sodium: 138 mEq/L (ref 135–145)
Total Protein: 5.8 g/dL — ABNORMAL LOW (ref 6.0–8.3)

## 2011-04-26 MED ORDER — VANCOMYCIN HCL IN DEXTROSE 1-5 GM/200ML-% IV SOLN
1000.0000 mg | Freq: Once | INTRAVENOUS | Status: AC
  Administered 2011-04-26: 1000 mg via INTRAVENOUS
  Filled 2011-04-26: qty 200

## 2011-04-26 MED ORDER — IPRATROPIUM BROMIDE 0.02 % IN SOLN: 0.5000 mg | RESPIRATORY_TRACT | Status: DC | PRN

## 2011-04-26 MED ORDER — ACETAMINOPHEN 325 MG PO TABS
650.0000 mg | ORAL_TABLET | Freq: Four times a day (QID) | ORAL | Status: DC | PRN
  Administered 2011-04-26 – 2011-05-01 (×7): 650 mg via ORAL
  Filled 2011-04-26 (×8): qty 2

## 2011-04-26 MED ORDER — BIOTENE DRY MOUTH MT LIQD
15.0000 mL | Freq: Two times a day (BID) | OROMUCOSAL | Status: DC
  Administered 2011-04-26 – 2011-05-02 (×13): 15 mL via OROMUCOSAL

## 2011-04-26 NOTE — Evaluation (Addendum)
Physical Therapy Evaluation Patient Details Name: Nancy Cole MRN: [id number] DOB: 03-29-1934 Today's Date: 04/26/2011  Problem List:  Patient Active Problem List  Diagnoses  . THRUSH  . ADENOCARCINOMA, COLON  . OBSTRUCTIVE SLEEP APNEA  . ASTHMA  . COPD  . OSTEOARTHRITIS  . DYSPNEA  . DIARRHEA  . Rhinitis  . Dehydration  . Hypotension  . Pyelonephritis  . Urosepsis  . Azotemia  . Leukocytosis    Past Medical History:  Past Medical History  Diagnosis Date  . COPD (chronic obstructive pulmonary disease)     PFT 2007 FEV1/FVC 43%  PFT 01/12/10 FEV1 1.0/51%, FVC 2.0/79%, FEV1/FVC 0.48  . Osteoarthritis   . DDD (degenerative disc disease)   . OSA on CPAP   . Seasonal allergies   . Asthma   . Diverticulitis    Past Surgical History:  Past Surgical History  Procedure Date  . Pleural scarification 1979    recurrent spontaneuous--L--- finally had tac pleurodesis  . Appendectomy   . Cholecystectomy   . Total knee arthroplasty     L  . Partial colectomy 2009    for diverticulitis  . Cataract extraction, bilateral     PT Assessment/Plan/Recommendation PT Assessment Clinical Impression Statement: Pt presents with diagnosis of urosepsis, general weakness. Pt will benefit from skilled PT in acute setting to improve strength, activity tolerance, and overall functional mobility in preparation for d/c.  PT Recommendation/Assessment: Patient will need skilled PT in the acute care venue PT Problem List: Decreased strength;Decreased activity tolerance;Decreased balance;Decreased mobility;Decreased knowledge of use of DME Barriers to Discharge: Decreased caregiver support PT Therapy Diagnosis : Difficulty walking;Generalized weakness PT Plan PT Frequency: Min 3X/week PT Treatment/Interventions: DME instruction;Gait training;Functional mobility training;Therapeutic activities;Therapeutic exercise;Balance training;Patient/family education PT Recommendation Recommendations for  Other Services: OT consult Follow Up Recommendations: Skilled nursing facility vs HHPT (depending on progress) Equipment Recommended: Defer to next venue PT Goals  Acute Rehab PT Goals PT Goal Formulation: With patient Time For Goal Achievement: 2 weeks Pt will go Supine/Side to Sit: with supervision PT Goal: Supine/Side to Sit - Progress: Goal set today Pt will go Sit to Supine/Side: with supervision PT Goal: Sit to Supine/Side - Progress: Goal set today Pt will go Sit to Stand: with supervision PT Goal: Sit to Stand - Progress: Goal set today Pt will Ambulate: >150 feet;with supervision;with least restrictive assistive device PT Goal: Ambulate - Progress: Goal set today Pt will Perform Home Exercise Program: with supervision, verbal cues required/provided PT Goal: Perform Home Exercise Program - Progress: Goal set today  PT Evaluation Precautions/Restrictions  Precautions Precautions: Fall Restrictions Weight Bearing Restrictions: No Prior Functioning  Home Living Lives With: Alone Available Help at Discharge: Family (live across the street) Type of Home: House Home Access: Stairs to enter [employer] of Steps: 3-front. deck has more steps Entrance Stairs-Rails: Right Home Layout: One level Home Adaptive Equipment: Straight cane;Crutches;Wheelchair - manual Prior Function Level of Independence: Independent Able to Take Stairs?: Yes Cognition Cognition Overall Cognitive Status: Appears within functional limits for tasks assessed/performed Arousal/Alertness: Awake/alert Orientation Level: Oriented X4 / Intact Behavior During Session: [facility] for tasks performed Sensation/Coordination Sensation Light Touch: Appears Intact Coordination Gross Motor Movements are Fluid and Coordinated: Yes Extremity Assessment RLE Strength RLE Overall Strength Comments: At least 4/5 with functional activity LLE Strength LLE Overall Strength Comments: At least 4/5 with functional  activity Mobility (including Balance) Bed Mobility Bed Mobility: Yes Supine to Sit: 4: Min assist;HOB elevated (Comment degrees);With rails Supine to Sit Details (  indicate cue type and reason): VCs safety, technique. Assist for trunk to upright and scoot to EOB Sit to Supine: 5: Supervision;HOB flat Sit to Supine - Details (indicate cue type and reason): VCs safety.  Transfers Transfers: Yes Sit to Stand: 4: Min assist;With upper extremity assist;From bed;From chair/3-in-1;With armrests Sit to Stand Details (indicate cue type and reason): VCs safety, technique, hand placement. Asssit to rise, stabilize in standing.  Stand to Sit: To chair/3-in-1;With armrests;To bed;4: Min assist Stand to Sit Details: VCs safety, technique, hand placement. Assist to control descent.  Stand Pivot Transfers: 4: Min assist Stand Pivot Transfer Details (indicate cue type and reason): recliner>bed. VCs safety. LOB x1 posteriorly during pivot.  Ambulation/Gait Ambulation/Gait: Yes Ambulation/Gait Assistance: 4: Min assist Ambulation/Gait Assistance Details (indicate cue type and reason): Assist to stabilze throughout ambulation. Pt c/o lightheadedness.  Ambulation Distance (Feet): 60 Feet Assistive device: Rolling walker Gait Pattern: Step-through pattern;Decreased stride length;Trunk flexed    Exercise    End of Session PT - End of Session Equipment Utilized During Treatment: Gait belt Activity Tolerance: Patient limited by fatigue Patient left: in bed;with call bell in reach Nurse Communication: Mobility status for transfers;Mobility status for ambulation General Behavior During Session: [facility] for tasks performed Cognition: [facility] for tasks performed  Rebeca Alert [facility] 04/26/2011, 10:40 AM [phone]

## 2011-04-26 NOTE — Progress Notes (Signed)
Clinical Social Work Department BRIEF PSYCHOSOCIAL ASSESSMENT 04/26/2011  Patient:  Nancy Cole, Nancy Cole     Account Number:  [id number]     Admit date:  04/25/2011  Clinical Social Worker:  Skip Mayer  Date/Time:  04/26/2011 02:00 PM  Referred by:  Care Management  Date Referred:  04/26/2011 Referred for  SNF Placement   Other Referral:   Interview type:  Patient Other interview type:   Pt's brother and sis-in-law at bedside during visit.    PSYCHOSOCIAL DATA Living Status:  ALONE Admitted from facility:   Level of care:   Primary support name:  Dorene Sorrow Primary support relationship to patient:  SIBLING Degree of support available:   Supportive    CURRENT CONCERNS Current Concerns  Post-Acute Placement   Other Concerns:    SOCIAL WORK ASSESSMENT / PLAN CSW met with pt, pt's brother, and sister-in-law re: PT recommendation for HH vs. SNF (depending on progress). Pt reports she lives across street from brother and sis-in-law. Brother reports they are able to provide 24hr assist if needed. Pt prefers to return home with family assist and HH through [place]. Pt reports agreeable to SNF search in [place], [facility], and [place] as b/u plan to home. Pt aware SNF must be approved by [facility]. CSW initatied insurance auth and SNF search. Will f/u with offers if SNF needed.   Assessment/plan status:  Information/Referral to [facility] Other assessment/ plan:   Information/referral to community resources:   SNF    PATIENT'S/FAMILY'S RESPONSE TO PLAN OF CARE: Pt reports agreeable to SNF search as b/u plan to home with [facility] and family assist. Pt's pref is home, but verbalized understanding that SNF may be needed prior to return home. Pt and pt's family pleasant and appreciative of CSW assist and visit.        Dellie Burns, MSW, LCSWA [phone] (covering)

## 2011-04-26 NOTE — Progress Notes (Signed)
Clinical Social Work Department CLINICAL SOCIAL WORK PLACEMENT NOTE 04/26/2011  Patient:  Nancy Cole, Nancy Cole  Account Number:  [id number] Admit date:  04/25/2011  Clinical Social Worker:  Dellie Burns, Theresia Majors  Date/time:  04/26/2011 02:00 PM  Clinical Social Work is seeking post-discharge placement for this patient at the following level of care:   SKILLED NURSING   (*CSW will update this form in Epic as items are completed)   04/26/2011  Patient/family provided with [facility] Department of Clinical Social Work's list of facilities offering this level of care within the geographic area requested by the patient (or if unable, by the patient's family).  04/26/2011  Patient/family informed of their freedom to choose among providers that offer the needed level of care, that participate in Medicare, Medicaid or managed care program needed by the patient, have an available bed and are willing to accept the patient.  04/26/2011  Patient/family informed of [facility]' ownership interest in [facility], as well as of the fact that they are under no obligation to receive care at this facility.  PASARR submitted to EDS on 04/26/2011 PASARR number received from EDS on   FL2 transmitted to all facilities in geographic area requested by pt/family on  04/26/2011 FL2 transmitted to all facilities within larger geographic area on   Patient informed that his/her managed care company has contracts with or will negotiate with  certain facilities, including the following:     Patient/family informed of bed offers received:   Patient chooses bed at  Physician recommends and patient chooses bed at    Patient to be transferred to  on   Patient to be transferred to facility by   The following physician request were entered in Epic:   Additional Comments:  Dellie Burns, MSW, LCSWA [phone] (covering)

## 2011-04-26 NOTE — Progress Notes (Signed)
   CARE MANAGEMENT NOTE 04/26/2011  Patient:  Nancy Cole, Nancy Cole   Account Number:  [id number]  Date Initiated:  04/26/2011  Documentation initiated by:  Lanier Clam  Subjective/Objective Assessment:   ADMITTED W/FALL,HYPOTENSION     Action/Plan:   FROM HOME,ALONE.   Anticipated DC Date:  04/30/2011   Anticipated DC Plan:  SKILLED NURSING FACILITY  In-house referral  Clinical Social Worker         Choice offered to / List presented to:             Status of service:  In process, will continue to follow Medicare Important Message given?   (If response is "NO", the following Medicare IM given date fields will be blank) Date Medicare IM given:   Date Additional Medicare IM given:    Discharge Disposition:    Per UR Regulation:  Reviewed for med. necessity/level of care/duration of stay  If discussed at Long Length of Stay Meetings, dates discussed:    Comments:  04/26/11 Langston Tuberville RN,BSN NCM [phone] PT-SNF VS HH. PATIENT AGREE SNF. CSW NOTIFIED.

## 2011-04-26 NOTE — Progress Notes (Signed)
04/25/11- received report from Minerva Areola, RN in ER dept on Ms Joswick to be admitted to Rm#[id number] with dx: Urosepsis. Pt arrived to [address] at 21:45;via stretcher for telemetry monitoring. BP remains low as reported in ER dept. Pt a/o x4; but very weak, had received 3 resp tx's since in ER today--no resp distress at this time, 02 on at 2l/[place]. Pt assessment & hx to be completed, & fall safety plan explained to pt & her dtr in law, pt will sign form when made available to her after getting her settled.

## 2011-04-26 NOTE — Progress Notes (Signed)
Subjective: Admitted with Pyleonephritis/Urosepsis/Leukocytosis/Azotemia/DeHydration/Hypokalemia/Falls  S/p IV Cipro and IVF.  Feeling some better Diarrhea controlled. No new complaints.  sore  Objective: Vital signs in last 24 hours: Temp:  [97.8 F (36.6 C)-98.9 F (37.2 C)] 98.7 F (37.1 C) (04/18 0500) Pulse Rate:  [108-124] 108  (04/18 0500) Resp:  [18-25] 18  (04/18 0500) BP: (77-107)/(47-76) 96/61 mmHg (04/18 0500) SpO2:  [85 %-97 %] 94 % (04/18 0500) FiO2 (%):  [2 %] 2 % (04/17 2159) Weight:  [74.3 kg (163 lb 12.8 oz)-74.5 kg (164 lb 3.9 oz)] 74.5 kg (164 lb 3.9 oz) (04/18 0500) Weight change:  Last BM Date: 04/25/11  CBG (last 3)   Basename 04/25/11 2143  GLUCAP 113*    Intake/Output from previous day:  Intake/Output Summary (Last 24 hours) at 04/26/11 0715 Last data filed at 04/26/11 0555  Gross per 24 hour  Intake    945 ml  Output    325 ml  Net    620 ml   04/17 0701 - 04/18 0700 In: 945 [P.O.:120; I.V.:625; IV Piggyback:200] Out: 325 [Urine:325]   Physical Exam General appearance: Better color and looks better than last night in the ED.  OP - Dry but more moisture than last night..  Neck: no adenopathy, no carotid bruit, no JVD and thyroid not enlarged, symmetric, no tenderness/mass/nodules  Resp: Tachypnea, clearer.  O2 is off at moment and she is breathing OK  Cardio: reg Tachy, Sinus tachy on Monitor.  GI: soft, non-tender; bowel sounds normal; no masses, no organomegaly  R Flank pain and CVA tenderness.  Extremities: extremities normal, atraumatic, no cyanosis or edema  Pulses: 2+ and symmetric  Lymph nodes: no cervical lymphadenopathy  Neurologic: Alert and oriented X 3, normal strength and tone. Normal symmetric reflexes.     Lab Results:  [facility] 04/26/11 0440 04/25/11 1455  NA 138 138  K 3.9 3.2*  CL 105 105  CO2 20 18*  GLUCOSE 95 112*  BUN 35* 24*  CREATININE 1.98* 2.15*  CALCIUM 7.9* 8.5  MG -- --  PHOS -- --      Basename 04/26/11 0440 04/25/11 1455  AST 57* 62*  ALT 31 33  ALKPHOS 62 67  BILITOT 0.5 0.6  PROT 5.8* 5.9*  ALBUMIN 2.9* 3.2*     Basename 04/26/11 0440 04/25/11 1455  WBC 32.8* 36.5*  NEUTROABS -- 34.7*  HGB 11.7* 13.1  HCT 35.0* 38.9  MCV 88.8 88.2  PLT 154 157    No results found for this basename: INR, PROTIME     Basename 04/25/11 1851 04/25/11 1455  CKTOTAL 855* --  CKMB -- --  CKMBINDEX -- --  TROPONINI -- <0.30    No results found for this basename: TSH,T4TOTAL,FREET3,T3FREE,THYROIDAB in the last 72 hours  No results found for this basename: VITAMINB12:2,FOLATE:2,FERRITIN:2,TIBC:2,IRON:2,RETICCTPCT:2 in the last 72 hours  Micro Results: No results found for this or any previous visit (from the past 240 hour(s)).   Studies/Results: Ct Head Wo Contrast  04/25/2011  *RADIOLOGY REPORT*  Clinical Data: Nausea and emesis.  Fall.  CT HEAD WITHOUT CONTRAST  Technique:  Contiguous axial images were obtained from the base of the skull through the vertex without contrast.  Comparison: None.  Findings: Lacunar infarcts of the cerebellum bilaterally are age indeterminate.  A larger area of hypoattenuation in the inferior left cerebellum also likely represents ischemia, age indeterminate. There is slight prominence of the extra-axial CSF within the left posterior fossa, likely representing an arachnoid cyst. A focal  calcification within the left temporal lobe measures 6 mm on image 14.  Moderate periventricular and diffuse confluent subcortical white matter hypoattenuation is present bilaterally.  No acute supratentorial cortical infarct, hemorrhage, or mass lesion is present.  The ventricles are proportionate to the degree of atrophy.  No significant extra-axial fluid collection is present.  The paranasal sinuses and mastoid air cells are clear.  The osseous skull is intact.  IMPRESSION:  1.  Age indeterminate lacunar infarcts of the cerebellum bilaterally. 2.  Age  indeterminate left inferior cerebellar infarct, measuring up to 2.5 cm. 3.  Atrophy and extensive white matter disease within the supratentorial brain.  This likely reflects the sequelae of chronic microvascular ischemia. 4.  Focal calcification within the left temporal lobe.  This may be related to prior infection or contusion.  Neoplasm is considered less likely.  MRI without and with contrast would be useful for further evaluation as clinically indicated.  Original Report Authenticated By: Jamesetta Orleans. MATTERN, M.D.   Dg Chest Port 1 View  04/25/2011  *RADIOLOGY REPORT*  Clinical Data: Shortness of breath and cough.  PORTABLE CHEST - 1 VIEW  Comparison: None.  Findings: Streaky bibasilar airspace opacities are most consistent with subsegmental atelectasis.  Lungs otherwise appear clear. Heart size is normal.  No pneumothorax or pleural fluid.  IMPRESSION: Streaky bibasilar airspace opacity most consistent with subsegmental atelectasis.  Original Report Authenticated By: Bernadene Bell. Maricela Curet, M.D.     Medications: Scheduled:   . albuterol  2.5 mg Nebulization Once  . albuterol  5 mg Nebulization Once  . albuterol  5 mg Nebulization Once  . antiseptic oral rinse  15 mL Mouth Rinse BID  . cholecalciferol  2,000 Units Oral Daily  . ciprofloxacin  200 mg Intravenous Once  . ciprofloxacin  200 mg Intravenous Q12H  . enoxaparin  30 mg Subcutaneous Q24H  . fluticasone  2 spray Each Nare Daily  . ipratropium  0.5 mg Nebulization Once  . ipratropium  0.5 mg Nebulization Once  . ipratropium  0.5 mg Nebulization Q6H  . levothyroxine  100 mcg Oral QAC breakfast  . loratadine  10 mg Oral Daily  . montelukast  10 mg Oral QHS  . pantoprazole  40 mg Oral Q1200  . potassium chloride  20 mEq Oral Daily  . sertraline  100 mg Oral Daily  . sodium chloride  250 mL Intravenous Once  . sodium chloride  500 mL Intravenous Once  . sodium chloride  3 mL Intravenous Q12H  . tiotropium  18 mcg Inhalation Daily   . tiotropium  18 mcg Inhalation Daily  . DISCONTD: cromolyn  1 spray Each Nare TID  . DISCONTD: Vitamin D  1 capsule Oral Daily   Continuous:   . sodium chloride Stopped (04/25/11 2045)  . 0.9 % NaCl with KCl 20 mEq / L 100 mL/hr at 04/26/11 0510     Assessment/Plan: Principal Problem:  *Urosepsis Active Problems:  OBSTRUCTIVE SLEEP APNEA  ASTHMA  COPD  Dehydration  Hypotension  Pyelonephritis  Azotemia  Leukocytosis  Problem #1: Pyleonephritis/Urosepsis/Leukocytosis/Azotemia/DeHydration/Hypokalemia/Falls/Rhabdomyolysis Continue IV Cipro and IVF. Add one dose IV Vanco awaiting cxs. Continue tele.  Rhabdo + with CK 855. Myoglobin 1462. K replaced  Cr down to 1.98 WBC down to 32.8 Check Kidney bladder US.  Cultures Pending She does not need HCO3 gtt.  Continue Home meds where appropriate.   Problem # 2: ASTHMA (ICD-493.90)/COPD  Formerly steroid dependent Dr Maple Hudson [phone].  She is not acting adrenally insufficient but  add am cortisol. (-) Alpha 1 AntiTrypsin  PFTs 01/12/10 just prior to surgery - FEV1/FVC 0.48  Continue O2, Nebs, HFAs and IS  Problem # 3: HYPOTHYROIDISM (ICD-244.9) -Continue Synthroid.  Problem # 4: OBSTRUCTIVE SLEEP APNEA (ICD-327.23)  OSA supposed to be on BiPAP/CPAP = poorly compliant May try to restart when she can handle.  Problem # 5: DIARRHEA (ICD-787.91) - currently not much of an issue.  With WBC C Dif ordered. Unlikely to be +  Problem # 6: DEPRESSION, MILD (ICD-311)  continue Zoloft 100 Mg Tabs (Sertraline hcl) .Marland Kitchen... 1 po qd   Problem #7: BACK PAIN, LUMBAR (ICD-724.2) - S/P ESI, uses pain meds at home.  Problem # 8: GERD (ICD-530.81) PPI   Problem 9 - DVT proph.   Problem #10 - CVAs on CCT - will work on RF reduction when she is better. May need MRI - discussed   She will probably be inpt until Monday and if physical function does not improve she may need short term rehab.     ID -  Anti-infectives     Start     Dose/Rate  Route Frequency Ordered Stop   04/25/11 2200   ciprofloxacin (CIPRO) IVPB 200 mg        200 mg 100 mL/hr over 60 Minutes Intravenous Every 12 hours 04/25/11 1940 05/02/11 2159   04/25/11 1630   ciprofloxacin (CIPRO) IVPB 200 mg        200 mg 100 mL/hr over 60 Minutes Intravenous  Once 04/25/11 1615 04/25/11 1746          LOS: 1 day   Telia Amundson M 04/26/2011, 7:15 AM

## 2011-04-26 NOTE — Progress Notes (Signed)
   CARE MANAGEMENT NOTE 04/26/2011  Patient:  Nancy Cole, Nancy Cole   Account Number:  [id number]  Date Initiated:  04/26/2011  Documentation initiated by:  Lanier Clam  Subjective/Objective Assessment:   ADMITTED W/FALL,HYPOTENSION     Action/Plan:   FROM HOME,ALONE.   Anticipated DC Date:  04/30/2011   Anticipated DC Plan:  HOME W HOME HEALTH SERVICES         Choice offered to / List presented to:             Status of service:  In process, will continue to follow Medicare Important Message given?   (If response is "NO", the following Medicare IM given date fields will be blank) Date Medicare IM given:   Date Additional Medicare IM given:    Discharge Disposition:    Per UR Regulation:  Reviewed for med. necessity/level of care/duration of stay  If discussed at Long Length of Stay Meetings, dates discussed:    Comments:  04/26/11 [facility] RN,BSN NCM [phone]

## 2011-04-27 LAB — MYOGLOBIN, SERUM: Myoglobin: 428 ng/mL — ABNORMAL HIGH (ref <111)

## 2011-04-27 LAB — URINE CULTURE: Culture  Setup Time: 201304172152

## 2011-04-27 LAB — COMPREHENSIVE METABOLIC PANEL
ALT: 27 U/L (ref 0–35)
Albumin: 2.8 g/dL — ABNORMAL LOW (ref 3.5–5.2)
Alkaline Phosphatase: 77 U/L (ref 39–117)
BUN: 29 mg/dL — ABNORMAL HIGH (ref 6–23)
Calcium: 8.5 mg/dL (ref 8.4–10.5)
Potassium: 4 mEq/L (ref 3.5–5.1)
Sodium: 139 mEq/L (ref 135–145)
Total Protein: 5.9 g/dL — ABNORMAL LOW (ref 6.0–8.3)

## 2011-04-27 LAB — CBC
MCHC: 32.7 g/dL (ref 30.0–36.0)
RDW: 14.6 % (ref 11.5–15.5)

## 2011-04-27 MED ORDER — ENOXAPARIN SODIUM 40 MG/0.4ML ~~LOC~~ SOLN
40.0000 mg | SUBCUTANEOUS | Status: DC
  Administered 2011-04-27 – 2011-05-01 (×5): 40 mg via SUBCUTANEOUS
  Filled 2011-04-27 (×6): qty 0.4

## 2011-04-27 NOTE — Social Work (Signed)
Spoke to Ms. Blizard briefly. She is leaning towards SNF placement for rehab. Today her clinical information was sent to [facility] for skilled facility authorization.  Delphia Grates, Product manager  Clinical Social Work

## 2011-04-27 NOTE — Evaluation (Signed)
Occupational Therapy Evaluation Patient Details Name: Nancy Cole MRN: [id number] DOB: 10/31/1934 Today's Date: 04/27/2011 Time: 8413-2440 OT Time Calculation (min): 45 min  OT Assessment / Plan / Recommendation Clinical Impression  This 77 year old female was admitted with urosepsis.  She is currently on 2 liters 02, and has dyspnea with activity.  She would benefit from continued OT with supervision goals in acute.    OT Assessment  Patient needs continued OT Services    Follow Up Recommendations  Skilled nursing facility;Other (comment) (vs. HHOT with 24/7 initially)    Equipment Recommendations  Defer to next venue (possibly 3:1 commode--to be further assessed)    Frequency Min 2X/week    Precautions / Restrictions Precautions Precautions: Fall Restrictions Weight Bearing Restrictions: No   Pertinent Vitals/Pain 0 pain    ADL  Eating/Feeding: Simulated;Independent Where Assessed - Eating/Feeding: Chair Grooming: Simulated;Set up Where Assessed - Grooming: Supported sitting Upper Body Bathing: Performed;Set up Where Assessed - Upper Body Bathing: Sitting, chair;Supported Lower Body Bathing: Performed;Moderate assistance;Other (comment) (pt 70%) Where Assessed - Lower Body Bathing: Sit to stand from chair Upper Body Dressing: Simulated;Minimal assistance;Other (comment) (lines) Where Assessed - Upper Body Dressing: Sitting, chair;Supported Lower Body Dressing: Performed;Moderate assistance;Other (comment) (tried sock aid; pt needs wider model) Where Assessed - Lower Body Dressing: Sit to stand from chair Toilet Transfer: Not assessed Toileting - Clothing Manipulation: Simulated;Supervision/safety Where Assessed - Toileting Clothing Manipulation: Standing Toileting - Hygiene: Simulated;Minimal assistance Where Assessed - Toileting Hygiene: Sit to stand from 3-in-1 or toilet;Other (comment) (has difficulty reaching posterior due to back/shoulder limit) Tub/Shower  Transfer: Not assessed Equipment Used: Sock aid;Rolling walker ADL Comments: Performed ADL and washing hair with shampoo cap.  Pt has dyspnea 2/4 and rest breaks given.  Discussed energy conservation.  Pt does plan and prioritize already.      OT Goals Acute Rehab OT Goals OT Goal Formulation: With patient Time For Goal Achievement: 05/11/11 Potential to Achieve Goals: Good ADL Goals Pt Will Perform Grooming: with supervision;Standing at sink ADL Goal: Grooming - Progress: Goal set today Pt Will Perform Lower Body Bathing: with supervision;Sit to stand from chair;with adaptive equipment ADL Goal: Lower Body Bathing - Progress: Goal set today Pt Will Perform Lower Body Dressing: with supervision;Sit to stand from chair;with adaptive equipment ADL Goal: Lower Body Dressing - Progress: Goal set today Pt Will Transfer to Toilet: with supervision;Ambulation;3-in-1;Regular height toilet ADL Goal: Toilet Transfer - Progress: Goal set today Pt Will Perform Toileting - Clothing Manipulation: with supervision;Standing ADL Goal: Toileting - Clothing Manipulation - Progress: Goal set today Pt Will Perform Toileting - Hygiene: with supervision;Sit to stand from 3-in-1/toilet ADL Goal: Toileting - Hygiene - Progress: Goal set today Pt Will Perform Tub/Shower Transfer: with supervision;Shower seat with back;Anterior-posterior transfer;Shower transfer ADL Goal: Web designer - Progress: Goal set today Miscellaneous OT Goals Miscellaneous OT Goal #1: Pt will state 3 energy conservation techniques OT Goal: Miscellaneous Goal #1 - Progress: Goal set today  Visit Information  Last OT Received On: 04/27/11 Assistance Needed: +1    Subjective Data  Subjective: "I had trouble with my socks at home because of my back" Patient Stated Goal: wants to go home but will consider rehab if needed   Prior Functioning  Home Living Lives With: Alone Available Help at Discharge: Family Type of Home:  House Home Access: Stairs to enter [employer] of Steps: 3-front. deck has more steps Entrance Stairs-Rails: Right Home Layout: One level Bathroom Shower/Tub: Tub/shower unit;Walk-in shower;Other (comment) (usually  stands in her tub/shower) Firefighter: Standard Home Adaptive Equipment: Straight cane;Crutches;Wheelchair - manual Prior Function Level of Independence: Independent Able to Take Stairs?: Yes Communication Communication: No difficulties Dominant Hand: Right    Cognition  Overall Cognitive Status: Appears within functional limits for tasks assessed/performed Arousal/Alertness: Awake/alert Orientation Level: Oriented X4 / Intact Behavior During Session: [facility] for tasks performed    Extremity/Trunk Assessment Right Upper Extremity Assessment RUE ROM/Strength/Tone: Within functional levels Left Upper Extremity Assessment LUE ROM/Strength/Tone: Within functional levels (h/o "cracking" shoulderr 5 places:  decreased internal rotation)   Mobility Bed Mobility Bed Mobility DO NOT USE: Yes Supine to Sit: 4: Min assist;HOB elevated (Comment degrees);With rails Sit to Supine: 5: Supervision;HOB flat Transfers Sit to Stand: 4: Min guard;With armrests;From chair/3-in-1 Stand to Sit: To chair/3-in-1;With armrests;To bed;4: Min assist   Exercise    Balance Balance Balance Assessed: Yes Dynamic Standing Balance Dynamic Standing - Comments: min guard for balance  End of Session OT - End of Session Activity Tolerance: Other (comment) (needs lots of rest breaks due to dyspnea) Patient left: in chair;with call bell/phone within reach  [facility], OTR/L [phone] 04/27/2011 Elihu Milstein 04/27/2011, 10:32 AM

## 2011-04-27 NOTE — Progress Notes (Signed)
Subjective: Admitted with Pyleonephritis/Urosepsis/Leukocytosis/Azotemia/DeHydration/Hypokalemia/Falls  S/p IV Cipro, IV Vanco x one, and IVF.  Feeling some better Diarrhea controlled.  C dif (-) No new complaints.  Weak and sore.  Objective: Vital signs in last 24 hours: Temp:  [98.1 F (36.7 C)-99.8 F (37.7 C)] 98.9 F (37.2 C) (04/19 0520) Pulse Rate:  [92-95] 92  (04/19 0520) Resp:  [20] 20  (04/19 0520) BP: (98-126)/(66-80) 126/80 mmHg (04/19 0520) SpO2:  [96 %] 96 % (04/19 0520) Weight:  [74.8 kg (164 lb 14.5 oz)] 74.8 kg (164 lb 14.5 oz) (04/19 0520) Weight change: 0.5 kg (1 lb 1.6 oz) Last BM Date: 04/26/11  CBG (last 3)   Basename 04/25/11 2143  GLUCAP 113*    Intake/Output from previous day:  Intake/Output Summary (Last 24 hours) at 04/27/11 0727 Last data filed at 04/27/11 0720  Gross per 24 hour  Intake    600 ml  Output   1700 ml  Net  -1100 ml   04/18 0701 - 04/19 0700 In: 600 [P.O.:600] Out: 1400 [Urine:1400]   Physical Exam General appearance: Sitting in chair and looks better but weak Resp: Clear   Cardio: reg Tachy, Sinus tachy on Monitor.  GI: soft, non-tender; bowel sounds normal; no masses, no organomegaly  R Flank pain and CVA tenderness.  Extremities: extremities normal, atraumatic, no cyanosis or edema  Pulses: 2+ and symmetric  Lymph nodes: no cervical lymphadenopathy  Neurologic: Alert and oriented X 3, normal strength and tone. Normal symmetric reflexes.     Lab Results:  [facility] 04/27/11 0430 04/26/11 0440  NA 139 138  K 4.0 3.9  CL 111 105  CO2 19 20  GLUCOSE 74 95  BUN 29* 35*  CREATININE 1.04 1.98*  CALCIUM 8.5 7.9*  MG -- --  PHOS -- --     Basename 04/27/11 0430 04/26/11 0440  AST 44* 57*  ALT 27 31  ALKPHOS 77 62  BILITOT 0.3 0.5  PROT 5.9* 5.8*  ALBUMIN 2.8* 2.9*     Basename 04/27/11 0430 04/26/11 0440 04/25/11 1455  WBC 27.8* 32.8* --  NEUTROABS -- -- 34.7*  HGB 10.6* 11.7* --  HCT 32.4* 35.0*  --  MCV 90.3 88.8 --  PLT 154 154 --    No results found for this basename: INR,  PROTIME     Basename 04/26/11 0825 04/25/11 1851 04/25/11 1455  CKTOTAL 857* 855* --  CKMB -- -- --  CKMBINDEX -- -- --  TROPONINI -- -- <0.30    No results found for this basename: TSH,T4TOTAL,FREET3,T3FREE,THYROIDAB in the last 72 hours  No results found for this basename: VITAMINB12:2,FOLATE:2,FERRITIN:2,TIBC:2,IRON:2,RETICCTPCT:2 in the last 72 hours  Micro Results: Recent Results (from the past 240 hour(s))  URINE CULTURE     Status: Normal (Preliminary result)   Collection Time   04/25/11  2:33 PM      Component Value Range Status Comment   Specimen Description URINE, CATHETERIZED   Final    Special Requests NONE   Final    Culture  Setup Time [id number]   Final    Colony Count >=100,000 COLONIES/ML   Final    Culture ESCHERICHIA COLI   Final    Report Status PENDING   Incomplete   CLOSTRIDIUM DIFFICILE BY PCR     Status: Normal   Collection Time   04/26/11  9:15 AM      Component Value Range Status Comment   C difficile by pcr NEGATIVE  NEGATIVE  Final  Studies/Results: Dg Chest 1 View  04/26/2011  *RADIOLOGY REPORT*  Clinical Data: Shortness of breath  CHEST - 1 VIEW  Comparison: 04/25/2011  Findings: Cardiomediastinal silhouette is stable.  No pulmonary edema.  Persistent streaky bilateral basilar atelectasis or infiltrate.  IMPRESSION: No pulmonary edema.  Persistent streaky bilateral basilar atelectasis or infiltrate.  Original Report Authenticated By: Natasha Mead, M.D.   Ct Head Wo Contrast  04/25/2011  *RADIOLOGY REPORT*  Clinical Data: Nausea and emesis.  Fall.  CT HEAD WITHOUT CONTRAST  Technique:  Contiguous axial images were obtained from the base of the skull through the vertex without contrast.  Comparison: None.  Findings: Lacunar infarcts of the cerebellum bilaterally are age indeterminate.  A larger area of hypoattenuation in the inferior left cerebellum also likely  represents ischemia, age indeterminate. There is slight prominence of the extra-axial CSF within the left posterior fossa, likely representing an arachnoid cyst. A focal calcification within the left temporal lobe measures 6 mm on image 14.  Moderate periventricular and diffuse confluent subcortical white matter hypoattenuation is present bilaterally.  No acute supratentorial cortical infarct, hemorrhage, or mass lesion is present.  The ventricles are proportionate to the degree of atrophy.  No significant extra-axial fluid collection is present.  The paranasal sinuses and mastoid air cells are clear.  The osseous skull is intact.  IMPRESSION:  1.  Age indeterminate lacunar infarcts of the cerebellum bilaterally. 2.  Age indeterminate left inferior cerebellar infarct, measuring up to 2.5 cm. 3.  Atrophy and extensive white matter disease within the supratentorial brain.  This likely reflects the sequelae of chronic microvascular ischemia. 4.  Focal calcification within the left temporal lobe.  This may be related to prior infection or contusion.  Neoplasm is considered less likely.  MRI without and with contrast would be useful for further evaluation as clinically indicated.  Original Report Authenticated By: Jamesetta Orleans. MATTERN, M.D.   US Renal  04/26/2011  *RADIOLOGY REPORT*  Clinical Data: Renal failure, dehydration.  RENAL/URINARY TRACT ULTRASOUND COMPLETE  Comparison:  None.  Findings:  Right Kidney:  11.1 cm with normal parenchymal echogenicity.  No hydronephrosis.  Left Kidney:  10.9 cm with normal parenchymal echogenicity.  There is mild renal cortical thinning.  No hydronephrosis.  Bladder:  Normal.  IMPRESSION: No acute findings.  Possible left renal cortical thinning.  Original Report Authenticated By: Reyes Ivan, M.D.   Dg Chest Port 1 View  04/25/2011  *RADIOLOGY REPORT*  Clinical Data: Shortness of breath and cough.  PORTABLE CHEST - 1 VIEW  Comparison: None.  Findings: Streaky  bibasilar airspace opacities are most consistent with subsegmental atelectasis.  Lungs otherwise appear clear. Heart size is normal.  No pneumothorax or pleural fluid.  IMPRESSION: Streaky bibasilar airspace opacity most consistent with subsegmental atelectasis.  Original Report Authenticated By: Bernadene Bell. Maricela Curet, M.D.     Medications: Scheduled:    . antiseptic oral rinse  15 mL Mouth Rinse BID  . cholecalciferol  2,000 Units Oral Daily  . ciprofloxacin  200 mg Intravenous Q12H  . enoxaparin  30 mg Subcutaneous Q24H  . fluticasone  2 spray Each Nare Daily  . levothyroxine  100 mcg Oral QAC breakfast  . loratadine  10 mg Oral Daily  . montelukast  10 mg Oral QHS  . pantoprazole  40 mg Oral Q1200  . potassium chloride  20 mEq Oral Daily  . sertraline  100 mg Oral Daily  . sodium chloride  3 mL Intravenous Q12H  . tiotropium  18 mcg Inhalation Daily  . vancomycin  1,000 mg Intravenous Once  . DISCONTD: ipratropium  0.5 mg Nebulization Q6H  . DISCONTD: tiotropium  18 mcg Inhalation Daily   Continuous:    . 0.9 % NaCl with KCl 20 mEq / L 100 mL/hr at 04/27/11 0500  . DISCONTD: sodium chloride Stopped (04/25/11 2045)     Assessment/Plan: Principal Problem:  *Urosepsis Active Problems:  OBSTRUCTIVE SLEEP APNEA  ASTHMA  COPD  Dehydration  Hypotension  Pyelonephritis  Azotemia  Leukocytosis  Problem #1: Pyleonephritis/Urosepsis/Leukocytosis/Azotemia/DeHydration/Hypokalemia/Falls/Rhabdomyolysis cx = E coli  Continue IV Cipro and IVF. S/P one dose IV Vanco awaiting cxs. Continue tele.  Rhabdo + with CK 855. Myoglobin 1462 resolving. K replaced  Cr down to 1.04 WBC down to 27.8 [place] o/w (-).  Hbg 10.6  Over weekend work on strength, get sensitivities of Ecoli and move towards Oral Abx, wean FIO2 and IVF's, and OK to D/C Tele when she is ready.  Potential D/C Monday and Tuesday.  Continue Home meds where appropriate.   Problem # 2: ASTHMA (ICD-493.90)/COPD  Formerly  steroid dependent Dr Maple Hudson [phone].  She is not acting adrenally insufficient am cortisol fine (-) Alpha 1 AntiTrypsin  PFTs 01/12/10 just prior to surgery - FEV1/FVC 0.48  Continue O2, Nebs, HFAs and IS  Problem # 3: HYPOTHYROIDISM (ICD-244.9) -Continue Synthroid.  Problem # 4: OBSTRUCTIVE SLEEP APNEA (ICD-327.23)  OSA supposed to be on BiPAP/CPAP = poorly compliant May try to restart when she can handle.  Problem # 5: DIARRHEA (ICD-787.91) - currently not much of an issue.  C Dif (-) - No isolation needed.  Problem # 6: DEPRESSION, MILD (ICD-311)  continue Zoloft 100 Mg Tabs (Sertraline hcl) .Marland Kitchen... 1 po qd   Problem #7: BACK PAIN, LUMBAR (ICD-724.2) - S/P ESI, uses pain meds at home.  Problem # 8: GERD (ICD-530.81) PPI   Problem 9 - DVT proph.   Problem #10 - CVAs on CCT - will work on RF reduction when she is better. May need MRI.  She will probably be inpt until Monday and if physical function does not improve she may need short term rehab.  See PT note and CW/SW notes.  OT ordered.    ID -  Anti-infectives     Start     Dose/Rate Route Frequency Ordered Stop   04/26/11 0800   vancomycin (VANCOCIN) IVPB 1000 mg/200 mL premix        1,000 mg 200 mL/hr over 60 Minutes Intravenous  Once 04/26/11 0729 04/26/11 0912   04/25/11 2200   ciprofloxacin (CIPRO) IVPB 200 mg        200 mg 100 mL/hr over 60 Minutes Intravenous Every 12 hours 04/25/11 1940 05/02/11 2159   04/25/11 1630   ciprofloxacin (CIPRO) IVPB 200 mg        200 mg 100 mL/hr over 60 Minutes Intravenous  Once 04/25/11 1615 04/25/11 1746          LOS: 2 days   Clydie Dillen M 04/27/2011, 7:27 AM

## 2011-04-28 LAB — CBC
HCT: 34.6 % — ABNORMAL LOW (ref 36.0–46.0)
Hemoglobin: 11.3 g/dL — ABNORMAL LOW (ref 12.0–15.0)
MCV: 89.2 fL (ref 78.0–100.0)
RDW: 14.4 % (ref 11.5–15.5)
WBC: 21 10*3/uL — ABNORMAL HIGH (ref 4.0–10.5)

## 2011-04-28 LAB — COMPREHENSIVE METABOLIC PANEL
Albumin: 2.7 g/dL — ABNORMAL LOW (ref 3.5–5.2)
Alkaline Phosphatase: 97 U/L (ref 39–117)
BUN: 19 mg/dL (ref 6–23)
CO2: 20 mEq/L (ref 19–32)
Chloride: 108 mEq/L (ref 96–112)
Creatinine, Ser: 0.84 mg/dL (ref 0.50–1.10)
GFR calc Af Amer: 77 mL/min — ABNORMAL LOW (ref >90)
GFR calc non Af Amer: 66 mL/min — ABNORMAL LOW (ref >90)
Glucose, Bld: 78 mg/dL (ref 70–99)
Total Bilirubin: 0.4 mg/dL (ref 0.3–1.2)

## 2011-04-28 MED ORDER — CIPROFLOXACIN HCL 500 MG PO TABS
500.0000 mg | ORAL_TABLET | Freq: Two times a day (BID) | ORAL | Status: DC
  Administered 2011-04-29 – 2011-05-02 (×7): 500 mg via ORAL
  Filled 2011-04-28 (×9): qty 1

## 2011-04-28 MED ORDER — POTASSIUM CHLORIDE IN NACL 20-0.9 MEQ/L-% IV SOLN
INTRAVENOUS | Status: AC
  Administered 2011-04-28: 12:00:00 via INTRAVENOUS
  Filled 2011-04-28: qty 1000

## 2011-04-28 MED ORDER — FLORA-Q PO CAPS
1.0000 | ORAL_CAPSULE | Freq: Every day | ORAL | Status: DC
  Administered 2011-04-28 – 2011-05-02 (×5): 1 via ORAL
  Filled 2011-04-28 (×5): qty 1

## 2011-04-28 NOTE — Progress Notes (Signed)
Spoke with Pt re: d/c plans.  Pt states that it's her understanding that she will d/c on Monday.  Pt further stated that she hasn't decided where she would like to go upon d/c and asked that she have the weekend to consider her options.  Pt declined to have CSW call family, stating, "The doctor says it's my decision."  CSW to continue to follow.  Providence Crosby, LCSWA Clinical Social Work [phone]

## 2011-04-28 NOTE — Progress Notes (Signed)
Subjective: Feels better than when she came in.  No pain now except a mild HA which is improving.  Objective: Vital signs in last 24 hours: Temp:  [98.1 F (36.7 C)-99.1 F (37.3 C)] 99.1 F (37.3 C) (04/20 0600) Pulse Rate:  [82-88] 82  (04/20 0600) Resp:  [16-20] 20  (04/20 0600) BP: (120-136)/(80-85) 127/85 mmHg (04/20 0600) SpO2:  [90 %-96 %] 92 % (04/20 0759) FiO2 (%):  [21 %] 21 % (04/20 0759) Weight:  [74.9 kg (165 lb 2 oz)] 74.9 kg (165 lb 2 oz) (04/20 0600) Weight change: 0.1 kg (3.5 oz) Last BM Date: 04/27/11  Intake/Output from previous day: 04/19 0701 - 04/20 0700 In: 700 [P.O.:120; I.V.:480; IV Piggyback:100] Out: 600 [Urine:600] Intake/Output this shift:    General appearance: alert, cooperative and appears stated age Resp: clear to auscultation bilaterally Cardio: regular rate and rhythm, S1, S2 normal, no murmur, click, rub or gallop GI: soft, non-tender; bowel sounds normal; no masses,  no organomegaly Extremities: extremities normal, atraumatic, no cyanosis or edema Neurologic: Grossly normal   Lab Results:  Basename 04/28/11 0439 04/27/11 0430  WBC 21.0* 27.8*  HGB 11.3* 10.6*  HCT 34.6* 32.4*  PLT 177 154   BMET  Basename 04/28/11 0439 04/27/11 0430  NA 136 139  K 4.1 4.0  CL 108 111  CO2 20 19  GLUCOSE 78 74  BUN 19 29*  CREATININE 0.84 1.04  CALCIUM 8.7 8.5   CMET CMP     Component Value Date/Time   NA 136 04/28/2011 0439   K 4.1 04/28/2011 0439   CL 108 04/28/2011 0439   CO2 20 04/28/2011 0439   GLUCOSE 78 04/28/2011 0439   BUN 19 04/28/2011 0439   CREATININE 0.84 04/28/2011 0439   CALCIUM 8.7 04/28/2011 0439   PROT 5.8* 04/28/2011 0439   ALBUMIN 2.7* 04/28/2011 0439   AST 31 04/28/2011 0439   ALT 26 04/28/2011 0439   ALKPHOS 97 04/28/2011 0439   BILITOT 0.4 04/28/2011 0439   GFRNONAA 66* 04/28/2011 0439   GFRAA 77* 04/28/2011 0439     Studies/Results: Dg Chest 1 View  04/26/2011  *RADIOLOGY REPORT*  Clinical Data: Shortness of  breath  CHEST - 1 VIEW  Comparison: 04/25/2011  Findings: Cardiomediastinal silhouette is stable.  No pulmonary edema.  Persistent streaky bilateral basilar atelectasis or infiltrate.  IMPRESSION: No pulmonary edema.  Persistent streaky bilateral basilar atelectasis or infiltrate.  Original Report Authenticated By: Natasha Mead, M.D.   US Renal  04/26/2011  *RADIOLOGY REPORT*  Clinical Data: Renal failure, dehydration.  RENAL/URINARY TRACT ULTRASOUND COMPLETE  Comparison:  None.  Findings:  Right Kidney:  11.1 cm with normal parenchymal echogenicity.  No hydronephrosis.  Left Kidney:  10.9 cm with normal parenchymal echogenicity.  There is mild renal cortical thinning.  No hydronephrosis.  Bladder:  Normal.  IMPRESSION: No acute findings.  Possible left renal cortical thinning.  Original Report Authenticated By: Reyes Ivan, M.D.    Medications: I have reviewed the patient's current medications.  Scheduled Meds:   . antiseptic oral rinse  15 mL Mouth Rinse BID  . cholecalciferol  2,000 Units Oral Daily  . ciprofloxacin  200 mg Intravenous Q12H  . enoxaparin  40 mg Subcutaneous Q24H  . fluticasone  2 spray Each Nare Daily  . levothyroxine  100 mcg Oral QAC breakfast  . loratadine  10 mg Oral Daily  . montelukast  10 mg Oral QHS  . pantoprazole  40 mg Oral Q1200  .  potassium chloride  20 mEq Oral Daily  . sertraline  100 mg Oral Daily  . sodium chloride  3 mL Intravenous Q12H  . tiotropium  18 mcg Inhalation Daily  . DISCONTD: enoxaparin  30 mg Subcutaneous Q24H   Continuous Infusions:   . 0.9 % NaCl with KCl 20 mEq / L 40 mL/hr at 04/28/11 0700   PRN Meds:.acetaminophen, albuterol, albuterol, ipratropium   Assessment/Plan:  Principal Problem:  *Urosepsis-improving. Add probiotic. Continue current antibiotic. Active Problems:  OBSTRUCTIVE SLEEP APNEA-start her home cpap tonight.  ASTHMA-stable.  COPD-seems stable.  Dehydration-better  Hypotension-better.  Pyelonephritis-on  treatment.  Azotemia-improved.  Leukocytosis-improving.   LOS: 3 days   Ezequiel Kayser, MD 04/28/2011, 9:37 AM

## 2011-04-29 DIAGNOSIS — B379 Candidiasis, unspecified: Secondary | ICD-10-CM | POA: Diagnosis not present

## 2011-04-29 LAB — COMPREHENSIVE METABOLIC PANEL
ALT: 21 U/L (ref 0–35)
AST: 22 U/L (ref 0–37)
Albumin: 2.8 g/dL — ABNORMAL LOW (ref 3.5–5.2)
CO2: 22 mEq/L (ref 19–32)
Chloride: 104 mEq/L (ref 96–112)
GFR calc non Af Amer: 80 mL/min — ABNORMAL LOW (ref >90)
Potassium: 3.5 mEq/L (ref 3.5–5.1)
Sodium: 138 mEq/L (ref 135–145)
Total Bilirubin: 0.4 mg/dL (ref 0.3–1.2)

## 2011-04-29 LAB — CBC
Platelets: 164 10*3/uL (ref 150–400)
RBC: 4.18 MIL/uL (ref 3.87–5.11)
RDW: 13.8 % (ref 11.5–15.5)
WBC: 13.6 10*3/uL — ABNORMAL HIGH (ref 4.0–10.5)

## 2011-04-29 MED ORDER — NYSTATIN 100000 UNIT/ML MT SUSP
5.0000 mL | Freq: Four times a day (QID) | OROMUCOSAL | Status: DC
  Administered 2011-04-29 – 2011-05-02 (×13): 500000 [IU] via ORAL
  Filled 2011-04-29 (×17): qty 5

## 2011-04-29 MED ORDER — NYSTATIN 100000 UNIT/GM EX POWD
Freq: Three times a day (TID) | CUTANEOUS | Status: DC
  Administered 2011-04-29 – 2011-05-02 (×9): via TOPICAL
  Filled 2011-04-29: qty 15

## 2011-04-29 NOTE — Progress Notes (Addendum)
Physical Therapy Treatment Patient Details Name: Nancy Cole MRN: [id number] DOB: 1934/11/07 Today's Date: 04/29/2011 Time: 1330-1350 PT Time Calculation (min): 20 min  PT Assessment / Plan / Recommendation Comments on Treatment Session  pt progressing very well with gait distance    Follow Up Recommendations  Skilled nursing facility vs. HHPT   Equipment Recommendations  Defer to next venue 3 in 1 if pt DCs to home   Frequency Min 3X/week   Plan Discharge plan remains appropriate    Precautions / Restrictions Precautions Precautions: Fall   Pertinent Vitals/Pain *no c/o pain**    Mobility  Transfers Sit to Stand: 5: Supervision;With upper extremity assist;From chair/3-in-1 Stand to Sit: To chair/3-in-1;5: Supervision;Without upper extremity assist Details for Transfer Assistance: VCs for hand placement Ambulation/Gait Ambulation/Gait Assistance: 5: Supervision Ambulation Distance (Feet): 300 Feet Assistive device: Rolling walker Ambulation/Gait Assistance Details: some increased SOB with ambulation but SaO2 94% on RA while walking, gait safe and steady Gait Pattern: Within Functional Limits    Exercises     PT Goals Acute Rehab PT Goals PT Goal Formulation: With patient Time For Goal Achievement: 05/06/11 Potential to Achieve Goals: Good Pt will go Supine/Side to Sit: with supervision Pt will go Sit to Supine/Side: with supervision Pt will go Sit to Stand: with supervision PT Goal: Sit to Stand - Progress: Met Pt will Ambulate: >150 feet;with supervision;with least restrictive assistive device PT Goal: Ambulate - Progress: Met Pt will Perform Home Exercise Program: with supervision, verbal cues required/provided  Visit Information  Last PT Received On: 04/29/11 Assistance Needed: +1    Subjective Data  Subjective: This is the first day I don't feel lightheaded.  Patient Stated Goal: to go home   Cognition  Overall Cognitive Status: Appears within  functional limits for tasks assessed/performed Arousal/Alertness: Awake/alert Orientation Level: Oriented X4 / Intact Behavior During Session: [facility] for tasks performed    Balance     End of Session PT - End of Session Equipment Utilized During Treatment: Gait belt Activity Tolerance: Patient tolerated treatment well Patient left: in chair;with call bell/phone within reach;with family/visitor present Nurse Communication: Mobility status    Nancy Cole 04/29/2011, 2:04 PM [phone]

## 2011-04-29 NOTE — Progress Notes (Signed)
Subjective: Feels a bit better but complains of yeast in mouth and  On bottom.  Still doesn't feel good either.   Objective: Vital signs in last 24 hours: Temp:  [97.8 F (36.6 C)-98.6 F (37 C)] 97.8 F (36.6 C) (04/21 0636) Pulse Rate:  [67-82] 68  (04/21 0636) Resp:  [18-20] 20  (04/21 0636) BP: (128-152)/(82-95) 152/93 mmHg (04/21 0636) SpO2:  [94 %-96 %] 96 % (04/21 0636) Weight:  [75.1 kg (165 lb 9.1 oz)] 75.1 kg (165 lb 9.1 oz) (04/21 0636) Weight change: 0.2 kg (7.1 oz) Last BM Date: 04/28/11  Intake/Output from previous day: 04/20 0701 - 04/21 0700 In: 480 [I.V.:480] Out: 1300 [Urine:1300] Intake/Output this shift:    General appearance: alert, cooperative and appears stated age Resp: clear to auscultation bilaterally Cardio: regular rate and rhythm, S1, S2 normal, no murmur, click, rub or gallop GI: soft, non-tender; bowel sounds normal; no masses,  no organomegaly Extremities: extremities normal, atraumatic, no cyanosis or edema Neurologic: Grossly normal   Lab Results:  Basename 04/29/11 0428 04/28/11 0439  WBC 13.6* 21.0*  HGB 12.1 11.3*  HCT 36.8 34.6*  PLT 164 177   BMET  Basename 04/29/11 0428 04/28/11 0439  NA 138 136  K 3.5 4.1  CL 104 108  CO2 22 20  GLUCOSE 84 78  BUN 15 19  CREATININE 0.76 0.84  CALCIUM 9.1 8.7   CMET CMP     Component Value Date/Time   NA 138 04/29/2011 0428   K 3.5 04/29/2011 0428   CL 104 04/29/2011 0428   CO2 22 04/29/2011 0428   GLUCOSE 84 04/29/2011 0428   BUN 15 04/29/2011 0428   CREATININE 0.76 04/29/2011 0428   CALCIUM 9.1 04/29/2011 0428   PROT 6.0 04/29/2011 0428   ALBUMIN 2.8* 04/29/2011 0428   AST 22 04/29/2011 0428   ALT 21 04/29/2011 0428   ALKPHOS 93 04/29/2011 0428   BILITOT 0.4 04/29/2011 0428   GFRNONAA 80* 04/29/2011 0428   GFRAA >90 04/29/2011 0428     Studies/Results: No results found.  Medications: I have reviewed the patient's current medications.  Scheduled Meds:   . antiseptic oral rinse   15 mL Mouth Rinse BID  . cholecalciferol  2,000 Units Oral Daily  . ciprofloxacin  200 mg Intravenous Q12H  . ciprofloxacin  500 mg Oral BID  . enoxaparin  40 mg Subcutaneous Q24H  . Flora-Q  1 capsule Oral Daily  . fluticasone  2 spray Each Nare Daily  . levothyroxine  100 mcg Oral QAC breakfast  . loratadine  10 mg Oral Daily  . montelukast  10 mg Oral QHS  . nystatin  5 mL Oral QID  . nystatin   Topical TID  . pantoprazole  40 mg Oral Q1200  . potassium chloride  20 mEq Oral Daily  . sertraline  100 mg Oral Daily  . sodium chloride  3 mL Intravenous Q12H  . tiotropium  18 mcg Inhalation Daily   Continuous Infusions:   . 0.9 % NaCl with KCl 20 mEq / L 40 mL/hr at 04/28/11 1226  . DISCONTD: 0.9 % NaCl with KCl 20 mEq / L 40 mL/hr at 04/28/11 0700   PRN Meds:.acetaminophen, albuterol, albuterol, ipratropium   Assessment/Plan:  Principal Problem:  *Urosepsis-improving. Plan home tomorrow. Active Problems:  OBSTRUCTIVE SLEEP APNEA-follow  ASTHMA-stable.  COPD-stable.  Dehydration-improved.  Hypotension-improved.  Pyelonephritis-on treatment.  Azotemia-better.  Leukocytosis-continues to improve Yeast in mouth and perineum . Treat this.   LOS:  4 days   Ezequiel Kayser, MD 04/29/2011, 8:33 AM

## 2011-04-30 LAB — COMPREHENSIVE METABOLIC PANEL
AST: 20 U/L (ref 0–37)
Albumin: 3 g/dL — ABNORMAL LOW (ref 3.5–5.2)
Alkaline Phosphatase: 74 U/L (ref 39–117)
Chloride: 101 mEq/L (ref 96–112)
Potassium: 3 mEq/L — ABNORMAL LOW (ref 3.5–5.1)
Total Bilirubin: 0.4 mg/dL (ref 0.3–1.2)

## 2011-04-30 LAB — CBC
Platelets: 182 10*3/uL (ref 150–400)
RDW: 13.6 % (ref 11.5–15.5)
WBC: 8 10*3/uL (ref 4.0–10.5)

## 2011-04-30 MED ORDER — VALACYCLOVIR HCL 500 MG PO TABS
1000.0000 mg | ORAL_TABLET | Freq: Two times a day (BID) | ORAL | Status: AC
  Administered 2011-04-30 (×2): 1000 mg via ORAL
  Filled 2011-04-30 (×2): qty 2

## 2011-04-30 MED ORDER — FLUCONAZOLE 200 MG PO TABS
200.0000 mg | ORAL_TABLET | Freq: Once | ORAL | Status: AC
  Administered 2011-04-30: 200 mg via ORAL
  Filled 2011-04-30: qty 1

## 2011-04-30 MED ORDER — POTASSIUM CHLORIDE CRYS ER 20 MEQ PO TBCR
20.0000 meq | EXTENDED_RELEASE_TABLET | Freq: Once | ORAL | Status: AC
  Administered 2011-04-30: 20 meq via ORAL
  Filled 2011-04-30: qty 1

## 2011-04-30 NOTE — Progress Notes (Signed)
Subjective: Admitted with Pyleonephritis/Urosepsis/Leukocytosis/Azotemia/DeHydration/Hypokalemia/Falls  S/p IV Cipro, IV Vanco x one, and IVF.  Feeling much better, but Mouth, Vagina and anus are bothering her.  Her lips are inflamed and blistered.  She has Thrush Diarrhea controlled.  C dif (-)  Still weak but improving.   Objective: Vital signs in last 24 hours: Temp:  [98.3 F (36.8 C)-98.6 F (37 C)] 98.6 F (37 C) (04/22 1610) Pulse Rate:  [82-112] 82  (04/22 0623) Resp:  [15-18] 18  (04/22 0623) BP: (122-137)/(80-86) 122/80 mmHg (04/22 0623) SpO2:  [94 %-95 %] 94 % (04/22 0623) FiO2 (%):  [21 %] 21 % (04/21 1443) Weight:  [72.7 kg (160 lb 4.4 oz)] 72.7 kg (160 lb 4.4 oz) (04/22 0623) Weight change: -2.4 kg (-5 lb 4.7 oz) Last BM Date: 04/29/11  CBG (last 3)  No results found for this basename: GLUCAP:3 in the last 72 hours  Intake/Output from previous day:  Intake/Output Summary (Last 24 hours) at 04/30/11 0712 Last data filed at 04/30/11 0500  Gross per 24 hour  Intake      0 ml  Output   1400 ml  Net  -1400 ml   04/21 0701 - 04/22 0700 In: -  Out: 1400 [Urine:1400]   Physical Exam General appearance: In Bed Mouth - lips swollen and blister Resp: Clear   Cardio: reg  GI: soft, non-tender; bowel sounds normal; no masses, no organomegaly  R Flank better Extremities: extremities normal, atraumatic, no cyanosis or edema  Pulses: 2+ and symmetric  Lymph nodes: no cervical lymphadenopathy  Neurologic: Alert and oriented X 3, normal strength and tone. Normal symmetric reflexes.     Lab Results:  [facility] 04/30/11 0449 04/29/11 0428  NA 136 138  K 3.0* 3.5  CL 101 104  CO2 22 22  GLUCOSE 97 84  BUN 12 15  CREATININE 0.70 0.76  CALCIUM 8.8 9.1  MG -- --  PHOS -- --     Basename 04/30/11 0449 04/29/11 0428  AST 20 22  ALT 17 21  ALKPHOS 74 93  BILITOT 0.4 0.4  PROT 6.1 6.0  ALBUMIN 3.0* 2.8*     Basename 04/30/11 0449 04/29/11 0428  WBC  8.0 13.6*  NEUTROABS -- --  HGB 13.1 12.1  HCT 38.0 36.8  MCV 85.6 88.0  PLT 182 164    No results found for this basename: INR,  PROTIME    No results found for this basename: CKTOTAL:3,CKMB:3,CKMBINDEX:3,TROPONINI:3 in the last 72 hours  No results found for this basename: TSH,T4TOTAL,FREET3,T3FREE,THYROIDAB in the last 72 hours  No results found for this basename: VITAMINB12:2,FOLATE:2,FERRITIN:2,TIBC:2,IRON:2,RETICCTPCT:2 in the last 72 hours  Micro Results: Recent Results (from the past 240 hour(s))  URINE CULTURE     Status: Normal   Collection Time   04/25/11  2:33 PM      Component Value Range Status Comment   Specimen Description URINE, CATHETERIZED   Final    Special Requests NONE   Final    Culture  Setup Time [id number]   Final    Colony Count >=100,000 COLONIES/ML   Final    Culture ESCHERICHIA COLI   Final    Report Status 04/27/2011 FINAL   Final    Organism ID, Bacteria ESCHERICHIA COLI   Final   CULTURE, BLOOD (ROUTINE X 2)     Status: Normal (Preliminary result)   Collection Time   04/25/11  6:51 PM      Component Value Range Status Comment   Specimen  Description BLOOD LEFT HAND   Final    Special Requests BOTTLES DRAWN AEROBIC AND ANAEROBIC   Final    Culture  Setup Time [id number]   Final    Culture     Final    Value:        BLOOD CULTURE RECEIVED NO GROWTH TO DATE CULTURE WILL BE HELD FOR 5 DAYS BEFORE ISSUING A FINAL NEGATIVE REPORT   Report Status PENDING   Incomplete   CULTURE, BLOOD (ROUTINE X 2)     Status: Normal (Preliminary result)   Collection Time   04/25/11  8:07 PM      Component Value Range Status Comment   Specimen Description BLOOD RIGHT ARM   Final    Special Requests BOTTLES DRAWN AEROBIC AND ANAEROBIC 5CC   Final    Culture  Setup Time [id number]   Final    Culture     Final    Value:        BLOOD CULTURE RECEIVED NO GROWTH TO DATE CULTURE WILL BE HELD FOR 5 DAYS BEFORE ISSUING A FINAL NEGATIVE REPORT   Report Status  PENDING   Incomplete   CLOSTRIDIUM DIFFICILE BY PCR     Status: Normal   Collection Time   04/26/11  9:15 AM      Component Value Range Status Comment   C difficile by pcr NEGATIVE  NEGATIVE  Final      Studies/Results: No results found.   Medications: Scheduled:    . antiseptic oral rinse  15 mL Mouth Rinse BID  . cholecalciferol  2,000 Units Oral Daily  . ciprofloxacin  500 mg Oral BID  . enoxaparin  40 mg Subcutaneous Q24H  . Flora-Q  1 capsule Oral Daily  . fluticasone  2 spray Each Nare Daily  . levothyroxine  100 mcg Oral QAC breakfast  . loratadine  10 mg Oral Daily  . montelukast  10 mg Oral QHS  . nystatin  5 mL Oral QID  . nystatin   Topical TID  . pantoprazole  40 mg Oral Q1200  . potassium chloride  20 mEq Oral Daily  . sertraline  100 mg Oral Daily  . sodium chloride  3 mL Intravenous Q12H  . tiotropium  18 mcg Inhalation Daily   Continuous:     Assessment/Plan: Principal Problem:  *Urosepsis Active Problems:  OBSTRUCTIVE SLEEP APNEA  ASTHMA  COPD  Dehydration  Hypotension  Pyelonephritis  Azotemia  Leukocytosis  Candidiasis  Problem #1: Pyleonephritis/Urosepsis/Leukocytosis/Azotemia/DeHydration/Hypokalemia/Falls/Rhabdomyolysis cx = E coli  Finish 14 days Cipro - Now on oral - off IVF. Cxs are pan Sensitive Finish tele.  Rhabdo + with CK 855. Myoglobin 1462 resolved. Replace K Cr down to 0.7 WBC down to 8 [place] o/w (-).  Hbg 13.1  Continue Home meds where appropriate.   Problem # 2: ASTHMA (ICD-493.90)/COPD  Formerly steroid dependent Dr Maple Hudson [phone].  She is not acting adrenally insufficient am cortisol fine (-) Alpha 1 AntiTrypsin  PFTs 01/12/10 just prior to surgery - FEV1/FVC 0.48  Continue O2, Nebs, HFAs and IS  Problem # 3: HYPOTHYROIDISM (ICD-244.9) -Continue Synthroid.  Problem # 4: OBSTRUCTIVE SLEEP APNEA (ICD-327.23)  OSA supposed to be on BiPAP/CPAP = poorly compliant Has been restarted  Problem # 5: DIARRHEA  (ICD-787.91) - currently not much of an issue.  C Dif (-) - No isolation needed.  Problem # 6: DEPRESSION, MILD (ICD-311)  continue Zoloft 100 Mg Tabs (Sertraline hcl) .Marland Kitchen... 1 po qd   Problem #  7: BACK PAIN, LUMBAR (ICD-724.2) - S/P ESI, uses pain meds at home.  Problem # 8: GERD (ICD-530.81) PPI   Problem 9 - DVT proph.   Problem #10 - CVAs on CCT - will work on RF reduction when she is better. May need MRI.  Problem 11- Yeast in mouth and perineum . Continue to Treat this and add diflucan.   Potential D/C Tomorrow - She and I are leaning towards SNF for Rehab.  Hopefully a bed will be avail. FL-2 signed   ID -  Anti-infectives     Start     Dose/Rate Route Frequency Ordered Stop   04/29/11 0800   ciprofloxacin (CIPRO) tablet 500 mg        500 mg Oral 2 times daily 04/28/11 1128 05/09/11 0759   04/26/11 0800   vancomycin (VANCOCIN) IVPB 1000 mg/200 mL premix        1,000 mg 200 mL/hr over 60 Minutes Intravenous  Once 04/26/11 0729 04/26/11 0912   04/25/11 2200   ciprofloxacin (CIPRO) IVPB 200 mg        200 mg 100 mL/hr over 60 Minutes Intravenous Every 12 hours 04/25/11 1940 04/28/11 2219   04/25/11 1630   ciprofloxacin (CIPRO) IVPB 200 mg        200 mg 100 mL/hr over 60 Minutes Intravenous  Once 04/25/11 1615 04/25/11 1746          LOS: 5 days   Abhay Godbolt M 04/30/2011, 7:12 AM

## 2011-04-30 NOTE — Progress Notes (Signed)
Physical Therapy Treatment Patient Details Name: Nancy Cole MRN: [id number] DOB: 20-Nov-1934 Today's Date: 04/30/2011 Time: 1478-2956 PT Time Calculation (min): 40 min  PT Assessment / Plan / Recommendation Comments on Treatment Session  Patient improving with acitivity tolerance.  Still with imbalance without UE assist.  Feel STSNF setting appropriate to return to independent prior to d/c home.    Follow Up Recommendations  Skilled nursing facility    Equipment Recommendations  Defer to next venue    Frequency Min 3X/week   Plan Discharge plan remains appropriate    Precautions / Restrictions Precautions Precautions: Fall   Pertinent Vitals/Pain Min complaint of generalized soreness in muscles 2-3/10; HR 115 SpO2 92% with ambulation    Mobility  Bed Mobility Supine to Sit: HOB elevated;5: Supervision Sit to Supine: 5: Supervision Details for Bed Mobility Assistance: for safety Transfers Sit to Stand: 5: Supervision;With upper extremity assist;From bed Stand to Sit: To bed;5: Supervision;With upper extremity assist Ambulation/Gait Ambulation/Gait Assistance: 5: Supervision Ambulation Distance (Feet): 300 Feet Assistive device: Rolling walker Ambulation/Gait Assistance Details: leaning onto UE's for support, right foot crossing over occasionally.  Educated in proper posture and upright standing inside walker with ambulation.    Exercises Other Exercises Other Exercises: Performed sit to/from stand with UE assist x 5 reps   PT Goals Acute Rehab PT Goals Time For Goal Achievement: 05/06/11 Potential to Achieve Goals: Good Pt will go Supine/Side to Sit: with supervision PT Goal: Supine/Side to Sit - Progress: Met Pt will go Sit to Supine/Side: with supervision PT Goal: Sit to Supine/Side - Progress: Met Pt will go Sit to Stand: with supervision PT Goal: Sit to Stand - Progress: Met Pt will Ambulate: >150 feet;with supervision;with least restrictive assistive  device PT Goal: Ambulate - Progress: Progressing toward goal Pt will Perform Home Exercise Program: with supervision, verbal cues required/provided PT Goal: Perform Home Exercise Program - Progress: Progressing toward goal  Visit Information  Last PT Received On: 04/30/11 Assistance Needed: +1    Subjective Data  Subjective: Feel like I should go to rehab even though I want to go home.  Just to be on the safe side.   Cognition  Overall Cognitive Status: Appears within functional limits for tasks assessed/performed Arousal/Alertness: Awake/alert Behavior During Session: [facility] for tasks performed    Balance  High Level Balance High Level Balance Activites: Side stepping;Other (comment) (marching in place, step taps forward over tile transition) High Level Balance Comments: with one UE assist  End of Session PT - End of Session Equipment Utilized During Treatment: Gait belt Activity Tolerance: Patient tolerated treatment well Patient left: with call bell/phone within reach;with bed alarm set;in bed    Adabelle Griffiths,CYNDI 04/30/2011, 5:11 PM

## 2011-04-30 NOTE — Progress Notes (Signed)
   CARE MANAGEMENT NOTE 04/30/2011  Patient:  Nancy Cole, Nancy Cole   Account Number:  [id number]  Date Initiated:  04/26/2011  Documentation initiated by:  Lanier Clam  Subjective/Objective Assessment:   ADMITTED W/FALL,HYPOTENSION     Action/Plan:   FROM HOME,ALONE.   Anticipated DC Date:  05/01/2011   Anticipated DC Plan:  SKILLED NURSING FACILITY  In-house referral  Clinical Social Worker         Choice offered to / List presented to:             Status of service:  In process, will continue to follow Medicare Important Message given?   (If response is "NO", the following Medicare IM given date fields will be blank) Date Medicare IM given:   Date Additional Medicare IM given:    Discharge Disposition:    Per UR Regulation:  Reviewed for med. necessity/level of care/duration of stay  If discussed at Long Length of Stay Meetings, dates discussed:    Comments:  04/30/11 Shailen Thielen RN,BSN NCM [phone] PATIENT AGREE SNF.CSW FOLLOWING.LIKELY D/C AM IF MED STABLE PER MD.  04/26/11 Dedric Ethington RN,BSN NCM [phone] PT-SNF VS HH. PATIENT AGREE SNF. CSW NOTIFIED.

## 2011-04-30 NOTE — Progress Notes (Signed)
CSW met with patient. Patient is requesting [facility] for SNF. CSW to follow for discharge.  Delon Revelo C. Shakima Nisley MSW, LCSW [phone]

## 2011-05-01 ENCOUNTER — Ambulatory Visit: Payer: Medicare Other | Admitting: Internal Medicine

## 2011-05-01 LAB — CBC
HCT: 36.9 % (ref 36.0–46.0)
MCH: 29.3 pg (ref 26.0–34.0)
MCV: 87.2 fL (ref 78.0–100.0)
Platelets: 222 10*3/uL (ref 150–400)
RBC: 4.23 MIL/uL (ref 3.87–5.11)
RDW: 13.9 % (ref 11.5–15.5)

## 2011-05-01 LAB — COMPREHENSIVE METABOLIC PANEL
AST: 18 U/L (ref 0–37)
BUN: 15 mg/dL (ref 6–23)
CO2: 24 mEq/L (ref 19–32)
Calcium: 8.7 mg/dL (ref 8.4–10.5)
Creatinine, Ser: 0.76 mg/dL (ref 0.50–1.10)
GFR calc non Af Amer: 80 mL/min — ABNORMAL LOW (ref >90)

## 2011-05-01 MED ORDER — CIPROFLOXACIN HCL 500 MG PO TABS: 500.0000 mg | ORAL_TABLET | Freq: Two times a day (BID) | ORAL | Status: AC

## 2011-05-01 MED ORDER — BIOTENE DRY MOUTH MT LIQD: 15.0000 mL | Freq: Two times a day (BID) | OROMUCOSAL | Status: DC

## 2011-05-01 MED ORDER — ALBUTEROL SULFATE (5 MG/ML) 0.5% IN NEBU: 2.5000 mg | INHALATION_SOLUTION | Freq: Four times a day (QID) | RESPIRATORY_TRACT | Status: DC | PRN

## 2011-05-01 MED ORDER — NYSTATIN 100000 UNIT/GM EX POWD: 1.0000 g | Freq: Three times a day (TID) | CUTANEOUS | Status: DC

## 2011-05-01 MED ORDER — FLUTICASONE PROPIONATE 50 MCG/ACT NA SUSP: 2.0000 | Freq: Every day | NASAL | Status: DC

## 2011-05-01 MED ORDER — ACETAMINOPHEN 325 MG PO TABS: 650.0000 mg | ORAL_TABLET | Freq: Four times a day (QID) | ORAL | Status: DC | PRN

## 2011-05-01 MED ORDER — NYSTATIN 100000 UNIT/ML MT SUSP: 5.0000 mL | Freq: Four times a day (QID) | OROMUCOSAL | Status: AC

## 2011-05-01 MED ORDER — FLORA-Q PO CAPS: 1.0000 | ORAL_CAPSULE | Freq: Every day | ORAL | Status: AC

## 2011-05-01 NOTE — Discharge Summary (Signed)
Physician Discharge Summary  DISCHARGE SUMMARY   Patient ID: RYLIE LIMBURG MR#: [id number] DOB/AGE: January 25, 1934 77 y.o.   Attending Physician:Rhaelyn Giron M  Patient's NWG:NFAOZ,HYQM M, MD, MD  Consults: none  Admit date: 04/25/2011 Discharge date: 05/01/2011  Discharge Diagnoses:  Principal Problem:  *Urosepsis Active Problems:  OBSTRUCTIVE SLEEP APNEA  ASTHMA  COPD  Dehydration  Hypotension  Pyelonephritis  Azotemia  Leukocytosis  Candidiasis   Patient Active Problem List  Diagnoses  . THRUSH  . ADENOCARCINOMA, COLON  . OBSTRUCTIVE SLEEP APNEA  . ASTHMA  . COPD  . OSTEOARTHRITIS  . DYSPNEA  . DIARRHEA  . Rhinitis  . Dehydration  . Hypotension  . Pyelonephritis  . Urosepsis  . Azotemia  . Leukocytosis  . Candidiasis   Past Medical History  Diagnosis Date  . COPD (chronic obstructive pulmonary disease)     PFT 2007 FEV1/FVC 43%  PFT 01/12/10 FEV1 1.0/51%, FVC 2.0/79%, FEV1/FVC 0.48  . Osteoarthritis   . DDD (degenerative disc disease)   . OSA on CPAP   . Seasonal allergies   . Asthma   . Diverticulitis     Discharged Condition: good   Discharge Medications: Medication List  As of 05/01/2011  6:37 AM   STOP taking these medications         HYDROcodone-homatropine 5-1.5 MG/5ML syrup      omeprazole 20 MG tablet         TAKE these medications         acetaminophen 325 MG tablet   Commonly known as: TYLENOL   Take 2 tablets (650 mg total) by mouth every 6 (six) hours as needed for pain or fever.      PROAIR HFA 108 (90 BASE) MCG/ACT inhaler   Generic drug: albuterol   Inhale 2 puffs into the lungs every 6 (six) hours as needed. Wheezing/shortness of breath.      albuterol (5 MG/ML) 0.5% nebulizer solution   Commonly known as: PROVENTIL   Take 0.5 mLs (2.5 mg total) by nebulization every 6 (six) hours as needed for wheezing or shortness of breath.      antiseptic oral rinse Liqd   15 mLs by Mouth Rinse route 2 (two) times daily.     cetirizine 10 MG tablet   Commonly known as: ZYRTEC   Take 10 mg by mouth daily.      ciprofloxacin 500 MG tablet   Commonly known as: CIPRO   Take 1 tablet (500 mg total) by mouth 2 (two) times daily.      cromolyn 5.2 MG/ACT nasal spray   Commonly known as: NASALCROM   1 spray by Nasal route 3 (three) times daily.      Flora-Q Caps   Take 1 capsule by mouth daily.      fluticasone 50 MCG/ACT nasal spray   Commonly known as: FLONASE   Place 2 sprays into the nose daily.      levothyroxine 100 MCG tablet   Commonly known as: SYNTHROID, LEVOTHROID   Take 100 mcg by mouth daily.      montelukast 10 MG tablet   Commonly known as: SINGULAIR   Take 1 tablet (10 mg total) by mouth at bedtime.      nystatin 100000 UNIT/GM Powd   Apply 1 g (100,000 Units total) topically 3 (three) times daily. Prn to affected areas.      nystatin 100000 UNIT/ML suspension   Commonly known as: MYCOSTATIN   Take 5 mLs (500,000 Units total) by mouth  4 (four) times daily. Take until resolved      omeprazole 20 MG capsule   Commonly known as: PRILOSEC   Take 20 mg by mouth daily.      potassium chloride 10 MEQ tablet   Commonly known as: K-DUR,KLOR-CON   2 tabs twice daily      sertraline 100 MG tablet   Commonly known as: ZOLOFT   Take 100 mg by mouth daily.      tiotropium 18 MCG inhalation capsule   Commonly known as: SPIRIVA   Place 18 mcg into inhaler and inhale daily.      Vitamin D 2000 UNITS Caps   Take 1 capsule by mouth daily.            Hospital Procedures: Dg Chest 1 View  04/26/2011  *RADIOLOGY REPORT*  Clinical Data: Shortness of breath  CHEST - 1 VIEW  Comparison: 04/25/2011  Findings: Cardiomediastinal silhouette is stable.  No pulmonary edema.  Persistent streaky bilateral basilar atelectasis or infiltrate.  IMPRESSION: No pulmonary edema.  Persistent streaky bilateral basilar atelectasis or infiltrate.  Original Report Authenticated By: Natasha Mead, M.D.   Ct Head Wo  Contrast  04/25/2011  *RADIOLOGY REPORT*  Clinical Data: Nausea and emesis.  Fall.  CT HEAD WITHOUT CONTRAST  Technique:  Contiguous axial images were obtained from the base of the skull through the vertex without contrast.  Comparison: None.  Findings: Lacunar infarcts of the cerebellum bilaterally are age indeterminate.  A larger area of hypoattenuation in the inferior left cerebellum also likely represents ischemia, age indeterminate. There is slight prominence of the extra-axial CSF within the left posterior fossa, likely representing an arachnoid cyst. A focal calcification within the left temporal lobe measures 6 mm on image 14.  Moderate periventricular and diffuse confluent subcortical white matter hypoattenuation is present bilaterally.  No acute supratentorial cortical infarct, hemorrhage, or mass lesion is present.  The ventricles are proportionate to the degree of atrophy.  No significant extra-axial fluid collection is present.  The paranasal sinuses and mastoid air cells are clear.  The osseous skull is intact.  IMPRESSION:  1.  Age indeterminate lacunar infarcts of the cerebellum bilaterally. 2.  Age indeterminate left inferior cerebellar infarct, measuring up to 2.5 cm. 3.  Atrophy and extensive white matter disease within the supratentorial brain.  This likely reflects the sequelae of chronic microvascular ischemia. 4.  Focal calcification within the left temporal lobe.  This may be related to prior infection or contusion.  Neoplasm is considered less likely.  MRI without and with contrast would be useful for further evaluation as clinically indicated.  Original Report Authenticated By: Jamesetta Orleans. MATTERN, M.D.   US Renal  04/26/2011  *RADIOLOGY REPORT*  Clinical Data: Renal failure, dehydration.  RENAL/URINARY TRACT ULTRASOUND COMPLETE  Comparison:  None.  Findings:  Right Kidney:  11.1 cm with normal parenchymal echogenicity.  No hydronephrosis.  Left Kidney:  10.9 cm with normal  parenchymal echogenicity.  There is mild renal cortical thinning.  No hydronephrosis.  Bladder:  Normal.  IMPRESSION: No acute findings.  Possible left renal cortical thinning.  Original Report Authenticated By: Reyes Ivan, M.D.   Dg Chest Port 1 View  04/25/2011  *RADIOLOGY REPORT*  Clinical Data: Shortness of breath and cough.  PORTABLE CHEST - 1 VIEW  Comparison: None.  Findings: Streaky bibasilar airspace opacities are most consistent with subsegmental atelectasis.  Lungs otherwise appear clear. Heart size is normal.  No pneumothorax or pleural fluid.  IMPRESSION: Streaky  bibasilar airspace opacity most consistent with subsegmental atelectasis.  Original Report Authenticated By: Bernadene Bell. Maricela Curet, M.D.    History of Present Illness: 32 F with Asthma/COPD, HTN, hypothyroidism, OSA on BPAP/CPAP, h/o diverticulitis S/P sig colectomy and L Mucinous Cystadenoma removal (02/18/07), Chronic Diarrhea per Dr Dulce Sellar, Ventral Hernia, AVMs, osteopenia, bilateral cataracts, GERD, allergic rhinitis, low back pain/OA, & Ventral Hernia who presents to ED after being found down. She would not come to phone or door for family so they came into her house and found her down. EMS called and came out. She was Hypotensive 70's/30's, Dehydrated. She claimed N/V/D for 2-3 days prior. She was given IVFs. B/c she hit her head she had a CCT with old CVA's - nothing acute. Labs showed a significant Leukocytosis =36.5K and Azotemia with Bun/Cr 24/2.1 and lactate 3. C dif ordered. She has a UTI. CXR ATX. Some low sats - given a Neb. Tachy 110-120 with mild wheeze noted by ED doc. I was called for inpt admit   Hospital Course: Admitted with Pyleonephritis/Urosepsis/Leukocytosis/Azotemia/DeHydration/Hypokalemia/Falls  S/p IV then PO Cipro, IV Vanco x one, and IVF.  Improved nicely with above treatment. Only issue that arose during hospitalization was candidiasis and Herpetic lip lesions. Diarrhea controlled. C dif (-)    Still weak but improving.   Problem # 1: Pyleonephritis/Urosepsis/Leukocytosis/Azotemia/DeHydration/Hypokalemia/Falls Hypotension and Dehydration improved with IVF.  Her Cortisol was High.  S/p IV then PO Cipro and IVF. Finish 14 days total ABx  Her U Cx was Pan Sensitive E Coli. She is S/P Rhabdo. Peak CK 855. Myoglobin 1462 = resolved.  Currently improved She had recurrent Hypokalemia and had to have her K replaced. Kidney bladder US was unremarkable..o/w (-). Cr down to 0.7  WBC down to 8  Hbg 13.1   Problem # 2: ASTHMA (ICD-493.90)  asthma/COPD--Formerly steroid dependent Dr Maple Hudson [phone]. She is not acting adrenally insufficient am cortisol fine. (-) Alpha 1 AntiTrypsin  PFTs 01/12/10 just prior to surgery - FEV1/FVC 0.48 We kept her on pulm toilet, we weaned her off O2.  Problem # 3: HYPERTENSION (ICD-401.9)  Off all meds. Monitor.  Problem # 4: HYPOTHYROIDISM (ICD-244.9)  on meds and doing well = Synthroid 100 Mcg Tabs (Levothyroxine sodium) .Marland Kitchen... 1 tab daily   Problem # 5: OBSTRUCTIVE SLEEP APNEA (ICD-327.23)  OSA CPAP 9 cm H20 AHI 17.9   Problem # 6: DIARRHEA (ICD-787.91) - currently not much of an issue. C Dif (-) - No isolation needed. Chronic Diarrhea per Dr Dulce Sellar  h/o diverticulitis S/P sig colectomy and L Mucinous Cystadenoma removal (02/18/07),  Had CT scan (-). Next Colonoscopy is next year at 38.   Problem # 7: DEPRESSION, MILD (ICD-311)  continue Zoloft 100 Mg Tabs (Sertraline hcl) .Marland Kitchen... 1 po qd   Problem # 8: BACK PAIN, LUMBAR (ICD-724.2)  Dr Alvester Morin has done Shots and nerve blocks. Dr Cleophas Dunker prn. She had SEs to Ultram and may need prn Vicodin.  Problem # 9: OSTEOPENIA (ICD-733.90)  H/O left fracture arm/osteopenia  Dexa 10/06 and Dr Nicholas Lose had been following.  r Fem neck (-) 1.6   Problem # 10: GERD (ICD-530.81)  GERD - stable on omeprazole.   Problem # 11: ALLERGIC RHINITIS (ICD-477.9)  allergic rhinitis. Seasonal and worse in summer.   Zyrtec Allergy 10 Mg Caps (Cetirizine hcl) .Marland Kitchen... 1 qhs   Problem 12 - DVT proph was provided throughout her hospitalization.  Problem #13 - CVAs on CCT - continue to work on RF reduction when she is  better. May need MRI - I will handkle as outpatient.  Problem # 14.  Lip Bl;isters - S/P one day Valtrex 1000 BID  Problem #15 Thrush and candidiasis - Nystatin and one dose Diflucan.  Problem #16 do to her issues she is left with waeakness and deconditioning - PT/OT/CW/SW saw pt and she will need 1-2 weeks rehab at SNF prior to returning home.  Potential D/C Today- Hopefully a bed will be avail.  FL-2 signed   Doing much better than admit.  Breathing well.  No new C/O.  Mouth is a little better.  Swallowing a little better.    Day of Discharge Exam BP 146/89  Pulse 88  Temp(Src) 98 F (36.7 C) (Oral)  Resp 18  Ht 5\' 1"  (1.549 m)  Wt 72.5 kg (159 lb 13.3 oz)  BMI 30.20 kg/m2  SpO2 95%  Physical Exam: General appearance: In Bed  Mouth - lips less swollen and blisters Resp: Clear  Cardio: reg  GI: soft, non-tender; bowel sounds normal; no masses, no organomegaly  R Flank better  Extremities: extremities normal, atraumatic, no cyanosis or edema  Pulses: 2+ and symmetric  Lymph nodes: no cervical lymphadenopathy  Neurologic: Alert and oriented X 3, normal strength and tone. Normal symmetric reflexes.   Discharge Labs:  [facility] 05/01/11 0510 04/30/11 0449  NA 135 136  K 3.2* 3.0*  CL 102 101  CO2 24 22  GLUCOSE 91 97  BUN 15 12  CREATININE 0.76 0.70  CALCIUM 8.7 8.8  MG -- --  PHOS -- --    Basename 05/01/11 0510 04/30/11 0449  AST 18 20  ALT 16 17  ALKPHOS 65 74  BILITOT 0.3 0.4  PROT 5.9* 6.1  ALBUMIN 2.9* 3.0*    Basename 05/01/11 0510 04/30/11 0449  WBC 10.6* 8.0  NEUTROABS -- --  HGB 12.4 13.1  HCT 36.9 38.0  MCV 87.2 85.6  PLT 222 182   No results found for this basename: CKTOTAL:3,CKMB:3,CKMBINDEX:3,TROPONINI:3 in the last 72 hours No results  found for this basename: TSH,T4TOTAL,FREET3,T3FREE,THYROIDAB in the last 72 hours No results found for this basename: VITAMINB12:2,FOLATE:2,FERRITIN:2,TIBC:2,IRON:2,RETICCTPCT:2 in the last 72 hours No results found for this basename: INR, PROTIME   Micro Results:  Recent Results (from the past 240 hour(s))   URINE CULTURE Status: Normal    Collection Time    04/25/11 2:33 PM   Component  Value  Range  Status  Comment    Specimen Description  URINE, CATHETERIZED   Final     Special Requests  NONE   Final     Culture Setup Time  [id number]   Final     Colony Count  >=100,000 COLONIES/ML   Final     Culture  ESCHERICHIA COLI   Final     Report Status  04/27/2011 FINAL   Final     Organism ID, Bacteria  ESCHERICHIA COLI   Final    CULTURE, BLOOD (ROUTINE X 2) Status: Normal (Preliminary result)    Collection Time    04/25/11 6:51 PM   Component  Value  Range  Status  Comment    Specimen Description  BLOOD LEFT HAND   Final     Special Requests  BOTTLES DRAWN AEROBIC AND ANAEROBIC   Final     Culture Setup Time  [id number]   Final     Culture    Final     Value:  BLOOD CULTURE RECEIVED NO GROWTH TO DATE CULTURE WILL BE HELD  FOR 5 DAYS BEFORE ISSUING A FINAL NEGATIVE REPORT    Report Status  PENDING   Incomplete    CULTURE, BLOOD (ROUTINE X 2) Status: Normal (Preliminary result)    Collection Time    04/25/11 8:07 PM   Component  Value  Range  Status  Comment    Specimen Description  BLOOD RIGHT ARM   Final     Special Requests  BOTTLES DRAWN AEROBIC AND ANAEROBIC 5CC   Final     Culture Setup Time  [id number]   Final     Culture    Final     Value:  BLOOD CULTURE RECEIVED NO GROWTH TO DATE CULTURE WILL BE HELD FOR 5 DAYS BEFORE ISSUING A FINAL NEGATIVE REPORT    Report Status  PENDING   Incomplete    CLOSTRIDIUM DIFFICILE BY PCR Status: Normal    Collection Time    04/26/11 9:15 AM   Component  Value  Range  Status  Comment    C difficile by pcr  NEGATIVE  NEGATIVE  Final            Discharge instructions:    Disposition: SNF  Follow-up Appts: Follow-up with Dr. Timothy Lasso at [facility] 1-2 weeks post D/C from SNF.  Call for appointment.  Condition on Discharge: Stable  Tests Needing Follow-up: BMET, CBC one week post D/C  Time spent in discharge (includes decision making & examination of pt): 35 min  Signed: Monseratt Ledin M 05/01/2011, 6:37 AM

## 2011-05-01 NOTE — Progress Notes (Addendum)
Patient cleared for discharge. Information sent to [employer] at 11AM. Patient accepted at [facility]. Pending auth.  Genesia Caslin C. Jeret Goyer MSW, LCSW [phone] At 4PM, CSW called [employer], Tiffani, She states that the medical director still has the case to review it. Will likely be tomorrow.  Richa Shor C. Micala Saltsman MSW, LCSW [phone]

## 2011-05-01 NOTE — Progress Notes (Signed)
Physical Therapy Treatment Patient Details Name: Nancy Cole MRN: [id number] DOB: 02/15/1934 Today's Date: 05/01/2011 Time: 9147-8295 PT Time Calculation (min): 14 min  PT Assessment / Plan / Recommendation Comments on Treatment Session  Pt continuing to perform well. Reports some fatigue today but agreeable to exercises. Ambulated in room. Plans to d/c to rehab today.    Follow Up Recommendations  Skilled nursing facility    Equipment Recommendations  Defer to next venue    Frequency     Plan Discharge plan remains appropriate    Precautions / Restrictions Precautions Precautions: Fall   Pertinent Vitals/Pain     Mobility  Transfers Transfers: Sit to Stand;Stand to Sit Sit to Stand: 5: Supervision Stand to Sit: 5: Supervision Ambulation/Gait Ambulation/Gait Assistance: 5: Supervision Ambulation Distance (Feet): 15 Feet (x2) Assistive device: Rolling walker Ambulation/Gait Assistance Details: VCs safety Gait Pattern: Within Functional Limits    Exercises General Exercises - Lower Extremity-holding onto RW with 2 hands. Supervision assist.  Hip Flexion/Marching: AROM;Standing (12 reps) Heel Raises: AROM;Standing (12 reps) Mini-Sqauts: AROM;Standing (12 reps) Other Exercises Other Exercises: Standign knee flexion 12 reps   PT Goals Acute Rehab PT Goals PT Goal: Sit to Stand - Progress: Progressing toward goal PT Goal: Ambulate - Progress: Progressing toward goal PT Goal: Perform Home Exercise Program - Progress: Progressing toward goal  Visit Information  Last PT Received On: 05/01/11 Assistance Needed: +1    Subjective Data  Subjective: "I feel al little weaker today" Patient Stated Goal: Would like to go home   Cognition  Overall Cognitive Status: Appears within functional limits for tasks assessed/performed Arousal/Alertness: Awake/alert Orientation Level: Appears intact for tasks assessed Behavior During Session: [facility] for tasks performed    Balance     End of Session PT - End of Session Activity Tolerance: Patient limited by fatigue Patient left: in chair;with call bell/phone within reach    Rebeca Alert [facility] 05/01/2011, 11:28 AM [phone]

## 2011-05-02 LAB — COMPREHENSIVE METABOLIC PANEL
ALT: 17 U/L (ref 0–35)
AST: 19 U/L (ref 0–37)
Alkaline Phosphatase: 63 U/L (ref 39–117)
CO2: 22 mEq/L (ref 19–32)
Chloride: 107 mEq/L (ref 96–112)
GFR calc Af Amer: 75 mL/min — ABNORMAL LOW (ref >90)
GFR calc non Af Amer: 64 mL/min — ABNORMAL LOW (ref >90)
Glucose, Bld: 100 mg/dL — ABNORMAL HIGH (ref 70–99)
Potassium: 3.1 mEq/L — ABNORMAL LOW (ref 3.5–5.1)
Sodium: 139 mEq/L (ref 135–145)
Total Bilirubin: 0.2 mg/dL — ABNORMAL LOW (ref 0.3–1.2)

## 2011-05-02 LAB — CULTURE, BLOOD (ROUTINE X 2)
Culture  Setup Time: 201304180125
Culture: NO GROWTH

## 2011-05-02 LAB — CBC
Hemoglobin: 12.2 g/dL (ref 12.0–15.0)
MCH: 29.1 pg (ref 26.0–34.0)
Platelets: 288 10*3/uL (ref 150–400)
RBC: 4.19 MIL/uL (ref 3.87–5.11)
WBC: 11.2 10*3/uL — ABNORMAL HIGH (ref 4.0–10.5)

## 2011-05-02 NOTE — Progress Notes (Signed)
   CARE MANAGEMENT NOTE 05/02/2011  Patient:  Nancy Cole, Nancy Cole   Account Number:  [id number]  Date Initiated:  04/26/2011  Documentation initiated by:  Lanier Clam  Subjective/Objective Assessment:   ADMITTED W/FALL,HYPOTENSION     Action/Plan:   FROM HOME,ALONE.   Anticipated DC Date:  05/02/2011   Anticipated DC Plan:  HOME W HOME HEALTH SERVICES  In-house referral  Clinical Social Worker         Choice offered to / List presented to:  C-1 Patient   DME arranged  [facility] - Lavone Nian      DME agency  [facility]     [facility] arranged  HH-1 RN  HH-2 PT  HH-3 OT  HH-4 NURSE'S AIDE      HH agency  [facility]   Status of service:  Completed, signed off Medicare Important Message given?   (If response is "NO", the following Medicare IM given date fields will be blank) Date Medicare IM given:   Date Additional Medicare IM given:    Discharge Disposition:  HOME W HOME HEALTH SERVICES  Per UR Regulation:  Reviewed for med. necessity/level of care/duration of stay  If discussed at Long Length of Stay Meetings, dates discussed:    Comments:  05/02/11 Macauley Mossberg RN,BSN NCM [phone] DENIED INS AUTH FOR SNF.MD UPDATED. [facility] CONTACTED FOR HH/DME.  04/30/11 Kynesha Guerin RN,BSN NCM [phone] PATIENT AGREE SNF.CSW FOLLOWING.LIKELY D/C AM IF MED STABLE PER MD.  04/26/11 Argie Lober RN,BSN NCM [phone] PT-SNF VS HH. PATIENT AGREE SNF. CSW NOTIFIED.

## 2011-05-02 NOTE — Progress Notes (Signed)
Occupational Therapy Treatment Patient Details Name: Nancy Cole MRN: [id number] DOB: June 14, 1934 Today's Date: 05/02/2011 Time: 4098-1191 OT Time Calculation (min): 10 min  OT Assessment / Plan / Recommendation Comments on Treatment Session D/c plan is now for home. Insurance denied [facility].     Follow Up Recommendations  Home health OT;Supervision/Assistance - 24 hour    Equipment Recommendations  None recommended by OT    Frequency Min 2X/week   Plan Discharge plan needs to be updated    Precautions / Restrictions Precautions Precautions: Fall   Pertinent Vitals/Pain     ADL  Grooming: Performed;Wash/dry hands Where Assessed - Grooming: Standing at sink Toilet Transfer: Research scientist (life sciences) Method: Proofreader: Regular height toilet;Grab bars Toileting - Clothing Manipulation: Simulated;Supervision/safety Where Assessed - Glass blower/designer Manipulation: Sit to stand from 3-in-1 or toilet Toileting - Hygiene: Simulated;Supervision/safety Where Assessed - Toileting Hygiene: Sit to stand from 3-in-1 or toilet    OT Goals ADL Goals ADL Goal: Grooming - Progress: Progressing toward goals ADL Goal: Toilet Transfer - Progress: Progressing toward goals ADL Goal: Toileting - Clothing Manipulation - Progress: Progressing toward goals ADL Goal: Toileting - Hygiene - Progress: Progressing toward goals  Visit Information  Last OT Received On: 05/02/11 Assistance Needed: +1    Subjective Data  Subjective: "My insurance denied me rehab. Now I'm going home."   Prior Functioning       Cognition       Mobility Bed Mobility Supine to Sit: 5: Supervision;HOB elevated Transfers Sit to Stand: 5: Supervision;From bed;From toilet;With upper extremity assist Stand to Sit: 5: Supervision;With upper extremity assist;To chair/3-in-1;To toilet;With armrests Details for Transfer Assistance: VCs for safety   Exercises    Balance      End of Session OT - End of Session Activity Tolerance: Patient tolerated treatment well Patient left: in chair;with call bell/phone within reach   Adyen Bifulco A  05/02/2011, 12:27 PM

## 2011-05-02 NOTE — Progress Notes (Signed)
Discharge instructions discussed with patient and prescriptions given. Patient denies any questions at this time.

## 2011-05-02 NOTE — Discharge Instructions (Signed)

## 2011-05-02 NOTE — Discharge Summary (Signed)
Physician Discharge Summary  DISCHARGE SUMMARY   Patient ID: Nancy Cole MR#: [id number] DOB/AGE: [id number] 75 y.o.   Attending Physician:Tyde Lamison M  Patient's WUJ:WJXBJ,YNWG M, MD, MD  Consults: none  Admit date: 04/25/2011 Discharge date: 05/02/2011  Discharge Diagnoses:  Principal Problem:  *Urosepsis Active Problems:  OBSTRUCTIVE SLEEP APNEA  ASTHMA  COPD  Dehydration  Hypotension  Pyelonephritis  Azotemia  Leukocytosis  Candidiasis   Patient Active Problem List  Diagnoses  . THRUSH  . ADENOCARCINOMA, COLON  . OBSTRUCTIVE SLEEP APNEA  . ASTHMA  . COPD  . OSTEOARTHRITIS  . DYSPNEA  . DIARRHEA  . Rhinitis  . Dehydration  . Hypotension  . Pyelonephritis  . Urosepsis  . Azotemia  . Leukocytosis  . Candidiasis   Past Medical History  Diagnosis Date  . COPD (chronic obstructive pulmonary disease)     PFT 2007 FEV1/FVC 43%  PFT 01/12/10 FEV1 1.0/51%, FVC 2.0/79%, FEV1/FVC 0.48  . Osteoarthritis   . DDD (degenerative disc disease)   . OSA on CPAP   . Seasonal allergies   . Asthma   . Diverticulitis     Discharged Condition: good   Discharge Medications: Medication List  As of 05/02/2011 10:20 AM   STOP taking these medications         HYDROcodone-homatropine 5-1.5 MG/5ML syrup      omeprazole 20 MG tablet         TAKE these medications         acetaminophen 325 MG tablet   Commonly known as: TYLENOL   Take 2 tablets (650 mg total) by mouth every 6 (six) hours as needed for pain or fever.      PROAIR HFA 108 (90 BASE) MCG/ACT inhaler   Generic drug: albuterol   Inhale 2 puffs into the lungs every 6 (six) hours as needed. Wheezing/shortness of breath.      albuterol (5 MG/ML) 0.5% nebulizer solution   Commonly known as: PROVENTIL   Take 0.5 mLs (2.5 mg total) by nebulization every 6 (six) hours as needed for wheezing or shortness of breath.      antiseptic oral rinse Liqd   15 mLs by Mouth Rinse route 2 (two) times daily.     cetirizine 10 MG tablet   Commonly known as: ZYRTEC   Take 10 mg by mouth daily.      ciprofloxacin 500 MG tablet   Commonly known as: CIPRO   Take 1 tablet (500 mg total) by mouth 2 (two) times daily.      cromolyn 5.2 MG/ACT nasal spray   Commonly known as: NASALCROM   1 spray by Nasal route 3 (three) times daily.      Flora-Q Caps   Take 1 capsule by mouth daily.      fluticasone 50 MCG/ACT nasal spray   Commonly known as: FLONASE   Place 2 sprays into the nose daily.      levothyroxine 100 MCG tablet   Commonly known as: SYNTHROID, LEVOTHROID   Take 100 mcg by mouth daily.      montelukast 10 MG tablet   Commonly known as: SINGULAIR   Take 1 tablet (10 mg total) by mouth at bedtime.      nystatin 100000 UNIT/GM Powd   Apply 1 g (100,000 Units total) topically 3 (three) times daily. Prn to affected areas.      nystatin 100000 UNIT/ML suspension   Commonly known as: MYCOSTATIN   Take 5 mLs (500,000 Units total) by mouth 4 (  four) times daily. Take until resolved      omeprazole 20 MG capsule   Commonly known as: PRILOSEC   Take 20 mg by mouth daily.      potassium chloride 10 MEQ tablet   Commonly known as: K-DUR,KLOR-CON   2 tabs twice daily      sertraline 100 MG tablet   Commonly known as: ZOLOFT   Take 100 mg by mouth daily.      tiotropium 18 MCG inhalation capsule   Commonly known as: SPIRIVA   Place 18 mcg into inhaler and inhale daily.      Vitamin D 2000 UNITS Caps   Take 1 capsule by mouth daily.            Hospital Procedures: Dg Chest 1 View  04/26/2011  *RADIOLOGY REPORT*  Clinical Data: Shortness of breath  CHEST - 1 VIEW  Comparison: 04/25/2011  Findings: Cardiomediastinal silhouette is stable.  No pulmonary edema.  Persistent streaky bilateral basilar atelectasis or infiltrate.  IMPRESSION: No pulmonary edema.  Persistent streaky bilateral basilar atelectasis or infiltrate.  Original Report Authenticated By: Natasha Mead, M.D.   Ct Head Wo  Contrast  04/25/2011  *RADIOLOGY REPORT*  Clinical Data: Nausea and emesis.  Fall.  CT HEAD WITHOUT CONTRAST  Technique:  Contiguous axial images were obtained from the base of the skull through the vertex without contrast.  Comparison: None.  Findings: Lacunar infarcts of the cerebellum bilaterally are age indeterminate.  A larger area of hypoattenuation in the inferior left cerebellum also likely represents ischemia, age indeterminate. There is slight prominence of the extra-axial CSF within the left posterior fossa, likely representing an arachnoid cyst. A focal calcification within the left temporal lobe measures 6 mm on image 14.  Moderate periventricular and diffuse confluent subcortical white matter hypoattenuation is present bilaterally.  No acute supratentorial cortical infarct, hemorrhage, or mass lesion is present.  The ventricles are proportionate to the degree of atrophy.  No significant extra-axial fluid collection is present.  The paranasal sinuses and mastoid air cells are clear.  The osseous skull is intact.  IMPRESSION:  1.  Age indeterminate lacunar infarcts of the cerebellum bilaterally. 2.  Age indeterminate left inferior cerebellar infarct, measuring up to 2.5 cm. 3.  Atrophy and extensive white matter disease within the supratentorial brain.  This likely reflects the sequelae of chronic microvascular ischemia. 4.  Focal calcification within the left temporal lobe.  This may be related to prior infection or contusion.  Neoplasm is considered less likely.  MRI without and with contrast would be useful for further evaluation as clinically indicated.  Original Report Authenticated By: Jamesetta Orleans. MATTERN, M.D.   US Renal  04/26/2011  *RADIOLOGY REPORT*  Clinical Data: Renal failure, dehydration.  RENAL/URINARY TRACT ULTRASOUND COMPLETE  Comparison:  None.  Findings:  Right Kidney:  11.1 cm with normal parenchymal echogenicity.  No hydronephrosis.  Left Kidney:  10.9 cm with normal  parenchymal echogenicity.  There is mild renal cortical thinning.  No hydronephrosis.  Bladder:  Normal.  IMPRESSION: No acute findings.  Possible left renal cortical thinning.  Original Report Authenticated By: Reyes Ivan, M.D.   Dg Chest Port 1 View  04/25/2011  *RADIOLOGY REPORT*  Clinical Data: Shortness of breath and cough.  PORTABLE CHEST - 1 VIEW  Comparison: None.  Findings: Streaky bibasilar airspace opacities are most consistent with subsegmental atelectasis.  Lungs otherwise appear clear. Heart size is normal.  No pneumothorax or pleural fluid.  IMPRESSION: Streaky bibasilar  airspace opacity most consistent with subsegmental atelectasis.  Original Report Authenticated By: Bernadene Bell. Maricela Curet, M.D.    History of Present Illness: 76 F with Asthma/COPD, HTN, hypothyroidism, OSA on BPAP/CPAP, h/o diverticulitis S/P sig colectomy and L Mucinous Cystadenoma removal (02/18/07), Chronic Diarrhea per Dr Dulce Sellar, Ventral Hernia, AVMs, osteopenia, bilateral cataracts, GERD, allergic rhinitis, low back pain/OA, & Ventral Hernia who presents to ED after being found down. She would not come to phone or door for family so they came into her house and found her down. EMS called and came out. She was Hypotensive 70's/30's, Dehydrated. She claimed N/V/D for 2-3 days prior. She was given IVFs. B/c she hit her head she had a CCT with old CVA's - nothing acute. Labs showed a significant Leukocytosis =36.5K and Azotemia with Bun/Cr 24/2.1 and lactate 3. C dif ordered. She has a UTI. CXR ATX. Some low sats - given a Neb. Tachy 110-120 with mild wheeze noted by ED doc. I was called for inpt admit   Hospital Course: Admitted with Pyleonephritis/Urosepsis/Leukocytosis/Azotemia/DeHydration/Hypokalemia/Falls  S/p IV then PO Cipro, IV Vanco x one, and IVF.  Improved nicely with above treatment. Only issue that arose during hospitalization was candidiasis and Herpetic lip lesions. Diarrhea controlled. C dif (-)    Still weak but improving.   Problem # 1: Pyleonephritis/Urosepsis/Leukocytosis/Azotemia/DeHydration/Hypokalemia/Falls Hypotension and Dehydration improved with IVF.  Her Cortisol was High.  S/p IV then PO Cipro and IVF. Finish 14 days total ABx  Her U Cx was Pan Sensitive E Coli. She is S/P Rhabdo. Peak CK 855. Myoglobin 1462 = resolved.  Currently improved She had recurrent Hypokalemia and had to have her K replaced. Kidney bladder US was unremarkable..o/w (-). Cr down to 0.7  WBC down to 8  Hbg 13.1   Problem # 2: ASTHMA (ICD-493.90)  asthma/COPD--Formerly steroid dependent Dr Maple Hudson [phone]. She is not acting adrenally insufficient am cortisol fine. (-) Alpha 1 AntiTrypsin  PFTs 01/12/10 just prior to surgery - FEV1/FVC 0.48 We kept her on pulm toilet, we weaned her off O2.  Problem # 3: HYPERTENSION (ICD-401.9)  Off all meds. Monitor.  Problem # 4: HYPOTHYROIDISM (ICD-244.9)  on meds and doing well = Synthroid 100 Mcg Tabs (Levothyroxine sodium) .Marland Kitchen... 1 tab daily   Problem # 5: OBSTRUCTIVE SLEEP APNEA (ICD-327.23)  OSA CPAP 9 cm H20 AHI 17.9   Problem # 6: DIARRHEA (ICD-787.91) - currently not much of an issue. C Dif (-) - No isolation needed. Chronic Diarrhea per Dr Dulce Sellar  h/o diverticulitis S/P sig colectomy and L Mucinous Cystadenoma removal (02/18/07),  Had CT scan (-). Next Colonoscopy is next year at 89.   Problem # 7: DEPRESSION, MILD (ICD-311)  continue Zoloft 100 Mg Tabs (Sertraline hcl) .Marland Kitchen... 1 po qd   Problem # 8: BACK PAIN, LUMBAR (ICD-724.2)  Dr Alvester Morin has done Shots and nerve blocks. Dr Cleophas Dunker prn. She had SEs to Ultram and may need prn Vicodin.  Problem # 9: OSTEOPENIA (ICD-733.90)  H/O left fracture arm/osteopenia  Dexa 10/06 and Dr Nicholas Lose had been following.  r Fem neck (-) 1.6   Problem # 10: GERD (ICD-530.81)  GERD - stable on omeprazole.   Problem # 11: ALLERGIC RHINITIS (ICD-477.9)  allergic rhinitis. Seasonal and worse in summer.   Zyrtec Allergy 10 Mg Caps (Cetirizine hcl) .Marland Kitchen... 1 qhs   Problem 12 - DVT proph was provided throughout her hospitalization.  Problem #13 - CVAs on CCT - continue to work on RF reduction when she is better.  May need MRI - I will handkle as outpatient.  Problem # 14.  Lip Bl;isters - S/P one day Valtrex 1000 BID  Problem #15 Thrush and candidiasis - Nystatin and one dose Diflucan.  Problem #16 do to her issues she is left with waeakness and deconditioning - PT/OT/CW/SW saw pt and she will need 1-2 weeks rehab at SNF prior to returning home.   Doing much better than admit.  Breathing well.  No new C/O.  Mouth is a little better.  Swallowing a little better. We sent her FL-[facility] out for rehab beds - she was accepted.  Her insurance denied rehab stay although documentation was there that it would have been helpful and only necessary for 1-2 weeks. We will now send her home.  She will get lifeline and have her family provide assistance. i will set up HHPT and OT. I will get her Rxs. She is feeling well and can go home today    Day of Discharge Exam BP 131/89  Pulse 103  Temp(Src) 98.1 F (36.7 C) (Oral)  Resp 19  Ht 5\' 1"  (1.549 m)  Wt 72.938 kg (160 lb 12.8 oz)  BMI 30.38 kg/m2  SpO2 95%  Physical Exam: General appearance: In Bed.  Difficult sitting up, but steady on feet.  Mouth - lips less swollen and less blisters Resp: Clear  Cardio: reg  GI: soft, non-tender; bowel sounds normal; no masses, no organomegaly  R Flank better  Extremities: extremities normal, atraumatic, no cyanosis or edema. RTKR scar  Pulses: 2+ and symmetric  Lymph nodes: no cervical lymphadenopathy  Neurologic: Alert and oriented X 3, normal strength and tone. Normal symmetric reflexes.   Discharge Labs:  [facility] 05/02/11 0452 05/01/11 0510  NA 139 135  K 3.1* 3.2*  CL 107 102  CO2 22 24  GLUCOSE 100* 91  BUN 17 15  CREATININE 0.86 0.76  CALCIUM 8.9 8.7  MG -- --  PHOS -- --    Basename  05/02/11 0452 05/01/11 0510  AST 19 18  ALT 17 16  ALKPHOS 63 65  BILITOT 0.2* 0.3  PROT 5.9* 5.9*  ALBUMIN 2.8* 2.9*    Basename 05/02/11 0452 05/01/11 0510  WBC 11.2* 10.6*  NEUTROABS -- --  HGB 12.2 12.4  HCT 36.6 36.9  MCV 87.4 87.2  PLT 288 222   No results found for this basename: CKTOTAL:3,CKMB:3,CKMBINDEX:3,TROPONINI:3 in the last 72 hours No results found for this basename: TSH,T4TOTAL,FREET3,T3FREE,THYROIDAB in the last 72 hours No results found for this basename: VITAMINB12:2,FOLATE:2,FERRITIN:2,TIBC:2,IRON:2,RETICCTPCT:2 in the last 72 hours No results found for this basename: INR,  PROTIME   Micro Results:  Recent Results (from the past 240 hour(s))   URINE CULTURE Status: Normal    Collection Time    04/25/11 2:33 PM   Component  Value  Range  Status  Comment    Specimen Description  URINE, CATHETERIZED   Final     Special Requests  NONE   Final     Culture Setup Time  [id number]   Final     Colony Count  >=100,000 COLONIES/ML   Final     Culture  ESCHERICHIA COLI   Final     Report Status  04/27/2011 FINAL   Final     Organism ID, Bacteria  ESCHERICHIA COLI   Final    CULTURE, BLOOD (ROUTINE X 2) Status: Normal (Preliminary result)    Collection Time    04/25/11 6:51 PM   Component  Value  Range  Status  Comment    Specimen Description  BLOOD LEFT HAND   Final     Special Requests  BOTTLES DRAWN AEROBIC AND ANAEROBIC   Final     Culture Setup Time  [id number]   Final     Culture    Final     Value:  BLOOD CULTURE RECEIVED NO GROWTH TO DATE CULTURE WILL BE HELD FOR 5 DAYS BEFORE ISSUING A FINAL NEGATIVE REPORT    Report Status  PENDING   Incomplete    CULTURE, BLOOD (ROUTINE X 2) Status: Normal (Preliminary result)    Collection Time    04/25/11 8:07 PM   Component  Value  Range  Status  Comment    Specimen Description  BLOOD RIGHT ARM   Final     Special Requests  BOTTLES DRAWN AEROBIC AND ANAEROBIC 5CC   Final     Culture Setup Time   [id number]   Final     Culture    Final     Value:  BLOOD CULTURE RECEIVED NO GROWTH TO DATE CULTURE WILL BE HELD FOR 5 DAYS BEFORE ISSUING A FINAL NEGATIVE REPORT    Report Status  PENDING   Incomplete    CLOSTRIDIUM DIFFICILE BY PCR Status: Normal    Collection Time    04/26/11 9:15 AM   Component  Value  Range  Status  Comment    C difficile by pcr  NEGATIVE  NEGATIVE  Final           Discharge instructions:    Disposition: SNF  Follow-up Appts: Follow-up with Dr. Timothy Lasso at [facility] 2 weeks .  Call for appointment.  Condition on Discharge: Stable  Tests Needing Follow-up: BMET, CBC one-two weeks post D/C  Time spent in discharge (includes decision making & examination of pt): 35 min  Signed: Reigan Tolliver M 05/02/2011, 10:20 AM

## 2011-05-04 NOTE — Progress Notes (Signed)
   CARE MANAGEMENT NOTE 05/04/2011  Patient:  Nancy Cole, Nancy Cole   Account Number:  [id number]  Date Initiated:  04/26/2011  Documentation initiated by:  Lanier Clam  Subjective/Objective Assessment:   ADMITTED W/FALL,HYPOTENSION     Action/Plan:   FROM HOME,ALONE.   Anticipated DC Date:  05/02/2011   Anticipated DC Plan:  HOME W HOME HEALTH SERVICES  In-house referral  Clinical Social Worker         Choice offered to / List presented to:  C-1 Patient   DME arranged  [facility] - Lavone Nian      DME agency  [facility]     [facility] arranged  HH-1 RN  HH-2 PT  HH-3 OT  HH-4 NURSE'S AIDE      HH agency  [facility]   Status of service:  Completed, signed off Medicare Important Message given?   (If response is "NO", the following Medicare IM given date fields will be blank) Date Medicare IM given:   Date Additional Medicare IM given:    Discharge Disposition:  HOME W HOME HEALTH SERVICES  Per UR Regulation:  Reviewed for med. necessity/level of care/duration of stay  If discussed at Long Length of Stay Meetings, dates discussed:    Comments:  05/04/11 Rihanna Marseille RN,BSN NCM [phone] INFORMED 05/03/11  IN THE AM FROM [facility] THEY COULD NOT PROVIDE SERVICES FOR HH AFTER THEY HAD ACCEPTED THE CASE.TC PATIENT ABOUT ANOTHER HHC AGENCY-[facility] CHOSEN,& CONTACTED [facility] FOR HH.ALSO RECEIVED CALL FROM [facility] STAFF ABOUT HHC.  05/02/11 Traylon Schimming RN,BSN NCM [phone] DENIED INS AUTH FOR SNF.MD UPDATED. [facility] CONTACTED FOR HH/DME.  04/30/11 Rey Dansby RN,BSN NCM [phone] PATIENT AGREE SNF.CSW FOLLOWING.LIKELY D/C AM IF MED STABLE PER MD.  04/26/11 Nigel Wessman RN,BSN NCM [phone] PT-SNF VS HH. PATIENT AGREE SNF. CSW NOTIFIED.

## 2011-06-06 ENCOUNTER — Other Ambulatory Visit: Payer: Self-pay | Admitting: *Deleted

## 2011-06-06 MED ORDER — TIOTROPIUM BROMIDE MONOHYDRATE 18 MCG IN CAPS: 18.0000 ug | ORAL_CAPSULE | Freq: Every day | RESPIRATORY_TRACT | Status: DC

## 2011-10-23 ENCOUNTER — Encounter (HOSPITAL_COMMUNITY): Payer: Self-pay | Admitting: Emergency Medicine

## 2011-10-23 ENCOUNTER — Emergency Department (HOSPITAL_COMMUNITY): Payer: Medicare Other

## 2011-10-23 ENCOUNTER — Observation Stay (HOSPITAL_COMMUNITY)
Admission: EM | Admit: 2011-10-23 | Discharge: 2011-10-24 | Disposition: A | Discharge status: Home / Self Care | Payer: Medicare Other | Attending: Internal Medicine | Admitting: Internal Medicine

## 2011-10-23 DIAGNOSIS — G4733 Obstructive sleep apnea (adult) (pediatric): Secondary | ICD-10-CM | POA: Insufficient documentation

## 2011-10-23 DIAGNOSIS — R200 Anesthesia of skin: Secondary | ICD-10-CM

## 2011-10-23 DIAGNOSIS — J449 Chronic obstructive pulmonary disease, unspecified: Secondary | ICD-10-CM | POA: Insufficient documentation

## 2011-10-23 DIAGNOSIS — G459 Transient cerebral ischemic attack, unspecified: Secondary | ICD-10-CM

## 2011-10-23 DIAGNOSIS — I6529 Occlusion and stenosis of unspecified carotid artery: Secondary | ICD-10-CM | POA: Insufficient documentation

## 2011-10-23 DIAGNOSIS — E876 Hypokalemia: Secondary | ICD-10-CM | POA: Insufficient documentation

## 2011-10-23 DIAGNOSIS — K5730 Diverticulosis of large intestine without perforation or abscess without bleeding: Secondary | ICD-10-CM | POA: Insufficient documentation

## 2011-10-23 DIAGNOSIS — R112 Nausea with vomiting, unspecified: Secondary | ICD-10-CM | POA: Insufficient documentation

## 2011-10-23 DIAGNOSIS — Z96659 Presence of unspecified artificial knee joint: Secondary | ICD-10-CM | POA: Insufficient documentation

## 2011-10-23 DIAGNOSIS — E871 Hypo-osmolality and hyponatremia: Secondary | ICD-10-CM | POA: Insufficient documentation

## 2011-10-23 DIAGNOSIS — J4489 Other specified chronic obstructive pulmonary disease: Secondary | ICD-10-CM | POA: Insufficient documentation

## 2011-10-23 DIAGNOSIS — M1990 Unspecified osteoarthritis, unspecified site: Secondary | ICD-10-CM | POA: Insufficient documentation

## 2011-10-23 DIAGNOSIS — E039 Hypothyroidism, unspecified: Secondary | ICD-10-CM | POA: Insufficient documentation

## 2011-10-23 DIAGNOSIS — R209 Unspecified disturbances of skin sensation: Principal | ICD-10-CM | POA: Insufficient documentation

## 2011-10-23 DIAGNOSIS — Z85038 Personal history of other malignant neoplasm of large intestine: Secondary | ICD-10-CM | POA: Insufficient documentation

## 2011-10-23 DIAGNOSIS — IMO0002 Reserved for concepts with insufficient information to code with codable children: Secondary | ICD-10-CM | POA: Insufficient documentation

## 2011-10-23 DIAGNOSIS — R197 Diarrhea, unspecified: Secondary | ICD-10-CM | POA: Insufficient documentation

## 2011-10-23 DIAGNOSIS — Z79899 Other long term (current) drug therapy: Secondary | ICD-10-CM | POA: Insufficient documentation

## 2011-10-23 LAB — URINALYSIS, ROUTINE W REFLEX MICROSCOPIC
Glucose, UA: NEGATIVE mg/dL
Hgb urine dipstick: NEGATIVE
Ketones, ur: NEGATIVE mg/dL
pH: 6.5 (ref 5.0–8.0)

## 2011-10-23 LAB — BASIC METABOLIC PANEL
CO2: 25 mEq/L (ref 19–32)
Chloride: 99 mEq/L (ref 96–112)
Creatinine, Ser: 0.72 mg/dL (ref 0.50–1.10)
Glucose, Bld: 97 mg/dL (ref 70–99)

## 2011-10-23 LAB — CBC WITH DIFFERENTIAL/PLATELET
Basophils Absolute: 0.1 10*3/uL (ref 0.0–0.1)
Eosinophils Relative: 2 % (ref 0–5)
HCT: 40.7 % (ref 36.0–46.0)
Hemoglobin: 13.9 g/dL (ref 12.0–15.0)
Lymphocytes Relative: 29 % (ref 12–46)
Lymphs Abs: 2.3 10*3/uL (ref 0.7–4.0)
MCV: 87.9 fL (ref 78.0–100.0)
Monocytes Absolute: 0.5 10*3/uL (ref 0.1–1.0)
Monocytes Relative: 7 % (ref 3–12)
Neutro Abs: 4.9 10*3/uL (ref 1.7–7.7)
RBC: 4.63 MIL/uL (ref 3.87–5.11)
RDW: 13.9 % (ref 11.5–15.5)
WBC: 8 10*3/uL (ref 4.0–10.5)

## 2011-10-23 LAB — URINE MICROSCOPIC-ADD ON

## 2011-10-23 LAB — POCT I-STAT TROPONIN I: Troponin i, poc: 0.02 ng/mL (ref 0.00–0.08)

## 2011-10-23 MED ORDER — SODIUM CHLORIDE 0.9 % IV BOLUS (SEPSIS)
1000.0000 mL | Freq: Once | INTRAVENOUS | Status: AC
  Administered 2011-10-23: 1000 mL via INTRAVENOUS

## 2011-10-23 MED ORDER — MONTELUKAST SODIUM 10 MG PO TABS
10.0000 mg | ORAL_TABLET | Freq: Every day | ORAL | Status: DC
  Filled 2011-10-23: qty 1

## 2011-10-23 MED ORDER — TIOTROPIUM BROMIDE MONOHYDRATE 18 MCG IN CAPS
18.0000 ug | ORAL_CAPSULE | Freq: Every day | RESPIRATORY_TRACT | Status: DC
  Administered 2011-10-24: 18 ug via RESPIRATORY_TRACT
  Filled 2011-10-23: qty 5

## 2011-10-23 MED ORDER — SODIUM CHLORIDE 0.9 % IJ SOLN: 3.0000 mL | INTRAMUSCULAR | Status: DC | PRN

## 2011-10-23 MED ORDER — ZOLPIDEM TARTRATE 5 MG PO TABS: 5.0000 mg | ORAL_TABLET | Freq: Every evening | ORAL | Status: DC | PRN

## 2011-10-23 MED ORDER — ONDANSETRON HCL 4 MG/2ML IJ SOLN: 4.0000 mg | Freq: Four times a day (QID) | INTRAMUSCULAR | Status: DC | PRN

## 2011-10-23 MED ORDER — ALUM & MAG HYDROXIDE-SIMETH 200-200-20 MG/5ML PO SUSP: 30.0000 mL | Freq: Four times a day (QID) | ORAL | Status: DC | PRN

## 2011-10-23 MED ORDER — SERTRALINE HCL 50 MG PO TABS
100.0000 mg | ORAL_TABLET | Freq: Every day | ORAL | Status: DC
  Administered 2011-10-24: 100 mg via ORAL
  Filled 2011-10-23: qty 2

## 2011-10-23 MED ORDER — ACETAMINOPHEN 325 MG PO TABS: 650.0000 mg | ORAL_TABLET | Freq: Four times a day (QID) | ORAL | Status: DC | PRN

## 2011-10-23 MED ORDER — POTASSIUM CHLORIDE 10 MEQ/100ML IV SOLN
10.0000 meq | Freq: Once | INTRAVENOUS | Status: AC
  Administered 2011-10-23: 10 meq via INTRAVENOUS
  Filled 2011-10-23: qty 100

## 2011-10-23 MED ORDER — ONDANSETRON HCL 8 MG PO TABS
4.0000 mg | ORAL_TABLET | Freq: Four times a day (QID) | ORAL | Status: DC | PRN
  Filled 2011-10-23: qty 0.5

## 2011-10-23 MED ORDER — ASPIRIN EC 325 MG PO TBEC
325.0000 mg | DELAYED_RELEASE_TABLET | Freq: Every day | ORAL | Status: DC
  Administered 2011-10-24 (×2): 325 mg via ORAL
  Filled 2011-10-23 (×2): qty 1

## 2011-10-23 MED ORDER — SODIUM CHLORIDE 0.9 % IV SOLN: 250.0000 mL | INTRAVENOUS | Status: DC | PRN

## 2011-10-23 MED ORDER — SODIUM CHLORIDE 0.9 % IV SOLN
INTRAVENOUS | Status: DC
  Administered 2011-10-23: 19:00:00 via INTRAVENOUS

## 2011-10-23 MED ORDER — DOCUSATE SODIUM 100 MG PO CAPS
100.0000 mg | ORAL_CAPSULE | Freq: Two times a day (BID) | ORAL | Status: DC
  Filled 2011-10-23: qty 1

## 2011-10-23 MED ORDER — ALBUTEROL SULFATE HFA 108 (90 BASE) MCG/ACT IN AERS
2.0000 | INHALATION_SPRAY | Freq: Four times a day (QID) | RESPIRATORY_TRACT | Status: DC | PRN
  Filled 2011-10-23: qty 6.7

## 2011-10-23 MED ORDER — LEVOTHYROXINE SODIUM 100 MCG PO TABS
100.0000 ug | ORAL_TABLET | Freq: Every day | ORAL | Status: DC
  Administered 2011-10-24: 100 ug via ORAL
  Filled 2011-10-23 (×3): qty 1

## 2011-10-23 MED ORDER — POTASSIUM CHLORIDE CRYS ER 20 MEQ PO TBCR
20.0000 meq | EXTENDED_RELEASE_TABLET | Freq: Two times a day (BID) | ORAL | Status: DC
  Administered 2011-10-24 (×2): 20 meq via ORAL
  Filled 2011-10-23 (×3): qty 1

## 2011-10-23 MED ORDER — SODIUM CHLORIDE 0.9 % IJ SOLN: 3.0000 mL | Freq: Two times a day (BID) | INTRAMUSCULAR | Status: DC

## 2011-10-23 MED ORDER — LORATADINE 10 MG PO TABS
10.0000 mg | ORAL_TABLET | Freq: Every day | ORAL | Status: DC
  Administered 2011-10-24: 10 mg via ORAL
  Filled 2011-10-23: qty 1

## 2011-10-23 MED ORDER — ACETAMINOPHEN 650 MG RE SUPP: 650.0000 mg | Freq: Four times a day (QID) | RECTAL | Status: DC | PRN

## 2011-10-23 MED ORDER — PANTOPRAZOLE SODIUM 40 MG PO TBEC
40.0000 mg | DELAYED_RELEASE_TABLET | Freq: Every day | ORAL | Status: DC
Start: 2011-10-24 — End: 2011-10-24
  Administered 2011-10-24: 40 mg via ORAL
  Filled 2011-10-23: qty 1

## 2011-10-23 NOTE — ED Notes (Signed)
Pt c/o left sided arm leg numbness starting last night; mild grip strength weakness noted but no other neuro deficits; pt sts 3 days of N/V/D recently

## 2011-10-23 NOTE — H&P (Signed)
Physician Admission History and Physical     PCP:   Gwen Pounds, MD   Chief Complaint:  Paresthesia and numbness of L side   HPI: Nancy Cole is an 77 y.o. female.  Last night starting having numbness and tingling of her L arm. She went to sleep and woke up this AM w/ the feelings along the entire L side of body. Never experienced any weakness in the extremities but did have clear numbness and tingling. She came to the ED for further eval. She had MRI that ruled out acute CVA but did have possible occlusion of her distal L vertebral artery. Vasc was consulted/curbsided by the ED and they stated they did not feel intervention was necessary but that medicine should admit the patient for monitoring and further w/u of possible TIA. Upon exam she has almost complete resolution of the feelings but does still have some numbness in her L leg. Strength was 5/5 in all extremities on exam. K was low but otherwise CBC and BMP relatively normal. She has no other complaints   Review of Systems:  Neg except as noted in HPI   Past Medical History: Past Medical History  Diagnosis Date  . COPD (chronic obstructive pulmonary disease)     PFT 2007 FEV1/FVC 43%  PFT 01/12/10 FEV1 1.0/51%, FVC 2.0/79%, FEV1/FVC 0.48  . Osteoarthritis   . DDD (degenerative disc disease)   . OSA on CPAP   . Seasonal allergies   . Asthma   . Diverticulitis    Past Surgical History  Procedure Date  . Pleural scarification 1979    recurrent spontaneuous--L--- finally had tac pleurodesis  . Appendectomy   . Cholecystectomy   . Total knee arthroplasty     L  . Partial colectomy 2009    for diverticulitis  . Cataract extraction, bilateral     Medications: Prior to Admission medications   Medication Sig Start Date End Date Taking? Authorizing Provider  acetaminophen (TYLENOL) 325 MG tablet Take 650 mg by mouth every 6 (six) hours as needed. For pain 05/01/11 04/30/12 Yes Gwen Pounds, MD  albuterol [facility] HFA) 108 (90  BASE) MCG/ACT inhaler Inhale 2 puffs into the lungs every 6 (six) hours as needed. Wheezing/shortness of breath.   Yes Historical Provider, MD  cetirizine (ZYRTEC) 10 MG tablet Take 10 mg by mouth daily.    Yes Historical Provider, MD  Cholecalciferol (VITAMIN D) 2000 UNITS CAPS Take 1 capsule by mouth daily.     Yes Historical Provider, MD  levothyroxine (SYNTHROID, LEVOTHROID) 100 MCG tablet Take 100 mcg by mouth daily.     Yes Historical Provider, MD  montelukast (SINGULAIR) 10 MG tablet Take 10 mg by mouth at bedtime.   Yes Historical Provider, MD  omeprazole (PRILOSEC) 20 MG capsule Take 20 mg by mouth daily.   Yes Historical Provider, MD  potassium chloride (K-DUR,KLOR-CON) 10 MEQ tablet Take 20 mEq by mouth 2 (two) times daily. 2 tabs twice daily 09/14/10  Yes Historical Provider, MD  sertraline (ZOLOFT) 100 MG tablet Take 100 mg by mouth daily.   Yes Historical Provider, MD  tiotropium (SPIRIVA) 18 MCG inhalation capsule Place 18 mcg into inhaler and inhale daily. 06/06/11  Yes Waymon Budge, MD  montelukast (SINGULAIR) 10 MG tablet Take 1 tablet (10 mg total) by mouth at bedtime. 10/13/10 10/13/11  Waymon Budge, MD    Allergies:   Allergies  Allergen Reactions  . Penicillins     REACTION: rash  .  Sulfamethoxazole     REACTION: unspecified    Social History:  reports that she has never smoked. She has never used smokeless tobacco. She reports that she does not drink alcohol or use illicit drugs.  Family History: Family History  Problem Relation Age of Onset  . COPD Father     heavy smoker  . Heart attack Mother   . COPD Brother     smoker    Physical Exam: Filed Vitals:   10/23/11 1945 10/23/11 2000 10/23/11 2015 10/23/11 2034  BP: 135/69 132/73 134/79   Pulse: 57 59 59   Temp:    97.5 F (36.4 C)  TempSrc:      Resp: 12 17 16    SpO2: 98% 97% 97%    General appearance: elderly female in NAD  Head: Normocephalic, without obvious abnormality, atraumatic Eyes:  conjunctivae/corneas clear. PERRL, EOM's intact.  Nose: Nares normal. Septum midline. Mucosa normal. No drainage or sinus tenderness. Throat: lips, mucosa, and tongue normal; teeth and gums normal Neck: no adenopathy, no carotid bruits Resp: CTAB, no wheezes or rales  Cardio: RRR, no MRG  GI: soft, non-tender; bowel sounds normal; no masses,  no organomegaly Extremities: extremities normal, atraumatic, no cyanosis or edema Pulses: 2+ and symmetric Lymph nodes: Cervical adenopathy: no cervical lymphadenopathy Neurologic: Alert and oriented X 3, normal strength and tone of all extremities     Labs on Admission:   Basename 10/23/11 1235  NA 134*  K 3.1*  CL 99  CO2 25  GLUCOSE 97  BUN 12  CREATININE 0.72  CALCIUM 9.8  MG --  PHOS --   Basename 10/23/11 1235  WBC 8.0  NEUTROABS 4.9  HGB 13.9  HCT 40.7  MCV 87.9  PLT 290    Radiological Exams on Admission: Ct Head Wo Contrast  10/23/2011  *RADIOLOGY REPORT*  Clinical Data: Left-sided arm numbness since last evening.  3 days of nausea, vomiting diarrhea.  CT HEAD WITHOUT CONTRAST  Technique:  Contiguous axial images were obtained from the base of the skull through the vertex without contrast.  Comparison: 04/25/2011.  Findings: No intracranial hemorrhage.  Prominent small vessel disease type changes and remote infarcts without CT evidence of large acute infarct.  No significant change in the appearance of the nonspecific calcification in the posterior left temporal lobe.  Vascular calcifications.  Global atrophy without hydrocephalus.  No intracranial mass lesion detected on this unenhanced exam.  Visualized sinuses and mastoid air cells are clear.  IMPRESSION: No intracranial hemorrhage or CT evidence of large acute infarct. Please see above.   Original Report Authenticated By: Fuller Canada, M.D.    Mr Brain Wo Contrast  10/23/2011  *RADIOLOGY REPORT*  Clinical Data: Left arm and leg numbness.  Vomiting and diarrhea.  MRI  HEAD WITHOUT CONTRAST  Technique:  Multiplanar, multiecho pulse sequences of the brain and surrounding structures were obtained according to standard protocol without intravenous contrast.  Comparison: CT head 10/23/2011  Findings: Negative for acute infarct.  Generalized atrophy of a moderate degree.  Moderate chronic microvascular ischemia throughout the cerebral white matter bilaterally.  Chronic infarcts in the cerebellum bilaterally.  Small calcification in the left temporal parietal lobe is noted on the CT.  There is no surrounding edema and this is most likely a chronic finding due to old infection or hemorrhage.  No other areas of possible hemorrhage are present.  No shift of  the midline structures.  Increased signal in the distal vertebral artery may represent occlusion.  Right vertebral artery and basilar patent.  Internal carotid artery is patent bilaterally.  Paranasal sinuses show mild mucosal edema.  IMPRESSION: Atrophy and chronic microvascular ischemia.  No acute infarct.  Possible stenosis or occlusion of the distal left vertebral artery.   Original Report Authenticated By: Camelia Phenes, M.D.    Orders placed during the hospital encounter of 10/23/11  . ED EKG  . ED EKG  . EKG 12-LEAD  . EKG 12-LEAD    Assessment/Plan Admit, Dr Timothy Lasso attending   Paraesthesia  - concern over possible TIA   - there is possible occlusion of vertebral artery on MRI   - will give ASA 325 tonight and its need can be reevaluated in AM based on study results   - get TTE w/ bubble, transcranial and carotid dopplars in AM   - tele   - currently all symptoms have resolved   COPD   - cont inhalers   Hypothyroid   - cont synthroid   Allergies   - cont home meds   PPx   - SCDs and PPI   FEN   - normal diet   - SLIV        Skyler Dusing 10/23/2011, 9:15 PM

## 2011-10-23 NOTE — ED Notes (Signed)
Patient transported to MRI 

## 2011-10-23 NOTE — ED Notes (Signed)
Pt remains in MRI 

## 2011-10-23 NOTE — ED Provider Notes (Signed)
History     CSN: [id number]  Arrival date & time 10/23/11  1150   First MD Initiated Contact with Patient 10/23/11 1516      Chief Complaint  Patient presents with  . Numbness  . Emesis  . Diarrhea    (Consider location/radiation/quality/duration/timing/severity/associated sxs/prior treatment) Patient is a 77 y.o. female presenting with vomiting and diarrhea. The history is provided by the patient.  Emesis  Associated symptoms include diarrhea.  Diarrhea The primary symptoms include vomiting and diarrhea.   patient here with 3 days of watery diarrhea and vomiting. No abdominal pain or fever. Denies any urinary symptoms. Last night she began to note left upper extremity numbness which is been persistent until today. She now also notes left location the numbness. Denies any ataxia. No severe headaches. No confusion or speech changes. Denies any visual loss. Nothing makes her symptoms better worse. Denies any fever or cough. No treatment used prior to arrival  Past Medical History  Diagnosis Date  . COPD (chronic obstructive pulmonary disease)     PFT 2007 FEV1/FVC 43%  PFT 01/12/10 FEV1 1.0/51%, FVC 2.0/79%, FEV1/FVC 0.48  . Osteoarthritis   . DDD (degenerative disc disease)   . OSA on CPAP   . Seasonal allergies   . Asthma   . Diverticulitis     Past Surgical History  Procedure Date  . Pleural scarification 1979    recurrent spontaneuous--L--- finally had tac pleurodesis  . Appendectomy   . Cholecystectomy   . Total knee arthroplasty     L  . Partial colectomy 2009    for diverticulitis  . Cataract extraction, bilateral     Family History  Problem Relation Age of Onset  . COPD Father     heavy smoker  . Heart attack Mother   . COPD Brother     smoker    History  Substance Use Topics  . Smoking status: Never Smoker   . Smokeless tobacco: Never Used  . Alcohol Use: No    OB History    Grav Para Term Preterm Abortions TAB SAB Ect Mult Living                    Review of Systems  Gastrointestinal: Positive for vomiting and diarrhea.  All other systems reviewed and are negative.    Allergies  Penicillins and Sulfamethoxazole  Home Medications   Current Outpatient Rx  Name Route Sig Dispense Refill  . ACETAMINOPHEN 325 MG PO TABS Oral Take 650 mg by mouth every 6 (six) hours as needed. For pain    . ALBUTEROL SULFATE HFA 108 (90 BASE) MCG/ACT IN AERS Inhalation Inhale 2 puffs into the lungs every 6 (six) hours as needed. Wheezing/shortness of breath.    . CETIRIZINE HCL 10 MG PO TABS Oral Take 10 mg by mouth daily.     Marland Kitchen VITAMIN D 2000 UNITS PO CAPS Oral Take 1 capsule by mouth daily.      Marland Kitchen LEVOTHYROXINE SODIUM 100 MCG PO TABS Oral Take 100 mcg by mouth daily.      Marland Kitchen MONTELUKAST SODIUM 10 MG PO TABS Oral Take 10 mg by mouth at bedtime.    . OMEPRAZOLE 20 MG PO CPDR Oral Take 20 mg by mouth daily.    Marland Kitchen POTASSIUM CHLORIDE CRYS ER 10 MEQ PO TBCR Oral Take 20 mEq by mouth 2 (two) times daily. 2 tabs twice daily    . SERTRALINE HCL 100 MG PO TABS Oral Take 100 mg  by mouth daily.    Marland Kitchen TIOTROPIUM BROMIDE MONOHYDRATE 18 MCG IN CAPS Inhalation Place 18 mcg into inhaler and inhale daily.    Marland Kitchen MONTELUKAST SODIUM 10 MG PO TABS Oral Take 1 tablet (10 mg total) by mouth at bedtime. 90 tablet 3    BP 153/93  Pulse 74  Temp 97.7 F (36.5 C) (Oral)  Resp 16  SpO2 96%  Physical Exam  Nursing note and vitals reviewed. Constitutional: She is oriented to person, place, and time. She appears well-developed and well-nourished.  Non-toxic appearance. No distress.  HENT:  Head: Normocephalic and atraumatic.  Eyes: Conjunctivae normal, EOM and lids are normal. Pupils are equal, round, and reactive to light.  Neck: Normal range of motion. Neck supple. No tracheal deviation present. No mass present.  Cardiovascular: Normal rate, regular rhythm and normal heart sounds.  Exam reveals no gallop.   No murmur heard. Pulmonary/Chest: Effort normal and  breath sounds normal. No stridor. No respiratory distress. She has no decreased breath sounds. She has no wheezes. She has no rhonchi. She has no rales.  Abdominal: Soft. Normal appearance and bowel sounds are normal. She exhibits no distension. There is no tenderness. There is no rebound and no CVA tenderness.  Musculoskeletal: Normal range of motion. She exhibits no edema and no tenderness.  Neurological: She is alert and oriented to person, place, and time. She has normal strength. No cranial nerve deficit or sensory deficit. GCS eye subscore is 4. GCS verbal subscore is 5. GCS motor subscore is 6.  Skin: Skin is warm and dry. No abrasion and no rash noted.  Psychiatric: She has a normal mood and affect. Her speech is normal and behavior is normal.    ED Course  Procedures (including critical care time)  Labs Reviewed  BASIC METABOLIC PANEL - Abnormal; Notable for the following:    Sodium 134 (*)     Potassium 3.1 (*)     GFR calc non Af Amer 82 (*)     All other components within normal limits  URINALYSIS, ROUTINE W REFLEX MICROSCOPIC - Abnormal; Notable for the following:    Leukocytes, UA TRACE (*)     All other components within normal limits  CBC WITH DIFFERENTIAL  URINE MICROSCOPIC-ADD ON   Ct Head Wo Contrast  10/23/2011  *RADIOLOGY REPORT*  Clinical Data: Left-sided arm numbness since last evening.  3 days of nausea, vomiting diarrhea.  CT HEAD WITHOUT CONTRAST  Technique:  Contiguous axial images were obtained from the base of the skull through the vertex without contrast.  Comparison: 04/25/2011.  Findings: No intracranial hemorrhage.  Prominent small vessel disease type changes and remote infarcts without CT evidence of large acute infarct.  No significant change in the appearance of the nonspecific calcification in the posterior left temporal lobe.  Vascular calcifications.  Global atrophy without hydrocephalus.  No intracranial mass lesion detected on this unenhanced exam.   Visualized sinuses and mastoid air cells are clear.  IMPRESSION: No intracranial hemorrhage or CT evidence of large acute infarct. Please see above.   Original Report Authenticated By: Fuller Canada, M.D.      No diagnosis found.    MDM   Date: 10/23/2011  Rate: 83  Rhythm: normal sinus rhythm  QRS Axis: normal  Intervals: normal  ST/T Wave abnormalities: nonspecific ST changes  Conduction Disutrbances:Incomplete right bundle-branch block  Narrative Interpretation:   Old EKG Reviewed: unchanged   4:32 PM Will order mri to r/o stroke and hydrate with  saline and replace potassium--will place in cdu to finish w/u       Toy Baker, MD 10/23/11 [phone]

## 2011-10-23 NOTE — ED Provider Notes (Signed)
7:18 PM Patient is in CDU holding for IVF, MRI brain.  Sign out received from Dr Freida Busman.  Pt with N/V/D x several days, with new tingling on her left arm and left leg.  Pt states she is feeling much better, has not had vomiting or diarrhea since her arrival, feeling better after IVF. K found to be 3.1, pt was given K by IV.   MRI shows no acute findings within brain, though does show left distal vertebral artery that may be either occluded or stenotic.  Discussed this with Dr Freida Busman who reviewed MRI results and suggested calling vascular surgeon to discuss.    7:35 PM I have discussed the patient and MRI finding with Dr Darrick Penna who states there is nothing to be done about the vertebral artery stenosis vs occlusion.  Suggests patient may have had a TIA and may need carotid duplex scan.  I have discussed this with Dr Freida Busman who requests that I call [facility] (PCP is Dr Timothy Lasso) to admit the patient.    8:21 PM I spoke with with on-call doctor for [facility] who will see and admit the patient.  Patient updated and agrees with plan.    Results for orders placed during the hospital encounter of 10/23/11  CBC WITH DIFFERENTIAL      Component Value Range   WBC 8.0  4.0 - 10.5 K/uL   RBC 4.63  3.87 - 5.11 MIL/uL   Hemoglobin 13.9  12.0 - 15.0 g/dL   HCT 16.1  09.6 - 04.5 %   MCV 87.9  78.0 - 100.0 fL   MCH 30.0  26.0 - 34.0 pg   MCHC 34.2  30.0 - 36.0 g/dL   RDW 40.9  81.1 - 91.4 %   Platelets 290  150 - 400 K/uL   Neutrophils Relative 61  43 - 77 %   Neutro Abs 4.9  1.7 - 7.7 K/uL   Lymphocytes Relative 29  12 - 46 %   Lymphs Abs 2.3  0.7 - 4.0 K/uL   Monocytes Relative 7  3 - 12 %   Monocytes Absolute 0.5  0.1 - 1.0 K/uL   Eosinophils Relative 2  0 - 5 %   Eosinophils Absolute 0.2  0.0 - 0.7 K/uL   Basophils Relative 1  0 - 1 %   Basophils Absolute 0.1  0.0 - 0.1 K/uL  BASIC METABOLIC PANEL      Component Value Range   Sodium 134 (*) 135 - 145 mEq/L   Potassium 3.1 (*) 3.5  - 5.1 mEq/L   Chloride 99  96 - 112 mEq/L   CO2 25  19 - 32 mEq/L   Glucose, Bld 97  70 - 99 mg/dL   BUN 12  6 - 23 mg/dL   Creatinine, Ser 7.82  0.50 - 1.10 mg/dL   Calcium 9.8  8.4 - 95.6 mg/dL   GFR calc non Af Amer 82 (*) >90 mL/min   GFR calc Af Amer >90  >90 mL/min  URINALYSIS, ROUTINE W REFLEX MICROSCOPIC      Component Value Range   Color, Urine YELLOW  YELLOW   APPearance CLEAR  CLEAR   Specific Gravity, Urine 1.005  1.005 - 1.030   pH 6.5  5.0 - 8.0   Glucose, UA NEGATIVE  NEGATIVE mg/dL   Hgb urine dipstick NEGATIVE  NEGATIVE   Bilirubin Urine NEGATIVE  NEGATIVE   Ketones, ur NEGATIVE  NEGATIVE mg/dL   Protein, ur NEGATIVE  NEGATIVE mg/dL   Urobilinogen, UA 0.2  0.0 - 1.0 mg/dL   Nitrite NEGATIVE  NEGATIVE   Leukocytes, UA TRACE (*) NEGATIVE  URINE MICROSCOPIC-ADD ON      Component Value Range   Squamous Epithelial / LPF RARE  RARE   WBC, UA 3-6  <3 WBC/hpf   RBC / HPF 0-3  <3 RBC/hpf   Urine-Other RARE YEAST    POCT I-STAT TROPONIN I      Component Value Range   Troponin i, poc 0.02  0.00 - 0.08 ng/mL   Comment 3            Ct Head Wo Contrast  10/23/2011  *RADIOLOGY REPORT*  Clinical Data: Left-sided arm numbness since last evening.  3 days of nausea, vomiting diarrhea.  CT HEAD WITHOUT CONTRAST  Technique:  Contiguous axial images were obtained from the base of the skull through the vertex without contrast.  Comparison: 04/25/2011.  Findings: No intracranial hemorrhage.  Prominent small vessel disease type changes and remote infarcts without CT evidence of large acute infarct.  No significant change in the appearance of the nonspecific calcification in the posterior left temporal lobe.  Vascular calcifications.  Global atrophy without hydrocephalus.  No intracranial mass lesion detected on this unenhanced exam.  Visualized sinuses and mastoid air cells are clear.  IMPRESSION: No intracranial hemorrhage or CT evidence of large acute infarct. Please see above.    Original Report Authenticated By: Fuller Canada, M.D.    Mr Brain Wo Contrast  10/23/2011  *RADIOLOGY REPORT*  Clinical Data: Left arm and leg numbness.  Vomiting and diarrhea.  MRI HEAD WITHOUT CONTRAST  Technique:  Multiplanar, multiecho pulse sequences of the brain and surrounding structures were obtained according to standard protocol without intravenous contrast.  Comparison: CT head 10/23/2011  Findings: Negative for acute infarct.  Generalized atrophy of a moderate degree.  Moderate chronic microvascular ischemia throughout the cerebral white matter bilaterally.  Chronic infarcts in the cerebellum bilaterally.  Small calcification in the left temporal parietal lobe is noted on the CT.  There is no surrounding edema and this is most likely a chronic finding due to old infection or hemorrhage.  No other areas of possible hemorrhage are present.  No shift of  the midline structures.  Increased signal in the distal vertebral artery may represent occlusion.  Right vertebral artery and basilar patent.  Internal carotid artery is patent bilaterally.  Paranasal sinuses show mild mucosal edema.  IMPRESSION: Atrophy and chronic microvascular ischemia.  No acute infarct.  Possible stenosis or occlusion of the distal left vertebral artery.   Original Report Authenticated By: Camelia Phenes, M.D.       [place], [place] 10/23/11 2221

## 2011-10-24 DIAGNOSIS — J984 Other disorders of lung: Secondary | ICD-10-CM

## 2011-10-24 DIAGNOSIS — G459 Transient cerebral ischemic attack, unspecified: Secondary | ICD-10-CM

## 2011-10-24 LAB — BASIC METABOLIC PANEL
BUN: 10 mg/dL (ref 6–23)
Chloride: 105 mEq/L (ref 96–112)
Creatinine, Ser: 0.68 mg/dL (ref 0.50–1.10)
GFR calc non Af Amer: 83 mL/min — ABNORMAL LOW (ref >90)
Glucose, Bld: 88 mg/dL (ref 70–99)
Potassium: 3.7 mEq/L (ref 3.5–5.1)

## 2011-10-24 LAB — CBC
HCT: 39 % (ref 36.0–46.0)
Hemoglobin: 12.9 g/dL (ref 12.0–15.0)
MCH: 29.4 pg (ref 26.0–34.0)
MCHC: 33.1 g/dL (ref 30.0–36.0)
RDW: 14 % (ref 11.5–15.5)

## 2011-10-24 MED ORDER — SODIUM CHLORIDE 0.9 % IV SOLN: 250.0000 mL | INTRAVENOUS | Status: DC | PRN

## 2011-10-24 MED ORDER — ASPIRIN 325 MG PO TBEC: 325.0000 mg | DELAYED_RELEASE_TABLET | Freq: Every day | ORAL | Status: DC

## 2011-10-24 NOTE — Progress Notes (Signed)
  Echocardiogram 2D Echocardiogram (limited) with bubble study has been performed.  Nancy Cole 10/24/2011, 10:12 AM

## 2011-10-24 NOTE — Discharge Summary (Signed)
Physician Discharge Summary  DISCHARGE SUMMARY   Patient ID: Nancy Cole MR#: [id number] DOB/AGE: 75-Oct-1936 75 y.o.   Attending Physician:Ariyon Mittleman M  Patient's MVH:QIONG,EXBM M, MD  Consults: none  Admit date: 10/23/2011 Discharge date: 10/24/2011  Discharge Diagnoses:  Principal Problem:  *Numbness   Patient Active Problem List  Diagnosis  . THRUSH  . ADENOCARCINOMA, COLON  . OBSTRUCTIVE SLEEP APNEA  . ASTHMA  . COPD  . OSTEOARTHRITIS  . DYSPNEA  . DIARRHEA  . Rhinitis  . Dehydration  . Hypotension  . Pyelonephritis  . Urosepsis  . Azotemia  . Leukocytosis  . Candidiasis  . Numbness   Past Medical History  Diagnosis Date  . COPD (chronic obstructive pulmonary disease)     PFT 2007 FEV1/FVC 43%  PFT 01/12/10 FEV1 1.0/51%, FVC 2.0/79%, FEV1/FVC 0.48  . Osteoarthritis   . DDD (degenerative disc disease)   . OSA on CPAP   . Seasonal allergies   . Asthma   . Diverticulitis     Discharged Condition: good   Discharge Medications:   Medication List     As of 10/24/2011 11:41 AM    TAKE these medications         acetaminophen 325 MG tablet   Commonly known as: TYLENOL   Take 650 mg by mouth every 6 (six) hours as needed. For pain      aspirin 325 MG EC tablet   Take 1 tablet (325 mg total) by mouth daily.      cetirizine 10 MG tablet   Commonly known as: ZYRTEC   Take 10 mg by mouth daily.      levothyroxine 100 MCG tablet   Commonly known as: SYNTHROID, LEVOTHROID   Take 100 mcg by mouth daily.      montelukast 10 MG tablet   Commonly known as: SINGULAIR   Take 1 tablet (10 mg total) by mouth at bedtime.      montelukast 10 MG tablet   Commonly known as: SINGULAIR   Take 10 mg by mouth at bedtime.      omeprazole 20 MG capsule   Commonly known as: PRILOSEC   Take 20 mg by mouth daily.      potassium chloride 10 MEQ tablet   Commonly known as: K-DUR,KLOR-CON   Take 20 mEq by mouth 2 (two) times daily. 2 tabs twice daily     PROAIR HFA 108 (90 BASE) MCG/ACT inhaler   Generic drug: albuterol   Inhale 2 puffs into the lungs every 6 (six) hours as needed. Wheezing/shortness of breath.      sertraline 100 MG tablet   Commonly known as: ZOLOFT   Take 100 mg by mouth daily.      tiotropium 18 MCG inhalation capsule   Commonly known as: SPIRIVA   Place 18 mcg into inhaler and inhale daily.      Vitamin D 2000 UNITS Caps   Take 1 capsule by mouth daily.        Hospital Procedures: Ct Head Wo Contrast  10/23/2011  *RADIOLOGY REPORT*  Clinical Data: Left-sided arm numbness since last evening.  3 days of nausea, vomiting diarrhea.  CT HEAD WITHOUT CONTRAST  Technique:  Contiguous axial images were obtained from the base of the skull through the vertex without contrast.  Comparison: 04/25/2011.  Findings: No intracranial hemorrhage.  Prominent small vessel disease type changes and remote infarcts without CT evidence of large acute infarct.  No significant change in the appearance of the nonspecific calcification  in the posterior left temporal lobe.  Vascular calcifications.  Global atrophy without hydrocephalus.  No intracranial mass lesion detected on this unenhanced exam.  Visualized sinuses and mastoid air cells are clear.  IMPRESSION: No intracranial hemorrhage or CT evidence of large acute infarct. Please see above.   Original Report Authenticated By: Fuller Canada, M.D.    Mr Brain Wo Contrast  10/23/2011  *RADIOLOGY REPORT*  Clinical Data: Left arm and leg numbness.  Vomiting and diarrhea.  MRI HEAD WITHOUT CONTRAST  Technique:  Multiplanar, multiecho pulse sequences of the brain and surrounding structures were obtained according to standard protocol without intravenous contrast.  Comparison: CT head 10/23/2011  Findings: Negative for acute infarct.  Generalized atrophy of a moderate degree.  Moderate chronic microvascular ischemia throughout the cerebral white matter bilaterally.  Chronic infarcts in the  cerebellum bilaterally.  Small calcification in the left temporal parietal lobe is noted on the CT.  There is no surrounding edema and this is most likely a chronic finding due to old infection or hemorrhage.  No other areas of possible hemorrhage are present.  No shift of  the midline structures.  Increased signal in the distal vertebral artery may represent occlusion.  Right vertebral artery and basilar patent.  Internal carotid artery is patent bilaterally.  Paranasal sinuses show mild mucosal edema.  IMPRESSION: Atrophy and chronic microvascular ischemia.  No acute infarct.  Possible stenosis or occlusion of the distal left vertebral artery.   Original Report Authenticated By: Camelia Phenes, M.D.     History of Present Illness & Hospital Course: Nancy Cole admitted last night for Paresthesia and numbness of L side and MRI finding of L vertebral aa occlusion. ASA started, K repleted and Nancy Cole was put in to complete a TIA w/up. The Numbness is gone. Nancy Cole denies posterior Sxs.   2D ECHO result: - Left ventricle: The cavity size was normal. Wall thickness was increased in a pattern of mild LVH. Systolic function was normal. The estimated ejection fraction was in the range of 60% to 65%. Wall motion was normal; there were no regional wall motion abnormalities. Doppler parameters are consistent with abnormal left ventricular relaxation (grade 1 diastolic dysfunction). - Atrial septum: No defect or patent foramen ovale was identified.  Carotid artery duplex and TCD completed. Right: 40-59% internal carotid artery stenosis by velocities, probably due to vessel tortuosity. The plaque morphology does not support this finding. The vertebral artery flow is antegrade. Left: No evidence of significant ICA stenosis. Vertebral artery flow is not insonated with duplex imaging but is antegrade with Transcranial Doppler.  Nancy Cole had a Tmax 100.5 but not febrile currently. WBC 7.4 without L Shift.  UA is (-) x for yeast.  Nancy Cole  denies URI Sxs and CXR not needed.  Will monitor for any infection.  *Numbness/Paraesthesias are better and ?Possible TIA? Vert aa occlusion on MRA did not pan out on Transcranial Dopplers.  No posterior Sxs noted anyway. - Continue ASA, but can be Baby ASA - tele and EKG ruled out AFib  - currently all symptoms have resolved   COPD  - cont inhalers   Hypothyroid  - cont synthroid   Allergies  - cont home meds   DVT Prophylaxis was provided  Hyponatremia and Hypokalemia - better.  Nancy Cole can go home on her home meds + baby ASA     Day of Discharge Exam BP 119/43  Pulse 100  Temp 100.5 Cole (38.1 C) (Oral)  Resp 18  Ht  5\' 1"  (1.549 m)  Wt 69.9 kg (154 lb 1.6 oz)  BMI 29.12 kg/m2  SpO2 93%   Discharge Labs:  [facility] 10/24/11 0608 10/23/11 1235  NA 137 134*  K 3.7 3.1*  CL 105 99  CO2 25 25  GLUCOSE 88 97  BUN 10 12  CREATININE 0.68 0.72  CALCIUM 9.1 9.8  MG -- --  PHOS -- --   No results found for this basename: AST:2,ALT:2,ALKPHOS:2,BILITOT:2,PROT:2,ALBUMIN:2 in the last 72 hours  Basename 10/24/11 0608 10/23/11 1235  WBC 7.4 8.0  NEUTROABS -- 4.9  HGB 12.9 13.9  HCT 39.0 40.7  MCV 88.8 87.9  PLT 240 290   No results found for this basename: CKTOTAL:3,CKMB:3,CKMBINDEX:3,TROPONINI:3 in the last 72 hours No results found for this basename: TSH,T4TOTAL,FREET3,T3FREE,THYROIDAB in the last 72 hours No results found for this basename: VITAMINB12:2,FOLATE:2,FERRITIN:2,TIBC:2,IRON:2,RETICCTPCT:2 in the last 72 hours No results found for this basename: INR, PROTIME       Discharge instructions:     Discharge Orders    Future Appointments: Provider: Department: Dept Phone: Center:   11/08/2011 10:45 AM Waymon Budge, MD [facility] [phone] None     06-Home-Health Care Svc    Disposition: Home  Follow-up Appts: Follow-up with Dr. Timothy Lasso at [facility] in 1-2 weeks.  Pt to Call for appointment.  Condition on  Discharge:Stable  Tests Needing Follow-up: None.  Time spent in discharge (includes decision making & examination of pt): 20 min  Signed: Telina Kleckley M 10/24/2011, 11:41 AM

## 2011-10-24 NOTE — Progress Notes (Signed)
Patient given D/C instructions and stroke education. All questions answered to patient's satisfaction. Pt D/C with no signs of acute distress.

## 2011-10-24 NOTE — Progress Notes (Addendum)
Carotid artery duplex and TCD completed.  Right:  40-59% internal carotid artery stenosis by velocities, probably due to vessel tortuosity.  The plaque morphology does not support this finding.  The vertebral artery flow is antegrade.  Left:  No evidence of significant ICA stenosis.  Vertebral artery flow is not insonated with duplex imaging but is antegrade with Transcranial Doppler.

## 2011-10-24 NOTE — OR Nursing (Signed)
Bubble study per protocol preformed twice during a trans thoracic ultrasound study through a left hand IV.

## 2011-10-24 NOTE — Progress Notes (Signed)
   CARE MANAGEMENT NOTE 10/24/2011  Patient:  Nancy Cole, Nancy Cole   Account Number:  [id number]  Date Initiated:  10/24/2011  Documentation initiated by:  [facility]  Subjective/Objective Assessment:   Admitted with lt sided numbness and tingling     Action/Plan:   TIA workup   Anticipated DC Date:  10/25/2011   Anticipated DC Plan:  HOME/SELF CARE      DC Planning Services  CM consult      Choice offered to / List presented to:             Status of service:  Completed, signed off Medicare Important Message given?   (If response is "NO", the following Medicare IM given date fields will be blank) Date Medicare IM given:   Date Additional Medicare IM given:    Discharge Disposition:  HOME/SELF CARE  Per UR Regulation:  Reviewed for med. necessity/level of care/duration of stay  If discussed at Long Length of Stay Meetings, dates discussed:    Comments:  10/24/2011 1400 No NCM needs identified. Isidoro Donning RN CCM Case Mgmt phone [phone]

## 2011-10-24 NOTE — Progress Notes (Signed)
Subjective: 57 F admitted last night for Paresthesia and numbness of L side and MRI finding of L vertebral aa occlusion.. ASA started, K repleted and she was put in to complete a TIA w/up.  The Numbness is gone.  She denies posterior Sxs.    Objective: Vital signs in last 24 hours: Temp:  [97.5 F (36.4 C)-98.2 F (36.8 C)] 98.1 F (36.7 C) (10/16 0410) Pulse Rate:  [57-79] 70  (10/16 0410) Resp:  [12-22] 20  (10/16 0410) BP: (129-154)/(56-94) 140/82 mmHg (10/16 0410) SpO2:  [95 %-100 %] 96 % (10/16 0410) Weight:  [69.9 kg (154 lb 1.6 oz)] 69.9 kg (154 lb 1.6 oz) (10/15 2327) Weight change:     CBG (last 3)  No results found for this basename: GLUCAP:3 in the last 72 hours  Intake/Output from previous day: No intake or output data in the 24 hours ending 10/24/11 0809     Physical Exam  General appearance: Alert and oriented Eyes: no scleral icterus Throat: oropharynx moist without erythema Resp: CTA Cardio: Reg Carotids s bruit GI: soft, non-tender; bowel sounds normal; no masses,  no organomegaly Extremities: no clubbing, cyanosis or edema No Numbness, nor neuro deficits.   Lab Results:  [facility] 10/24/11 0608 10/23/11 1235  NA 137 134*  K 3.7 3.1*  CL 105 99  CO2 25 25  GLUCOSE 88 97  BUN 10 12  CREATININE 0.68 0.72  CALCIUM 9.1 9.8  MG -- --  PHOS -- --    No results found for this basename: AST:2,ALT:2,ALKPHOS:2,BILITOT:2,PROT:2,ALBUMIN:2 in the last 72 hours   Basename 10/24/11 0608 10/23/11 1235  WBC 7.4 8.0  NEUTROABS -- 4.9  HGB 12.9 13.9  HCT 39.0 40.7  MCV 88.8 87.9  PLT 240 290    No results found for this basename: INR, PROTIME    No results found for this basename: CKTOTAL:3,CKMB:3,CKMBINDEX:3,TROPONINI:3 in the last 72 hours  No results found for this basename: TSH,T4TOTAL,FREET3,T3FREE,THYROIDAB in the last 72 hours  No results found for this basename: VITAMINB12:2,FOLATE:2,FERRITIN:2,TIBC:2,IRON:2,RETICCTPCT:2 in the last 72  hours  Micro Results: No results found for this or any previous visit (from the past 240 hour(s)).   Studies/Results: Ct Head Wo Contrast  10/23/2011  *RADIOLOGY REPORT*  Clinical Data: Left-sided arm numbness since last evening.  3 days of nausea, vomiting diarrhea.  CT HEAD WITHOUT CONTRAST  Technique:  Contiguous axial images were obtained from the base of the skull through the vertex without contrast.  Comparison: 04/25/2011.  Findings: No intracranial hemorrhage.  Prominent small vessel disease type changes and remote infarcts without CT evidence of large acute infarct.  No significant change in the appearance of the nonspecific calcification in the posterior left temporal lobe.  Vascular calcifications.  Global atrophy without hydrocephalus.  No intracranial mass lesion detected on this unenhanced exam.  Visualized sinuses and mastoid air cells are clear.  IMPRESSION: No intracranial hemorrhage or CT evidence of large acute infarct. Please see above.   Original Report Authenticated By: Fuller Canada, M.D.    Mr Brain Wo Contrast  10/23/2011  *RADIOLOGY REPORT*  Clinical Data: Left arm and leg numbness.  Vomiting and diarrhea.  MRI HEAD WITHOUT CONTRAST  Technique:  Multiplanar, multiecho pulse sequences of the brain and surrounding structures were obtained according to standard protocol without intravenous contrast.  Comparison: CT head 10/23/2011  Findings: Negative for acute infarct.  Generalized atrophy of a moderate degree.  Moderate chronic microvascular ischemia throughout the cerebral white matter bilaterally.  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  Small calcification in the left temporal parietal lobe is noted on the CT.  There is no surrounding edema and this is most likely a chronic finding due to old infection or hemorrhage.  No other areas of possible hemorrhage are present.  No shift of  the midline structures.  Increased signal in the distal vertebral artery may  represent occlusion.  Right vertebral artery and basilar patent.  Internal carotid artery is patent bilaterally.  Paranasal sinuses show mild mucosal edema.  IMPRESSION: Atrophy and chronic microvascular ischemia.  No acute infarct.  Possible stenosis or occlusion of the distal left vertebral artery.   Original Report Authenticated By: Camelia Phenes, M.D.      Medications: Scheduled:   . aspirin EC  325 mg Oral Daily  . docusate sodium  100 mg Oral BID  . levothyroxine  100 mcg Oral QAC breakfast  . loratadine  10 mg Oral Daily  . montelukast  10 mg Oral QHS  . pantoprazole  40 mg Oral Daily  . potassium chloride  10 mEq Intravenous Once  . potassium chloride  20 mEq Oral BID  . sertraline  100 mg Oral Daily  . sodium chloride  1,000 mL Intravenous Once  . sodium chloride  3 mL Intravenous Q12H  . tiotropium  18 mcg Inhalation Daily  . DISCONTD: sodium chloride  3 mL Intravenous Q12H   Continuous:   . DISCONTD: sodium chloride 125 mL/hr at 10/23/11 4098     Assessment/Plan: Principal Problem:  *Numbness  Paraesthesia  - c Possible TIA? Vert aa occlusion should cause posterior Sxs of which she does not have.   - there is possible occlusion of vertebral artery on MRI  - Continue ASA  - get TTE w/ bubble, transcranial and carotid dopplars ordered. - tele  - currently all symptoms have resolved  COPD  - cont inhalers   Hypothyroid  - cont synthroid   Allergies  - cont home meds   DVT Prophylaxis  Hyponatremia and Hypokalemia - better.   LOS: 1 day   Macyn Shropshire M 10/24/2011, 8:09 AM

## 2011-10-28 NOTE — ED Provider Notes (Signed)
Medical screening examination/treatment/procedure(s) were performed by non-physician practitioner and as supervising physician I was immediately available for consultation/collaboration.  Toy Baker, MD 10/28/11 1011

## 2011-11-08 ENCOUNTER — Ambulatory Visit: Payer: Medicare Other | Admitting: Internal Medicine

## 2011-12-11 ENCOUNTER — Encounter: Payer: Self-pay | Admitting: Internal Medicine

## 2011-12-11 ENCOUNTER — Ambulatory Visit (INDEPENDENT_AMBULATORY_CARE_PROVIDER_SITE_OTHER): Payer: Medicare Other | Admitting: Internal Medicine

## 2011-12-11 VITALS — BP 120/82 | HR 83 | Ht 61.0 in | Wt 153.0 lb

## 2011-12-11 DIAGNOSIS — G4733 Obstructive sleep apnea (adult) (pediatric): Secondary | ICD-10-CM

## 2011-12-11 DIAGNOSIS — J449 Chronic obstructive pulmonary disease, unspecified: Secondary | ICD-10-CM

## 2011-12-11 NOTE — Progress Notes (Signed)
Patient ID: Nancy Cole, female    DOB: March 04, 1934, 77 y.o.   MRN: [id number]  HPI 63 yoF never smoker, followed here for asthma/ fixed obstruction, OSA and last seen January 13, 2010 for surgical clearance before hernia surgery, which went very well. CPAP 9 was dropped off after surgery. It began bothering her last year- couldn't tolerate anything on her face. She didn't want to bother, and the pressure felt wrong. I discussed recent study finding dementia in untreated older women with untreated OSA and she is willing to restart.  Heavy pollen is causing watery eyes and nose.   10/13/10- 76 yoF never smoker, followed here for asthma/ fixed obstruction, OSA She does not recognize any seasonal changes as the fall weather has come in. Nasalcrom nasal spray did help. CPAP auto titration was done and we are searching for the download report. She is using it all night every night and feels comfortably rested during the daytime.  12/11/11- 77 yoF never smoker, followed here for asthma/ fixed obstruction, OSA FOLLOWS FOR:dry cough x weeks; denies any congesiton, SOB, or wheezing Hospitalized in April with urinary infection and dehydration. Hospital in October to rule out stroke/negative. No breathing problems through all of this and now feels well. Easy dyspnea on exertion blamed on deconditioning. Doing rehabilitation.  she stopped bothering with CPAP months ago. Her DME [facility] has told her she is due for a new machine. Question of a broken switch. CXR 04/26/11-reviewed IMPRESSION:  No pulmonary edema. Persistent streaky bilateral basilar  atelectasis or infiltrate.  Original Report Authenticated By: Natasha Mead, M.D.   Review of Systems-See HPI Constitutional:   No-   weight loss, night sweats, fevers, chills, fatigue, lassitude. HEENT:   No-  headaches, difficulty swallowing, tooth/dental problems, sore throat,       No-  sneezing, itching, ear ache, nasal congestion, post nasal drip,  CV:  No-    chest pain, orthopnea, PND, swelling in lower extremities, anasarca, dizziness, palpitations Resp: + shortness of breath with exertion or at rest.              No-   productive cough,  No non-productive cough,  No-  coughing up of blood.              No-   change in color of mucus.  No- wheezing.   Skin: No-   rash or lesions. GI:  No-   heartburn, indigestion, abdominal pain, nausea, vomiting,  GU: . MS:  No-   joint pain or swelling.   Neuro- nothing unusual/note hospital workup  Psych:  No- change in mood or affect. No depression or anxiety.  No memory loss.  Objective:   Physical Exam General- Alert, Oriented, Affect-appropriate, Distress- none acute. Elderly Skin- rash-none, lesions- none, excoriation- none Lymphadenopathy- none Head- atraumatic            Eyes- Gross vision intact, PERRLA, conjunctivae clear secretions            Ears- Hearing, canals-normal            Nose- Clear, no-Septal dev, mucus, polyps, erosion, perforation             Throat- Mallampati III , mucosa clear , drainage- none, tonsils- atrophic Neck- flexible , trachea midline, no stridor , thyroid nl, carotid no bruit Chest - symmetrical excursion , unlabored           Heart/CV- RRR , no murmur , no gallop  , no rub,  nl s1 s2                           - JVD- none , edema- none, stasis changes- none, varices- none           Lung- clear to P&A, wheeze- none, cough- none , dullness-none, rub- none           Chest wall-  Abd- Br/ Gen/ Rectal- Not done, not indicated Extrem- cyanosis- none, clubbing, none, atrophy- none, strength- nl Neuro- grossly intact to observation Assessment & Plan:

## 2011-12-11 NOTE — Patient Instructions (Addendum)
Order-  DME [facility] replacement CPAP autotitrate 5-15 cwp   Old machine worn out    Mask of choice, supplies, humidifier     Dx OSA

## 2011-12-12 ENCOUNTER — Telehealth: Payer: Self-pay | Admitting: Internal Medicine

## 2011-12-12 MED ORDER — MONTELUKAST SODIUM 10 MG PO TABS: 10.0000 mg | ORAL_TABLET | Freq: Every day | ORAL | Status: DC

## 2011-12-12 NOTE — Telephone Encounter (Signed)
Rx has been sent to [facility], pt aware.

## 2011-12-22 NOTE — Assessment & Plan Note (Signed)
Obstructive airways disease attributed to chronic fixed asthma and a never smoker. Generally controlled with Spiriva plus albuterol rescue inhaler.

## 2012-01-22 ENCOUNTER — Ambulatory Visit: Payer: Medicare Other | Admitting: Internal Medicine

## 2012-02-15 ENCOUNTER — Ambulatory Visit (INDEPENDENT_AMBULATORY_CARE_PROVIDER_SITE_OTHER)
Admission: RE | Admit: 2012-02-15 | Discharge: 2012-02-15 | Disposition: A | Discharge status: Home / Self Care | Payer: Medicare Other | Source: Ambulatory Visit | Attending: Internal Medicine | Admitting: Internal Medicine

## 2012-02-15 ENCOUNTER — Encounter: Payer: Self-pay | Admitting: Internal Medicine

## 2012-02-15 ENCOUNTER — Ambulatory Visit (INDEPENDENT_AMBULATORY_CARE_PROVIDER_SITE_OTHER): Payer: Medicare Other | Admitting: Internal Medicine

## 2012-02-15 VITALS — BP 118/82 | HR 78 | Ht 61.0 in | Wt 155.8 lb

## 2012-02-15 DIAGNOSIS — G4733 Obstructive sleep apnea (adult) (pediatric): Secondary | ICD-10-CM

## 2012-02-15 DIAGNOSIS — J449 Chronic obstructive pulmonary disease, unspecified: Secondary | ICD-10-CM

## 2012-02-15 DIAGNOSIS — J4489 Other specified chronic obstructive pulmonary disease: Secondary | ICD-10-CM

## 2012-02-15 DIAGNOSIS — J45909 Unspecified asthma, uncomplicated: Secondary | ICD-10-CM

## 2012-02-15 NOTE — Progress Notes (Signed)
Patient ID: Nancy Cole, female    DOB: May 31, 1934, 78 y.o.   MRN: [id number]  HPI 22 yoF never smoker, followed here for asthma/ fixed obstruction, OSA and last seen January 13, 2010 for surgical clearance before hernia surgery, which went very well. CPAP 9 was dropped off after surgery. It began bothering her last year- couldn't tolerate anything on her face. She didn't want to bother, and the pressure felt wrong. I discussed recent study finding dementia in untreated older women with untreated OSA and she is willing to restart.  Heavy pollen is causing watery eyes and nose.   10/13/10- 76 yoF never smoker, followed here for asthma/ fixed obstruction, OSA She does not recognize any seasonal changes as the fall weather has come in. Nasalcrom nasal spray did help. CPAP auto titration was done and we are searching for the download report. She is using it all night every night and feels comfortably rested during the daytime.  12/11/11- 77 yoF never smoker, followed here for asthma/ fixed obstruction, OSA FOLLOWS FOR:dry cough x weeks; denies any congesiton, SOB, or wheezing Hospitalized in April with urinary infection and dehydration. Hospital in October to rule out stroke/negative. No breathing problems through all of this and now feels well. Easy dyspnea on exertion blamed on deconditioning. Doing rehabilitation.  she stopped bothering with CPAP months ago. Her DME [facility] Allegra has told her she is due for a new machine. Question of a broken switch. CXR 04/26/11-reviewed IMPRESSION:  No pulmonary edema. Persistent streaky bilateral basilar  atelectasis or infiltrate.  Original Report Authenticated By: Natasha Mead, M.D.    02/15/12- 80 yoF never smoker, followed here for asthma/ fixed obstruction, OSA FOLLOWS FOR: states she has been more shortwinded today due to rushing. No real change in her breathing "feels fine". She went back to using her CPAP 9/[facility] every night. A brother who smokes has been  diagnosed with lung cancer. Another brother is followed here.  Review of Systems-See HPI Constitutional:   No-   weight loss, night sweats, fevers, chills, fatigue, lassitude. HEENT:   No-  headaches, difficulty swallowing, tooth/dental problems, sore throat,       No-  sneezing, itching, ear ache, nasal congestion, post nasal drip,  CV:  No-   chest pain, orthopnea, PND, swelling in lower extremities, anasarca, dizziness, palpitations Resp: + shortness of breath with exertion or at rest.              No-   productive cough,  No non-productive cough,  No-  coughing up of blood.              No-   change in color of mucus.  No- wheezing.   Skin: No-   rash or lesions. GI:  No-   heartburn, indigestion, abdominal pain, nausea, vomiting,  GU: . MS:  No-   joint pain or swelling.   Neuro- nothing unusual/note hospital workup  Psych:  No- change in mood or affect. No depression or anxiety.  No memory loss.  Objective:   Physical Exam General- Alert, Oriented, Affect-appropriate, Distress- none acute. Elderly. Looks tired. Skin- rash-none, lesions- none, excoriation- none Lymphadenopathy- none Head- atraumatic            Eyes- Gross vision intact, PERRLA, conjunctivae clear secretions            Ears- Hearing, canals-normal            Nose- Clear, no-Septal dev, mucus, polyps, erosion, perforation  Throat- Mallampati III , mucosa clear , drainage- none, tonsils- atrophic Neck- flexible , trachea midline, no stridor , thyroid nl, carotid no bruit Chest - symmetrical excursion , unlabored           Heart/CV- RRR , no murmur , no gallop  , no rub, nl s1 s2                           - JVD- none , edema- none, stasis changes- none, varices- none           Lung- clear to P&A, wheeze- none, cough- none , dullness-none, rub- none           Chest wall-  Abd- Br/ Gen/ Rectal- Not done, not indicated Extrem- cyanosis- none, clubbing, none, atrophy- none, strength- nl Neuro- grossly  intact to observation Assessment & Plan:

## 2012-02-15 NOTE — Patient Instructions (Addendum)
We can continue CPAP 9/ [facility]  Order- CXR  Dx asthma

## 2012-02-20 ENCOUNTER — Encounter: Payer: Self-pay | Admitting: *Deleted

## 2012-02-20 NOTE — Progress Notes (Signed)
Quick Note:  Pt aware of results. ______ 

## 2012-02-23 NOTE — Assessment & Plan Note (Addendum)
Stable dyspnea with exertion, clinically unchanged Plan-chest x-ray

## 2012-02-23 NOTE — Assessment & Plan Note (Signed)
She has resumed good compliance and control with CPAP 9. No change necessary.

## 2012-05-29 ENCOUNTER — Other Ambulatory Visit: Payer: Self-pay | Admitting: *Deleted

## 2012-05-29 MED ORDER — MONTELUKAST SODIUM 10 MG PO TABS: 10.0000 mg | ORAL_TABLET | Freq: Every day | ORAL | Status: DC

## 2012-08-14 ENCOUNTER — Ambulatory Visit (INDEPENDENT_AMBULATORY_CARE_PROVIDER_SITE_OTHER): Payer: Medicare Other | Admitting: Internal Medicine

## 2012-08-14 ENCOUNTER — Encounter: Payer: Self-pay | Admitting: Internal Medicine

## 2012-08-14 VITALS — BP 126/82 | HR 87 | Ht 61.0 in | Wt 163.6 lb

## 2012-08-14 DIAGNOSIS — J449 Chronic obstructive pulmonary disease, unspecified: Secondary | ICD-10-CM

## 2012-08-14 DIAGNOSIS — G4733 Obstructive sleep apnea (adult) (pediatric): Secondary | ICD-10-CM

## 2012-08-14 MED ORDER — AZELASTINE-FLUTICASONE 137-50 MCG/ACT NA SUSP: 1.0000 | Freq: Every day | NASAL | Status: DC

## 2012-08-14 NOTE — Patient Instructions (Signed)
Sample Dymista nasal spray    1-2 puffs each nostril once daily at bedtime  Work with [facility]ra to improve mask fit, since your current mask is pretty new.

## 2012-08-14 NOTE — Progress Notes (Signed)
Patient ID: Nancy Cole, female    DOB: 1934-09-16, 78 y.o.   MRN: [id number]  HPI 52 yoF never smoker, followed here for asthma/ fixed obstruction, OSA and last seen January 13, 2010 for surgical clearance before hernia surgery, which went very well. CPAP 9 was dropped off after surgery. It began bothering her last year- couldn't tolerate anything on her face. She didn't want to bother, and the pressure felt wrong. I discussed recent study finding dementia in untreated older women with untreated OSA and she is willing to restart.  Heavy pollen is causing watery eyes and nose.   10/13/10- 76 yoF never smoker, followed here for asthma/ fixed obstruction, OSA She does not recognize any seasonal changes as the fall weather has come in. Nasalcrom nasal spray did help. CPAP auto titration was done and we are searching for the download report. She is using it all night every night and feels comfortably rested during the daytime.  12/11/11- 77 yoF never smoker, followed here for asthma/ fixed obstruction, OSA FOLLOWS FOR:dry cough x weeks; denies any congesiton, SOB, or wheezing Hospitalized in April with urinary infection and dehydration. Hospital in October to rule out stroke/negative. No breathing problems through all of this and now feels well. Easy dyspnea on exertion blamed on deconditioning. Doing rehabilitation.  she stopped bothering with CPAP months ago. Her DME [facility] has told her she is due for a new machine. Question of a broken switch. CXR 04/26/11-reviewed IMPRESSION:  No pulmonary edema. Persistent streaky bilateral basilar  atelectasis or infiltrate.  Original Report Authenticated By: Natasha Mead, M.D.    02/15/12- 63 yoF never smoker, followed here for asthma/ fixed obstruction, OSA FOLLOWS FOR: states she has been more shortwinded today due to rushing. No real change in her breathing "feels fine". She went back to using her CPAP 9/[facility] every night. A brother who smokes has been  diagnosed with lung cancer. Another brother is followed here.  08/14/12- 78 yoF never smoker, followed here for asthma/ fixed obstruction, OSA 6 month follow up.  Tries to wear cpap every night for approx 7-8 hrs.  Mask is uncomforatble and slides down.  Breathing has been much better overall since last OV.  Some wheezing with humid weather, some discomfort in chest, and dry cough. CPAP 9/ [facility] . Sleep has been more restless so the current mask seems to come off more easily  CXR 02/15/12 IMPRESSION:  Stable chronic lung disease. Stable cardiomegaly.  Original Report Authenticated By: Erskine Speed, M.D.  Review of Systems-See HPI Constitutional:   No-   weight loss, night sweats, fevers, chills, +fatigue, lassitude. HEENT:   No-  headaches, difficulty swallowing, tooth/dental problems, sore throat,       No-  sneezing, itching, ear ache, nasal congestion, post nasal drip,  CV:  No-   chest pain, orthopnea, PND, swelling in lower extremities, anasarca, dizziness, palpitations Resp: + shortness of breath with exertion or at rest.              No-   productive cough,  No non-productive cough,  No-  coughing up of blood.              No-   change in color of mucus.  No- wheezing.   Skin: No-   rash or lesions. GI:  No-   heartburn, indigestion, abdominal pain, nausea, vomiting,  GU: . MS:  No-   joint pain or swelling.   Neuro- nothing unusual/note hospital workup  Psych:  No- change in mood or affect. No depression or anxiety.  No memory loss.  Objective:   Physical Exam General- Alert, Oriented, Affect-appropriate, Distress- none acute. Elderly. Looks tired. Skin- rash-none, lesions- none, excoriation- none Lymphadenopathy- none Head- atraumatic            Eyes- Gross vision intact, PERRLA, conjunctivae clear secretions            Ears- Hearing, canals-normal            Nose- Clear, no-Septal dev, mucus, polyps, erosion, perforation             Throat- Mallampati III , mucosa clear ,  drainage- none, tonsils- atrophic Neck- flexible , trachea midline, no stridor , thyroid nl, carotid no bruit Chest - symmetrical excursion , unlabored           Heart/CV- RRR , no murmur , no gallop  , no rub, nl s1 s2                           - JVD- none , edema- none, stasis changes- none, varices- none           Lung- clear to P&A, wheeze- none, cough- none , dullness-none, rub- none           Chest wall-  Abd- Br/ Gen/ Rectal- Not done, not indicated Extrem- cyanosis- none, clubbing, none, atrophy- none, strength- nl Neuro- grossly intact to observation Assessment & Plan:

## 2012-08-26 NOTE — Assessment & Plan Note (Signed)
Plan-continue current pressure, working with [facility] for mask.

## 2012-08-26 NOTE — Assessment & Plan Note (Signed)
She is comfortable for now no changes planned

## 2012-10-01 ENCOUNTER — Telehealth: Payer: Self-pay | Admitting: Internal Medicine

## 2012-10-01 NOTE — Telephone Encounter (Signed)
I spoke with pt. She is wanting to know if Dr. Maple Hudson is okay with her getting the regular flu shot? Her insurance does not cover the higher dose flu shot but will pay for this if Dr. Maple Hudson feels she needs this. Pt aware he is out this afternoon. Please advise Dr. Maple Hudson thanks

## 2012-10-01 NOTE — Telephone Encounter (Signed)
Regular flu shot should be adequate- that's what I got.

## 2012-10-02 NOTE — Telephone Encounter (Signed)
Spoke with patient; aware of recommendations from CY.

## 2013-02-17 ENCOUNTER — Ambulatory Visit: Payer: Medicare Other | Admitting: Internal Medicine

## 2013-03-19 ENCOUNTER — Telehealth: Payer: Self-pay | Admitting: Internal Medicine

## 2013-03-19 NOTE — Telephone Encounter (Signed)
Attempted to call patient- no answer and no voicemail. Pt can have 1 time 90 day supply sent to pharmacy and needs to make and KEEP OV with CY.

## 2013-03-20 MED ORDER — MONTELUKAST SODIUM 10 MG PO TABS: 10.0000 mg | ORAL_TABLET | Freq: Every day | ORAL | Status: DC

## 2013-03-20 NOTE — Telephone Encounter (Signed)
Pt aware and rx sent 

## 2013-05-15 ENCOUNTER — Other Ambulatory Visit: Payer: Self-pay | Admitting: Internal Medicine

## 2013-06-22 ENCOUNTER — Other Ambulatory Visit: Payer: Self-pay | Admitting: Internal Medicine

## 2013-06-23 ENCOUNTER — Other Ambulatory Visit: Payer: Self-pay | Admitting: Internal Medicine

## 2013-06-23 DIAGNOSIS — Z1231 Encounter for screening mammogram for malignant neoplasm of breast: Secondary | ICD-10-CM

## 2013-07-07 ENCOUNTER — Ambulatory Visit
Admission: RE | Admit: 2013-07-07 | Discharge: 2013-07-07 | Disposition: A | Discharge status: Home / Self Care | Payer: Commercial Managed Care - HMO | Source: Ambulatory Visit | Attending: Internal Medicine | Admitting: Internal Medicine

## 2013-07-07 DIAGNOSIS — Z1231 Encounter for screening mammogram for malignant neoplasm of breast: Secondary | ICD-10-CM

## 2013-07-14 ENCOUNTER — Other Ambulatory Visit: Payer: Self-pay | Admitting: Internal Medicine

## 2013-07-14 DIAGNOSIS — R928 Other abnormal and inconclusive findings on diagnostic imaging of breast: Secondary | ICD-10-CM

## 2013-07-22 ENCOUNTER — Ambulatory Visit
Admission: RE | Admit: 2013-07-22 | Discharge: 2013-07-22 | Disposition: A | Discharge status: Home / Self Care | Payer: Commercial Managed Care - HMO | Source: Ambulatory Visit | Attending: Internal Medicine | Admitting: Internal Medicine

## 2013-07-22 ENCOUNTER — Encounter (INDEPENDENT_AMBULATORY_CARE_PROVIDER_SITE_OTHER): Payer: Self-pay

## 2013-07-22 DIAGNOSIS — R928 Other abnormal and inconclusive findings on diagnostic imaging of breast: Secondary | ICD-10-CM

## 2013-11-13 ENCOUNTER — Emergency Department (HOSPITAL_COMMUNITY)
Admission: EM | Admit: 2013-11-13 | Discharge: 2013-11-13 | Disposition: A | Discharge status: Home / Self Care | Payer: Medicare HMO | Attending: Emergency Medicine | Admitting: Emergency Medicine

## 2013-11-13 ENCOUNTER — Encounter (HOSPITAL_COMMUNITY): Payer: Self-pay | Admitting: *Deleted

## 2013-11-13 DIAGNOSIS — J449 Chronic obstructive pulmonary disease, unspecified: Secondary | ICD-10-CM | POA: Diagnosis not present

## 2013-11-13 DIAGNOSIS — Z8739 Personal history of other diseases of the musculoskeletal system and connective tissue: Secondary | ICD-10-CM | POA: Diagnosis not present

## 2013-11-13 DIAGNOSIS — Z880 Allergy status to penicillin: Secondary | ICD-10-CM | POA: Diagnosis not present

## 2013-11-13 DIAGNOSIS — Z8719 Personal history of other diseases of the digestive system: Secondary | ICD-10-CM | POA: Insufficient documentation

## 2013-11-13 DIAGNOSIS — M1990 Unspecified osteoarthritis, unspecified site: Secondary | ICD-10-CM | POA: Diagnosis not present

## 2013-11-13 DIAGNOSIS — M71572 Other bursitis, not elsewhere classified, left ankle and foot: Secondary | ICD-10-CM | POA: Diagnosis not present

## 2013-11-13 DIAGNOSIS — Z79899 Other long term (current) drug therapy: Secondary | ICD-10-CM | POA: Diagnosis not present

## 2013-11-13 DIAGNOSIS — Z7982 Long term (current) use of aspirin: Secondary | ICD-10-CM | POA: Insufficient documentation

## 2013-11-13 DIAGNOSIS — L03116 Cellulitis of left lower limb: Secondary | ICD-10-CM | POA: Diagnosis present

## 2013-11-13 DIAGNOSIS — M25572 Pain in left ankle and joints of left foot: Secondary | ICD-10-CM | POA: Diagnosis not present

## 2013-11-13 LAB — COMPREHENSIVE METABOLIC PANEL
ALK PHOS: 66 U/L (ref 39–117)
ALT: 16 U/L (ref 0–35)
AST: 20 U/L (ref 0–37)
Albumin: 4.2 g/dL (ref 3.5–5.2)
Anion gap: 12 (ref 5–15)
BUN: 23 mg/dL (ref 6–23)
CO2: 25 mEq/L (ref 19–32)
Calcium: 10.3 mg/dL (ref 8.4–10.5)
Chloride: 101 mEq/L (ref 96–112)
Creatinine, Ser: 0.77 mg/dL (ref 0.50–1.10)
GFR, EST NON AFRICAN AMERICAN: 79 mL/min — AB (ref >90)
GLUCOSE: 93 mg/dL (ref 70–99)
POTASSIUM: 4.7 meq/L (ref 3.7–5.3)
Sodium: 138 mEq/L (ref 137–147)
Total Bilirubin: 0.3 mg/dL (ref 0.3–1.2)
Total Protein: 7.4 g/dL (ref 6.0–8.3)

## 2013-11-13 LAB — CBC WITH DIFFERENTIAL/PLATELET
Basophils Absolute: 0.1 10*3/uL (ref 0.0–0.1)
Basophils Relative: 1 % (ref 0–1)
EOS PCT: 2 % (ref 0–5)
Eosinophils Absolute: 0.1 10*3/uL (ref 0.0–0.7)
HCT: 42.4 % (ref 36.0–46.0)
HEMOGLOBIN: 14 g/dL (ref 12.0–15.0)
LYMPHS ABS: 2.2 10*3/uL (ref 0.7–4.0)
Lymphocytes Relative: 26 % (ref 12–46)
MCH: 30.5 pg (ref 26.0–34.0)
MCHC: 33 g/dL (ref 30.0–36.0)
MCV: 92.4 fL (ref 78.0–100.0)
MONOS PCT: 7 % (ref 3–12)
Monocytes Absolute: 0.5 10*3/uL (ref 0.1–1.0)
NEUTROS PCT: 64 % (ref 43–77)
Neutro Abs: 5.5 10*3/uL (ref 1.7–7.7)
Platelets: 287 10*3/uL (ref 150–400)
RBC: 4.59 MIL/uL (ref 3.87–5.11)
RDW: 13.4 % (ref 11.5–15.5)
WBC: 8.4 10*3/uL (ref 4.0–10.5)

## 2013-11-13 MED ORDER — HYDROCODONE-ACETAMINOPHEN 5-325 MG PO TABS
1.0000 | ORAL_TABLET | Freq: Once | ORAL | Status: AC
  Administered 2013-11-13: 1 via ORAL
  Filled 2013-11-13: qty 1

## 2013-11-13 MED ORDER — PREDNISONE 20 MG PO TABS
60.0000 mg | ORAL_TABLET | Freq: Once | ORAL | Status: AC
  Administered 2013-11-13: 60 mg via ORAL
  Filled 2013-11-13: qty 3

## 2013-11-13 MED ORDER — PREDNISONE 20 MG PO TABS
ORAL_TABLET | ORAL | Status: DC
Start: 2013-11-13 — End: 2014-08-31

## 2013-11-13 MED ORDER — HYDROCODONE-ACETAMINOPHEN 5-325 MG PO TABS: 1.0000 | ORAL_TABLET | ORAL | Status: DC | PRN

## 2013-11-13 NOTE — ED Notes (Signed)
The  Pt is c/o lt lower leg cellulitis she has had for 2 weeks.  She has been taking erythromycin just finished her 10th day  Dose today.  She feels like her leg is nit getting any better and she still has severe leg pain when she stands

## 2013-11-13 NOTE — Discharge Instructions (Signed)
Read the information below.  Use the prescribed medication as directed.  Please discuss all new medications with your pharmacist.  Do not take additional tylenol while taking the prescribed pain medication to avoid overdose.  You may return to the Emergency Department at any time for worsening condition or any new symptoms that concern you.  If you develop uncontrolled pain, weakness or numbness of the extremity, severe discoloration of the skin, or you are unable to walk or move your ankle, return to the ER for a recheck.     Elevate your foot and put heat on it several times daily.  You will be seen for an ultrasound at [place] morning (11/7).  Please follow up with Dr Alvan Dame for a recheck.     Bursitis Bursitis is a swelling and soreness (inflammation) of a fluid-filled sac (bursa) that overlies and protects a joint. It can be caused by injury, overuse of the joint, arthritis or infection. The joints most likely to be affected are the elbows, shoulders, hips and knees. HOME CARE INSTRUCTIONS   Apply ice to the affected area for 15-20 minutes each hour while awake for 2 days. Put the ice in a plastic bag and place a towel between the bag of ice and your skin.  Rest the injured joint as much as possible, but continue to put the joint through a full range of motion, 4 times per day. (The shoulder joint especially becomes rapidly "frozen" if not used.) When the pain lessens, begin normal slow movements and usual activities.  Only take over-the-counter or prescription medicines for pain, discomfort or fever as directed by your caregiver.  Your caregiver may recommend draining the bursa and injecting medicine into the bursa. This may help the healing process.  Follow all instructions for follow-up with your caregiver. This includes any orthopedic referrals, physical therapy and rehabilitation. Any delay in obtaining necessary care could result in a delay or failure of the bursitis to heal and  chronic pain. SEEK IMMEDIATE MEDICAL CARE IF:   Your pain increases even during treatment.  You develop an oral temperature above 102 F (38.9 C) and have heat and inflammation over the involved bursa. MAKE SURE YOU:   Understand these instructions.  Will watch your condition.  Will get help right away if you are not doing well or get worse. Document Released: 12/23/1999 Document Revised: 03/19/2011 Document Reviewed: 03/16/2013 [facility] Patient Information 2015 [place], [place]. This information is not intended to replace advice given to you by your health care provider. Make sure you discuss any questions you have with your health care provider.  Heat Therapy Heat therapy can help ease sore, stiff, injured, and tight muscles and joints. Heat relaxes your muscles, which may help ease your pain.  RISKS AND COMPLICATIONS If you have any of the following conditions, do not use heat therapy unless your health care provider has approved:  Poor circulation.  Healing wounds or scarred skin in the area being treated.  Diabetes, heart disease, or high blood pressure.  Not being able to feel (numbness) the area being treated.  Unusual swelling of the area being treated.  Active infections.  Blood clots.  Cancer.  Inability to communicate pain. This may include young children and people who have problems with their brain function (dementia).  Pregnancy. Heat therapy should only be used on old, pre-existing, or long-lasting (chronic) injuries. Do not use heat therapy on new injuries unless directed by your health care provider. HOW TO USE HEAT  THERAPY There are several different kinds of heat therapy, including:  Moist heat pack.  Warm water bath.  Hot water bottle.  Electric heating pad.  Heated gel pack.  Heated wrap.  Electric heating pad. Use the heat therapy method suggested by your health care provider. Follow your health care provider's instructions on when and  how to use heat therapy. GENERAL HEAT THERAPY RECOMMENDATIONS  Do not sleep while using heat therapy. Only use heat therapy while you are awake.  Your skin may turn pink while using heat therapy. Do not use heat therapy if your skin turns red.  Do not use heat therapy if you have new pain.  High heat or long exposure to heat can cause burns. Be careful when using heat therapy to avoid burning your skin.  Do not use heat therapy on areas of your skin that are already irritated, such as with a rash or sunburn. SEEK MEDICAL CARE IF:  You have blisters, redness, swelling, or numbness.  You have new pain.  Your pain is worse. MAKE SURE YOU:  Understand these instructions.  Will watch your condition.  Will get help right away if you are not doing well or get worse. Document Released: 03/19/2011 Document Revised: 05/11/2013 Document Reviewed: 02/17/2013 [facility] Patient Information 2015 [place], [place]. This information is not intended to replace advice given to you by your health care provider. Make sure you discuss any questions you have with your health care provider.

## 2013-11-13 NOTE — ED Provider Notes (Signed)
CSN: [id number]     Arrival date & time 11/13/13  1718 History   First MD Initiated Contact with Patient 11/13/13 1938     Chief Complaint  Patient presents with  . Cellulitis     (Consider location/radiation/quality/duration/timing/severity/associated sxs/prior Treatment) The history is provided by the patient.     Patient presents with worsening pain of left ankle x 2 weeks.  Initially had erythema, edema, warmth, tenderness of medial left ankle, seen by Dr Alvan Dame (orthopedics) and started on doxycycline 10 days ago.  States the erythema and edema have improved but the pain is worse.  Pain has crept up her left leg.  Pain is primarily located over medial ankle, radiates up leg, worse with ambulation, pain reaches 10/10 with ambulation, currently 0/10 as improved with rest. Denies fevers, CP, SOB.  Called Dr Aurea Graff office but he was unavailable and she was advised to come to ED for further evaluation.    Past Medical History  Diagnosis Date  . COPD (chronic obstructive pulmonary disease)     PFT 2007 FEV1/FVC 43%  PFT 01/12/10 FEV1 1.0/51%, FVC 2.0/79%, FEV1/FVC 0.48  . Osteoarthritis   . DDD (degenerative disc disease)   . OSA on CPAP   . Seasonal allergies   . Asthma   . Diverticulitis    Past Surgical History  Procedure Laterality Date  . Pleural scarification  1979    recurrent spontaneuous--L--- finally had tac pleurodesis  . Appendectomy    . Cholecystectomy    . Total knee arthroplasty      L  . Partial colectomy  2009    for diverticulitis  . Cataract extraction, bilateral     Family History  Problem Relation Age of Onset  . COPD Father     heavy smoker  . Heart attack Mother   . COPD Brother     smoker   History  Substance Use Topics  . Smoking status: Never Smoker   . Smokeless tobacco: Never Used  . Alcohol Use: No   OB History    No data available     Review of Systems  All other systems reviewed and are negative.     Allergies  Penicillins and  Sulfamethoxazole  Home Medications   Prior to Admission medications   Medication Sig Start Date End Date Taking? Authorizing Provider  albuterol (PROAIR HFA) 108 (90 BASE) MCG/ACT inhaler Inhale 2 puffs into the lungs every 6 (six) hours as needed. Wheezing/shortness of breath.    Historical Provider, MD  aspirin EC 325 MG EC tablet Take 1 tablet (325 mg total) by mouth daily. 10/24/11   Precious Reel, MD  Azelastine-Fluticasone [facility]) 137-50 MCG/ACT SUSP Place 1-2 puffs into the nose at bedtime. 08/14/12   Deneise Lever, MD  cetirizine (ZYRTEC) 10 MG tablet Take 10 mg by mouth daily.     Historical Provider, MD  Cholecalciferol (VITAMIN D) 2000 UNITS CAPS Take 1 capsule by mouth daily.      Historical Provider, MD  levothyroxine (SYNTHROID, LEVOTHROID) 100 MCG tablet Take 100 mcg by mouth daily.      Historical Provider, MD  montelukast (SINGULAIR) 10 MG tablet TAKE 1 TABLET AT BEDTIME    Deneise Lever, MD  omeprazole (PRILOSEC) 20 MG capsule Take 20 mg by mouth daily.    Historical Provider, MD  potassium chloride (K-DUR,KLOR-CON) 10 MEQ tablet Take 20 mEq by mouth 2 (two) times daily. 2 tabs twice daily 09/14/10   Historical Provider, MD  sertraline (  ZOLOFT) 100 MG tablet Take 100 mg by mouth daily.    Historical Provider, MD  tiotropium (SPIRIVA) 18 MCG inhalation capsule Place 18 mcg into inhaler and inhale daily. 06/06/11   Deneise Lever, MD   BP 145/81 mmHg  Pulse 74  Temp(Src) 97.7 F (36.5 C) (Oral)  Resp 14  SpO2 99% Physical Exam  Constitutional: She appears well-developed and well-nourished. No distress.  HENT:  Head: Normocephalic and atraumatic.  Neck: Neck supple.  Cardiovascular: Normal rate and regular rhythm.   Pulmonary/Chest: Effort normal and breath sounds normal. No respiratory distress. She has no wheezes. She has no rales.  Abdominal: Soft. She exhibits no distension. There is no tenderness. There is no rebound and no guarding.  Musculoskeletal:        Feet:  Neurological: She is alert.  Skin: She is not diaphoretic.  Nursing note and vitals reviewed.   ED Course  Procedures (including critical care time) Labs Review Labs Reviewed  COMPREHENSIVE METABOLIC PANEL - Abnormal; Notable for the following:    GFR calc non Af Amer 79 (*)    All other components within normal limits  CBC WITH DIFFERENTIAL    Imaging Review No results found.   EKG Interpretation None       8:20 PM Discussed pt with Dr Eulis Foster who will also see and evaluate the patient.   Dr Eulis Foster is suspicious that this is bursitis of the ankle.  Please see his face to face encounter for full details. Recommends PO prednisone, norco.  Venous duplex tomorrow, no lovenox tonight as there is a very low suspicion for clot.  Elevate, heat, ortho follow up.   MDM   Final diagnoses:  Bursitis of ankle, left    Afebrile, nontoxic patient with pain and swelling of left ankle, treated x 10 days with doxycycline.  Pt has localized swelling and tenderness, does have very mild tenderness in her calf.  Pt feeling much better after norco, pain relieved.  Discussed safety and fall risk with narcotics. Doubt septic joint, very low suspicion for DVT.  Pt has never had gout.  Please see plan as discussed above.   D/C home with norco, prednisone, orthopedic follow up.  Venous duplex US in AM.  Discussed result, findings, treatment, and follow up  with patient.  Pt given return precautions.  Pt verbalizes understanding and agrees with plan.         Clayton Bibles, PA-C 11/13/13 [place], MD 11/16/13 [phone]

## 2013-11-13 NOTE — ED Provider Notes (Signed)
  Face-to-face evaluation   History: she complains of pain and swelling in the left medial ankle region, for 2 weeks, without improvement using doxycycline as treatment for cellulitis.  She has a follow-up appointment with her orthopedist in about 10 days.  She was concerned that since she was not getting better, that she needed to be evaluated tonight.  There has been no trauma.  She has pain in the left medial ankle that radiates to the posterior upper lower leg, when she ambulates.  She is able to ambulate, using her walker.   Physical exam:Elderly female, alert and cooperative.  She does not appear uncomfortable.  Left leg- tender, mildly indurated area, minimally erythematous, above the left medial ankle joint, over the posterior tibial tendon region.  The indurated area is almost circular, measuring about 4.5 cm. She has pain with maneuvers that shorten the posterior tibial tendon, but not with maneuvers that lengthen the posterior tibial tendon. She has minimal calf tenderness to palpation.  The left lower leg does not appear swollen as compared to the right.  There is no left popliteal tenderness or mass.  Assessment-likely bursitis, left ankle.  Doubt DVT.  Will screen for DVT, with Doppler in the morning, to ensure that there is no DVT.   Medical screening examination/treatment/procedure(s) were conducted as a shared visit with non-physician practitioner(s) and myself.  I personally evaluated the patient during the encounter  Richarda Blade, MD 11/16/13 1153

## 2013-11-14 ENCOUNTER — Ambulatory Visit (HOSPITAL_COMMUNITY)
Admission: RE | Admit: 2013-11-14 | Discharge: 2013-11-14 | Disposition: A | Discharge status: Home / Self Care | Payer: Medicare HMO | Source: Ambulatory Visit | Attending: Emergency Medicine | Admitting: Emergency Medicine

## 2013-11-14 DIAGNOSIS — M79605 Pain in left leg: Secondary | ICD-10-CM | POA: Insufficient documentation

## 2013-11-14 DIAGNOSIS — M7989 Other specified soft tissue disorders: Secondary | ICD-10-CM | POA: Insufficient documentation

## 2013-11-14 DIAGNOSIS — M79609 Pain in unspecified limb: Secondary | ICD-10-CM

## 2013-11-14 NOTE — Progress Notes (Signed)
VASCULAR LAB PRELIMINARY  PRELIMINARY  PRELIMINARY  PRELIMINARY  Left lower extremity venous Doppler completed.    Preliminary report:  There is no DVT or SVT noted in the left lower extremity.   Chanee Henrickson, RVT 11/14/2013, 8:30 AM

## 2014-01-11 DIAGNOSIS — E785 Hyperlipidemia, unspecified: Secondary | ICD-10-CM | POA: Diagnosis not present

## 2014-01-11 DIAGNOSIS — E039 Hypothyroidism, unspecified: Secondary | ICD-10-CM | POA: Diagnosis not present

## 2014-01-11 DIAGNOSIS — Z008 Encounter for other general examination: Secondary | ICD-10-CM | POA: Diagnosis not present

## 2014-01-11 DIAGNOSIS — I10 Essential (primary) hypertension: Secondary | ICD-10-CM | POA: Diagnosis not present

## 2014-02-08 DIAGNOSIS — M2011 Hallux valgus (acquired), right foot: Secondary | ICD-10-CM | POA: Diagnosis not present

## 2014-02-08 DIAGNOSIS — M205X2 Other deformities of toe(s) (acquired), left foot: Secondary | ICD-10-CM | POA: Diagnosis not present

## 2014-02-08 DIAGNOSIS — M205X1 Other deformities of toe(s) (acquired), right foot: Secondary | ICD-10-CM | POA: Diagnosis not present

## 2014-02-08 DIAGNOSIS — M66872 Spontaneous rupture of other tendons, left ankle and foot: Secondary | ICD-10-CM | POA: Diagnosis not present

## 2014-02-16 DIAGNOSIS — H3530 Unspecified macular degeneration: Secondary | ICD-10-CM | POA: Diagnosis not present

## 2014-02-16 DIAGNOSIS — I10 Essential (primary) hypertension: Secondary | ICD-10-CM | POA: Diagnosis not present

## 2014-02-16 DIAGNOSIS — J309 Allergic rhinitis, unspecified: Secondary | ICD-10-CM | POA: Diagnosis not present

## 2014-02-16 DIAGNOSIS — G459 Transient cerebral ischemic attack, unspecified: Secondary | ICD-10-CM | POA: Diagnosis not present

## 2014-02-16 DIAGNOSIS — R42 Dizziness and giddiness: Secondary | ICD-10-CM | POA: Diagnosis not present

## 2014-04-21 DIAGNOSIS — Z961 Presence of intraocular lens: Secondary | ICD-10-CM | POA: Diagnosis not present

## 2014-05-21 ENCOUNTER — Encounter: Payer: Self-pay | Admitting: Neurology

## 2014-05-21 ENCOUNTER — Ambulatory Visit (INDEPENDENT_AMBULATORY_CARE_PROVIDER_SITE_OTHER): Payer: Commercial Managed Care - HMO | Admitting: Neurology

## 2014-05-21 VITALS — BP 130/78 | HR 76 | Resp 16 | Ht 61.0 in | Wt 159.9 lb

## 2014-05-21 DIAGNOSIS — H8109 Meniere's disease, unspecified ear: Secondary | ICD-10-CM

## 2014-05-21 DIAGNOSIS — H8190 Unspecified disorder of vestibular function, unspecified ear: Secondary | ICD-10-CM

## 2014-05-21 DIAGNOSIS — F0390 Unspecified dementia without behavioral disturbance: Secondary | ICD-10-CM | POA: Diagnosis not present

## 2014-05-21 DIAGNOSIS — I679 Cerebrovascular disease, unspecified: Secondary | ICD-10-CM | POA: Insufficient documentation

## 2014-05-21 MED ORDER — DONEPEZIL HCL 5 MG PO TABS: 5.0000 mg | ORAL_TABLET | Freq: Every day | ORAL | Status: DC

## 2014-05-21 NOTE — Progress Notes (Addendum)
NEUROLOGY CONSULTATION NOTE  Nancy Cole MRN: [id number] DOB: 04/29/1934  Referring provider: Dr. Virgina Jock Primary care provider: Dr. Virgina Jock  Reason for consult:  Dizziness  HISTORY OF PRESENT ILLNESS: Nancy Cole is a 80 year old right-handed woman with asthma, COPD, hypertension, hypothyroidism, OSA, left macular degeneration and history of TIAs who presents for dizziness.  Some records, MRA reports from 2009, MRI from 2013 and labs reviewed.  She has been experiencing dizzy spells for about a year.  It is triggered with any movement of the head or change in position such as turning in bed or bending forward.  She describes a sensation of movement.  Sometimes it is spinning.  Sometimes it looks like her carpet is moving.  It lasts about 3 minutes, but has lasted up to 5-10 minutes.  It is sometimes accompanied by nausea if it lasts a while.  She also has an associated dull pressure over the top of her head.  It occurs almost daily.  She has taken meclizine.  For several years, she also describes seeing a "blue ball" in her visual field.  She never checked to see if it was only in one eye.  It would last about 10 minutes and resolve.  There was no associated headache.  It does not occur often anymore.  She has no history of migraine.  She reports problems with memory, gradually worse over the past year.  She reports difficulty remembering names of people she knows.  She also has gotten lost while driving on familiar routes.  She no longer drives.  She also started making mistakes when paying the bills.  She feels disorganized and has trouble focusing.  She denies family history of dementia.  She was evaluated by Dr. Erling Cruz in 2009 for blackouts.  At that time, she was in her car in the driveway and the next thing she knew, she was in the car after knocking down the fence in the backyard.  There was no preceding aura, tunnel vision or lightheadedness.  She was not confused when she woke up.  An  MRI of the brain performed 08/07/07 reportedly showed chronic small vessel disease in the periventricular white matter and cerebellum.  MRA of the head and neck performed on 09/09/07 showed a diminutive left vertebral artery with dominant right vertebral artery and without intracranial stenosis or carotid stenosis.    She has history of several TIAs, including one in October 2013.  She presented with left sided paresthesias and numbness.  MRI of the brain revealed no acute infarct, but again showed extensive white matter disease with chronic lacunes and probable occluded left vertebral artery.  Carotid doppler revealed 40-59% right ICA stenosis based on velocities, thought to be due to tortuosity.  Transcranial doppler was normal.  2D echo showed EF 60-65% with grade 1 diastolic dysfunction.  PAST MEDICAL HISTORY: Past Medical History  Diagnosis Date  . COPD (chronic obstructive pulmonary disease)     PFT 2007 FEV1/FVC 43%  PFT 01/12/10 FEV1 1.0/51%, FVC 2.0/79%, FEV1/FVC 0.48  . Osteoarthritis   . DDD (degenerative disc disease)   . OSA on CPAP   . Seasonal allergies   . Asthma   . Diverticulitis     PAST SURGICAL HISTORY: Past Surgical History  Procedure Laterality Date  . Pleural scarification  1979    recurrent spontaneuous--L--- finally had tac pleurodesis  . Appendectomy    . Cholecystectomy    . Total knee arthroplasty      L  .  Partial colectomy  2009    for diverticulitis  . Cataract extraction, bilateral      MEDICATIONS: Current Outpatient Prescriptions on File Prior to Visit  Medication Sig Dispense Refill  . albuterol (PROAIR HFA) 108 (90 BASE) MCG/ACT inhaler Inhale 2 puffs into the lungs daily. Take every day per patient    . aspirin 81 MG tablet Take 81 mg by mouth daily.    . cetirizine (ZYRTEC) 10 MG tablet Take 10 mg by mouth daily.     . Cholecalciferol (VITAMIN D) 2000 UNITS CAPS Take 1 capsule by mouth daily.      Marland Kitchen HYDROcodone-acetaminophen (NORCO/VICODIN)  5-325 MG per tablet Take 1 tablet by mouth every 4 (four) hours as needed for moderate pain or severe pain. 20 tablet 0  . levothyroxine (SYNTHROID, LEVOTHROID) 100 MCG tablet Take 100 mcg by mouth daily.      . montelukast (SINGULAIR) 10 MG tablet TAKE 1 TABLET AT BEDTIME 90 tablet 0  . omeprazole (PRILOSEC) 20 MG capsule Take 20 mg by mouth daily.    . potassium chloride (K-DUR,KLOR-CON) 10 MEQ tablet Take 20 mEq by mouth 2 (two) times daily. 2 tabs twice daily    . predniSONE (DELTASONE) 20 MG tablet Take one tablet PO BID x 5 days (Patient not taking: Reported on 05/21/2014) 10 tablet 0  . sertraline (ZOLOFT) 100 MG tablet Take 100 mg by mouth daily.    Marland Kitchen tiotropium (SPIRIVA) 18 MCG inhalation capsule Place 18 mcg into inhaler and inhale daily.     No current facility-administered medications on file prior to visit.    ALLERGIES: Allergies  Allergen Reactions  . Penicillins     REACTION: rash  . Sulfamethoxazole     REACTION: unspecified    FAMILY HISTORY: Family History  Problem Relation Age of Onset  . COPD Father     heavy smoker  . Heart attack Mother   . COPD Brother     smoker    SOCIAL HISTORY: History   Social History  . Marital Status: Divorced    Spouse Name: N/A  . Number of Children: N/A  . Years of Education: N/A   Occupational History  . 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 Smoking status: Never Smoker   . Smokeless tobacco: Never Used  . Alcohol Use: No  . Drug Use: No  . Sexual Activity: No   Other Topics Concern  . Not on file   Social History Narrative   Positive history of passive tobacco smoke exposure-significant exposure to parents, heavy smokers.     REVIEW OF SYSTEMS: Constitutional: No fevers, chills, or sweats, no generalized fatigue, change in appetite Eyes: No visual changes, double vision, eye pain Ear, nose and throat: No hearing loss, ear pain, nasal congestion, sore  throat Cardiovascular: No chest pain, palpitations Respiratory:  No shortness of breath at rest or with exertion, wheezes GastrointestinaI: No nausea, vomiting, diarrhea, abdominal pain, fecal incontinence Genitourinary:  No dysuria, urinary retention or frequency Musculoskeletal:  No neck pain, back pain Integumentary: No rash, pruritus, skin lesions Neurological: as above Psychiatric: No depression, insomnia, anxiety Endocrine: No palpitations, fatigue, diaphoresis, mood swings, change in appetite, change in weight, increased thirst Hematologic/Lymphatic:  No anemia, purpura, petechiae. Allergic/Immunologic: no itchy/runny eyes, nasal congestion, recent allergic reactions, rashes  PHYSICAL EXAM: Filed Vitals:   05/21/14 1415  BP: 130/78  Pulse: 76  Resp: 16   General: No acute distress Head:  Normocephalic/atraumatic Eyes:  fundi unremarkable, without vessel changes, exudates, hemorrhages or papilledema. Neck: supple, no paraspinal tenderness, full range of motion Back: No paraspinal tenderness Heart: regular rate and rhythm Lungs: Clear to auscultation bilaterally. Vascular: No carotid bruits. Neurological Exam: Mental status: alert and oriented to person, place, and time (except month), delayed recall poor, remote memory intact, fund of knowledge intact, attention and concentration intact, speech fluent and not dysarthric, language intact. MMSE - Mini Mental State Exam 05/21/2014  Orientation to time 4  Orientation to Place 4  Registration 3  Attention/ Calculation 3  Recall 0  Language- name 2 objects 2  Language- repeat 1  Language- follow 3 step command 3  Language- read & follow direction 1  Write a sentence 1  Copy design 0  Total score 22    Cranial nerves: CN I: not tested CN II: pupils equal, round and reactive to light, visual fields intact, fundi unremarkable, without vessel changes, exudates, hemorrhages or papilledema. CN III, IV, VI:  full range of  motion, no nystagmus, no ptosis CN V: facial sensation intact CN VII: upper and lower face symmetric CN VIII: hearing intact CN IX, X: gag intact, uvula midline CN XI: sternocleidomastoid and trapezius muscles intact CN XII: tongue midline Bulk & Tone: normal, no fasciculations. Motor:  5/5 throughout Sensation:  Temperature and vibration intact Deep Tendon Reflexes:  1+ in upper extremities, absent in lower extremities, toes downgoing Finger to nose testing:  No dysmetria Heel to shin:  No dysmetria Gait:  Antalgic gait.  Cannot tandem walk. Romberg with sway.  IMPRESSION: Vestibulopathy, positional vertigo. Cerebrovascular disease Dementia without behavioral changes.  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  Will start Aricept 5mg  at bedtime.  In 4 weeks, will have her call with update and increase to 10mg  at bedtime if tolerating. 2.  Will check MRI of brain to evaluate for progression of cerebrovascular disease 3.  Check b12 level and fasting lipid panel 4.  Refer for vestibular rehab 5.  On ASA 81mg  daily 6.  Follow up in 6 months.  Thank you for allowing me to take part in the care of this patient.  Metta Clines, DO  CC:  Shon Baton, MD

## 2014-05-21 NOTE — Patient Instructions (Addendum)
1.  We will start donepezil (Aricept) 5mg  daily for four weeks.  If you are tolerating the medication, then after four weeks, we will increase the dose to 10mg  daily.  Side effects include nausea, vomiting, diarrhea, vivid dreams, and muscle cramps.  Please call the clinic if you experience any of these symptoms. 2.  We will send you for vestibular rehab 3.  We will check b12 level and check fasting lipid panel 4.  Continue aspirin 81mg  daily 5.  Follow up in 6 months  6. MRI Brain without  [facility]  06/09/14 at 4:45pm

## 2014-05-24 DIAGNOSIS — F0390 Unspecified dementia without behavioral disturbance: Secondary | ICD-10-CM | POA: Diagnosis not present

## 2014-05-24 DIAGNOSIS — H8109 Meniere's disease, unspecified ear: Secondary | ICD-10-CM | POA: Diagnosis not present

## 2014-05-24 DIAGNOSIS — I679 Cerebrovascular disease, unspecified: Secondary | ICD-10-CM | POA: Diagnosis not present

## 2014-05-24 DIAGNOSIS — E785 Hyperlipidemia, unspecified: Secondary | ICD-10-CM | POA: Diagnosis not present

## 2014-05-25 LAB — VITAMIN B12: VITAMIN B 12: 881 pg/mL (ref 211–911)

## 2014-05-25 LAB — LIPID PANEL
CHOL/HDL RATIO: 3 ratio
Cholesterol: 186 mg/dL (ref 0–200)
HDL: 62 mg/dL (ref >=46)
LDL CALC: 107 mg/dL — AB (ref 0–99)
Triglycerides: 85 mg/dL (ref <150)
VLDL: 17 mg/dL (ref 0–40)

## 2014-05-26 ENCOUNTER — Telehealth: Payer: Self-pay | Admitting: *Deleted

## 2014-05-26 NOTE — Telephone Encounter (Signed)
-----   Message from Pieter Partridge, DO sent at 05/25/2014  9:27 AM EDT ----- Cholesterol looks okay except the LDL is 107.  That is a good number but with history of TIA, it should ideally be less than 100.  She can work on improving her diet to get that a little lower.  Otherwise, we can start a very low dose of simvastatin (5mg  to 10mg  daily).  She can discuss this with her PCP if she likes.

## 2014-05-26 NOTE — Telephone Encounter (Signed)
Patient is aware that Cholesterol looks okay except the LDL is 107. That is a good number but with history of TIA, it should ideally be less than 100. She can work on improving her diet to get that a little lower. Otherwise, we can start a very low dose of simvastatin (5mg  to 10mg  daily). She can discuss this with her PCP if she likes She will contact PCP and discuss and let us know what she would like to do .

## 2014-06-09 ENCOUNTER — Ambulatory Visit (HOSPITAL_COMMUNITY)
Admission: RE | Admit: 2014-06-09 | Discharge: 2014-06-09 | Disposition: A | Discharge status: Home / Self Care | Payer: Commercial Managed Care - HMO | Source: Ambulatory Visit | Attending: Neurology | Admitting: Neurology

## 2014-06-09 DIAGNOSIS — F0390 Unspecified dementia without behavioral disturbance: Secondary | ICD-10-CM

## 2014-06-09 DIAGNOSIS — R9082 White matter disease, unspecified: Secondary | ICD-10-CM | POA: Insufficient documentation

## 2014-06-09 DIAGNOSIS — I679 Cerebrovascular disease, unspecified: Secondary | ICD-10-CM | POA: Diagnosis not present

## 2014-06-09 DIAGNOSIS — I6502 Occlusion and stenosis of left vertebral artery: Secondary | ICD-10-CM | POA: Diagnosis not present

## 2014-06-09 DIAGNOSIS — G319 Degenerative disease of nervous system, unspecified: Secondary | ICD-10-CM | POA: Diagnosis not present

## 2014-06-09 DIAGNOSIS — H8190 Unspecified disorder of vestibular function, unspecified ear: Secondary | ICD-10-CM

## 2014-06-09 DIAGNOSIS — R42 Dizziness and giddiness: Secondary | ICD-10-CM | POA: Diagnosis present

## 2014-06-10 ENCOUNTER — Telehealth: Payer: Self-pay | Admitting: *Deleted

## 2014-06-10 NOTE — Telephone Encounter (Signed)
Patient is aware MRI of brain shows extensive changes in the brain related to stroke risk factors, which are contributing to cognitive deficits, but it is not significantly worse compared to prior MRI

## 2014-06-10 NOTE — Telephone Encounter (Signed)
-----   Message from Pieter Partridge, DO sent at 06/10/2014  7:12 AM EDT ----- MRI of brain shows extensive changes in the brain related to stroke risk factors, which are contributing to cognitive deficits, but it is not significantly worse compared to prior MRI

## 2014-06-14 ENCOUNTER — Telehealth: Payer: Self-pay | Admitting: Neurology

## 2014-06-14 NOTE — Telephone Encounter (Signed)
Pt called in regards to her medication Aricept/Dawn [phone]

## 2014-06-14 NOTE — Telephone Encounter (Signed)
Spoke with patient she states she has had a horrible headache daily she would like to know what she can take that would not mix with her other medication. She is not haveing any trouble with the Aricept.

## 2014-06-15 NOTE — Telephone Encounter (Signed)
Patient state she took 2 500 mg Tylenol last night and does not have a headache to day. She states she does not think it is a side effect from the Aricept. She will keep [place] posted on her headaches as she gets them

## 2014-06-15 NOTE — Telephone Encounter (Signed)
She can take ibuprofen or Tylenol.  How long has she had a headache and has she already taken anything for it?  If these headaches are new, then it may be a side effect from the Aricept.

## 2014-06-22 ENCOUNTER — Other Ambulatory Visit: Payer: Self-pay | Admitting: Neurology

## 2014-06-22 ENCOUNTER — Telehealth: Payer: Self-pay | Admitting: Neurology

## 2014-06-22 ENCOUNTER — Other Ambulatory Visit: Payer: Self-pay | Admitting: *Deleted

## 2014-06-22 MED ORDER — DONEPEZIL HCL 5 MG PO TABS: 5.0000 mg | ORAL_TABLET | Freq: Every day | ORAL | Status: DC

## 2014-06-22 NOTE — Telephone Encounter (Signed)
Pt states that she is put of the Aricept and would like [place] to call it in to the [facility] in [facility] pt phone number is [phone]

## 2014-06-22 NOTE — Telephone Encounter (Signed)
RX sent to pharmacy for Aricept

## 2014-06-30 ENCOUNTER — Telehealth: Payer: Self-pay | Admitting: Neurology

## 2014-06-30 NOTE — Telephone Encounter (Signed)
Pt called in regards to her medication Aricept/Dawn CB# [phone]

## 2014-07-05 NOTE — Telephone Encounter (Signed)
Refills had been sent to pharmacy on 06/22/14 with refills  For Aricept

## 2014-07-13 ENCOUNTER — Telehealth: Payer: Self-pay | Admitting: Neurology

## 2014-07-13 ENCOUNTER — Other Ambulatory Visit: Payer: Self-pay | Admitting: *Deleted

## 2014-07-13 DIAGNOSIS — F0280 Dementia in other diseases classified elsewhere without behavioral disturbance: Secondary | ICD-10-CM

## 2014-07-13 DIAGNOSIS — G309 Alzheimer's disease, unspecified: Principal | ICD-10-CM

## 2014-07-13 MED ORDER — DONEPEZIL HCL 5 MG PO TABS: 5.0000 mg | ORAL_TABLET | Freq: Every day | ORAL | Status: DC

## 2014-07-13 NOTE — Telephone Encounter (Signed)
Pt. Nancy Cole DOB: 2034-01-23, MR# [id number] called about a refill prescription for: Aricept (her pharmacy PH# [phone]/ her call back PH# [phone]

## 2014-07-14 NOTE — Telephone Encounter (Signed)
Patient called to let office know she got her medication

## 2014-07-20 ENCOUNTER — Telehealth: Payer: Self-pay | Admitting: Neurology

## 2014-07-20 NOTE — Telephone Encounter (Signed)
Pt stated that she is tolerating the [place] Med well, but having bad ha's/dizzyness, call back PH# [phone]

## 2014-07-20 NOTE — Telephone Encounter (Signed)
Patient states she is doing okay on Aricept  But she has had a pound severe headache for several days  I advised her to contact  Dr Virgina Jock and if it continues she should go to ER to be on the safe side she agreed she says this is a different headache

## 2014-07-22 DIAGNOSIS — I10 Essential (primary) hypertension: Secondary | ICD-10-CM | POA: Diagnosis not present

## 2014-07-22 DIAGNOSIS — H8190 Unspecified disorder of vestibular function, unspecified ear: Secondary | ICD-10-CM | POA: Diagnosis not present

## 2014-07-22 DIAGNOSIS — R51 Headache: Secondary | ICD-10-CM | POA: Diagnosis not present

## 2014-07-22 DIAGNOSIS — R42 Dizziness and giddiness: Secondary | ICD-10-CM | POA: Diagnosis not present

## 2014-07-22 DIAGNOSIS — G459 Transient cerebral ischemic attack, unspecified: Secondary | ICD-10-CM | POA: Diagnosis not present

## 2014-07-22 DIAGNOSIS — E039 Hypothyroidism, unspecified: Secondary | ICD-10-CM | POA: Diagnosis not present

## 2014-07-22 DIAGNOSIS — Z6829 Body mass index (BMI) 29.0-29.9, adult: Secondary | ICD-10-CM | POA: Diagnosis not present

## 2014-07-22 DIAGNOSIS — F0390 Unspecified dementia without behavioral disturbance: Secondary | ICD-10-CM | POA: Diagnosis not present

## 2014-08-20 DIAGNOSIS — I10 Essential (primary) hypertension: Secondary | ICD-10-CM | POA: Diagnosis not present

## 2014-08-20 DIAGNOSIS — Z8679 Personal history of other diseases of the circulatory system: Secondary | ICD-10-CM | POA: Diagnosis not present

## 2014-08-20 DIAGNOSIS — E785 Hyperlipidemia, unspecified: Secondary | ICD-10-CM | POA: Diagnosis not present

## 2014-08-20 DIAGNOSIS — F0390 Unspecified dementia without behavioral disturbance: Secondary | ICD-10-CM | POA: Diagnosis not present

## 2014-08-20 DIAGNOSIS — H8190 Unspecified disorder of vestibular function, unspecified ear: Secondary | ICD-10-CM | POA: Diagnosis not present

## 2014-08-20 DIAGNOSIS — M1990 Unspecified osteoarthritis, unspecified site: Secondary | ICD-10-CM | POA: Diagnosis not present

## 2014-08-20 DIAGNOSIS — R51 Headache: Secondary | ICD-10-CM | POA: Diagnosis not present

## 2014-08-20 DIAGNOSIS — F3341 Major depressive disorder, recurrent, in partial remission: Secondary | ICD-10-CM | POA: Diagnosis not present

## 2014-08-31 ENCOUNTER — Encounter: Payer: Self-pay | Admitting: Internal Medicine

## 2014-08-31 ENCOUNTER — Ambulatory Visit (INDEPENDENT_AMBULATORY_CARE_PROVIDER_SITE_OTHER): Payer: Commercial Managed Care - HMO | Admitting: Internal Medicine

## 2014-08-31 VITALS — BP 114/62 | HR 86 | Ht 61.0 in | Wt 158.4 lb

## 2014-08-31 DIAGNOSIS — B370 Candidal stomatitis: Secondary | ICD-10-CM | POA: Diagnosis not present

## 2014-08-31 DIAGNOSIS — B3781 Candidal esophagitis: Secondary | ICD-10-CM | POA: Diagnosis not present

## 2014-08-31 DIAGNOSIS — G4733 Obstructive sleep apnea (adult) (pediatric): Secondary | ICD-10-CM

## 2014-08-31 DIAGNOSIS — J449 Chronic obstructive pulmonary disease, unspecified: Secondary | ICD-10-CM | POA: Diagnosis not present

## 2014-08-31 MED ORDER — FLUCONAZOLE 150 MG PO TABS: 150.0000 mg | ORAL_TABLET | Freq: Every day | ORAL | Status: DC

## 2014-08-31 NOTE — Progress Notes (Signed)
Patient ID: Nancy Cole, female    DOB: 04-16-1934, 80 y.o.   MRN: [id number]  HPI 39 yoF never smoker, followed here for asthma/ fixed obstruction, OSA and last seen January 13, 2010 for surgical clearance before hernia surgery, which went very well. CPAP 9 was dropped off after surgery. It began bothering her last year- couldn't tolerate anything on her face. She didn't want to bother, and the pressure felt wrong. I discussed recent study finding dementia in untreated older women with untreated OSA and she is willing to restart.  Heavy pollen is causing watery eyes and nose.   10/13/10- 76 yoF never smoker, followed here for asthma/ fixed obstruction, OSA She does not recognize any seasonal changes as the fall weather has come in. Nasalcrom nasal spray did help. CPAP auto titration was done and we are searching for the download report. She is using it all night every night and feels comfortably rested during the daytime.  12/11/11- 25 yoF never smoker, followed here for asthma/ fixed obstruction, OSA FOLLOWS FOR:dry cough x weeks; denies any congesiton, SOB, or wheezing Hospitalized in April with urinary infection and dehydration. Hospital in October to rule out stroke/negative. No breathing problems through all of this and now feels well. Easy dyspnea on exertion blamed on deconditioning. Doing rehabilitation.  she stopped bothering with CPAP months ago. Her DME [facility] has told her she is due for a new machine. Question of a broken switch. CXR 04/26/11-reviewed IMPRESSION:  No pulmonary edema. Persistent streaky bilateral basilar  atelectasis or infiltrate.  Original Report Authenticated By: Lahoma Crocker, M.D.    02/15/12- 25 yoF never smoker, followed here for asthma/ fixed obstruction, OSA FOLLOWS FOR: states she has been more shortwinded today due to rushing. No real change in her breathing "feels fine". She went back to using her CPAP 9/[facility] every night. A brother who smokes has been  diagnosed with lung cancer. Another brother is followed here.  08/14/12- 78 yoF never smoker, followed here for asthma/ fixed obstruction, OSA 6 month follow up.  Tries to wear cpap every night for approx 7-8 hrs.  Mask is uncomforatble and slides down.  Breathing has been much better overall since last OV.  Some wheezing with humid weather, some discomfort in chest, and dry cough. CPAP 9/ [facility] . Sleep has been more restless so the current mask seems to come off more easily  CXR 02/15/12 IMPRESSION:  Stable chronic lung disease. Stable cardiomegaly.  Original Report Authenticated By: Roselyn Reef, M.D.  08/31/14- 78 yoF never smoker, followed here for asthma/ fixed obstruction, OSA CPAP 9/ [facility] FOLLOWS FOR: Per Dr Virgina Jock; has not worn CPAP for most of the year-has not felt well. Dr Virgina Jock wonders if Headaches could be coming from not usuing the CPAP. She felt badly "no energy, just couldn't bother" and stopped CPAP almost a year ago. She is had neurologic workup for headache and memory loss/dementia with small vessel changes on imaging. She recently started back on CPAP, mentions some mask discomfort, but admits she sleeps better wearing CPAP. Her machine works okay. Breathing has been stable without acute events or wheeze. Frequent dry cough. She dropped off Singulair and Spiriva, using rescue inhaler occasionally.  Review of Systems-See HPI Constitutional:   No-   weight loss, night sweats, fevers, chills, +fatigue, lassitude. HEENT:   No-  headaches, difficulty swallowing, tooth/dental problems, sore throat,       No-  sneezing, itching, ear ache, nasal congestion, post nasal drip,  CV:  No-   chest pain, orthopnea, PND, swelling in lower extremities, anasarca, dizziness, palpitations Resp: + shortness of breath with exertion or at rest.              No-   productive cough,  No non-productive cough,  No-  coughing up of blood.              No-   change in color of mucus.  No- wheezing.   Skin:  No-   rash or lesions. GI:  No-   heartburn, indigestion, abdominal pain, nausea, vomiting,  GU: . MS:  No-   joint pain or swelling.   Neuro- nothing unusual/note hospital workup  Psych:  No- change in mood or affect. No depression or anxiety.  No memory loss.  Objective:   Physical Exam General- Alert, Oriented, Affect-appropriate, Distress- none acute. Elderly.  Skin- rash-none, lesions- none, excoriation- none Lymphadenopathy- none Head- atraumatic            Eyes- Gross vision intact, PERRLA, conjunctivae clear secretions            Ears- Hearing, canals-normal            Nose- Clear, no-Septal dev, mucus, polyps, erosion, perforation             Throat- Mallampati III , mucosa + thrush , drainage- none, tonsils- atrophic, + partial plate  Neck- flexible , trachea midline, no stridor , thyroid nl, carotid no bruit Chest - symmetrical excursion , unlabored           Heart/CV- RRR , no murmur , no gallop  , no rub, nl s1 s2                           - JVD- none , edema- none, stasis changes- none, varices- none           Lung- clear to P&A, wheeze- none, cough- none , dullness-none, rub- none           Chest wall-  Abd- Br/ Gen/ Rectal- Not done, not indicated Extrem- cyanosis- none, clubbing, none, atrophy- none, strength- nl Neuro- grossly intact to observation Assessment & Plan:

## 2014-08-31 NOTE — Assessment & Plan Note (Signed)
She is not on steroid therapy and does not think she is diabetic and there is considerable coating on the tongue that looks like extensive thrush. Plan-Diflucan limited course. If this problem recurs then she will need further assessment by her primary physician.

## 2014-08-31 NOTE — Patient Instructions (Signed)
Order- DME [facility]  autotitrate CPAP 5-15, set up AirView if available, review mask comfort- mask of choice    Dx OSA   Our goal with CPAP is for you to use it whenever you are sleeping- all night, every night  Script for Diflucan

## 2014-08-31 NOTE — Assessment & Plan Note (Signed)
Well-controlled without complication. She is only using occasional rescue inhaler. She mentions dry cough but did not cough during this examination even with deep breath and chest exam was clear and unremarkable. Plan-she will keep a rescue inhaler available and watch status of this problem and season changes.

## 2014-08-31 NOTE — Assessment & Plan Note (Signed)
Sounds as if she was depressed, but for whatever reason she simply stopped bothering with CPAP. There is question of whether that is contributing to her headaches. She only restarted CPAP in the last few days. Plan-[facility] to auto titrate for pressure evaluation and download for compliance. If we can get her comfortable with her mask. We discussed possibility that she could be fitted with an oral appliance as an alternative to CPAP.

## 2014-09-14 DIAGNOSIS — G4733 Obstructive sleep apnea (adult) (pediatric): Secondary | ICD-10-CM | POA: Diagnosis not present

## 2014-11-02 ENCOUNTER — Other Ambulatory Visit: Payer: Self-pay | Admitting: Internal Medicine

## 2014-11-02 NOTE — Telephone Encounter (Signed)
Ok to refill as requested 

## 2014-11-02 NOTE — Telephone Encounter (Signed)
Diflucan last refilled 08/31/14 #5 x 1 refill Please advise Dr. Annamaria Boots thanks

## 2014-11-05 DIAGNOSIS — G4733 Obstructive sleep apnea (adult) (pediatric): Secondary | ICD-10-CM | POA: Diagnosis not present

## 2014-11-22 ENCOUNTER — Encounter: Payer: Self-pay | Admitting: Neurology

## 2014-11-22 ENCOUNTER — Ambulatory Visit (INDEPENDENT_AMBULATORY_CARE_PROVIDER_SITE_OTHER): Payer: Commercial Managed Care - HMO | Admitting: Neurology

## 2014-11-22 VITALS — BP 130/76 | HR 76 | Wt 160.0 lb

## 2014-11-22 DIAGNOSIS — I779 Disorder of arteries and arterioles, unspecified: Secondary | ICD-10-CM | POA: Diagnosis not present

## 2014-11-22 DIAGNOSIS — I739 Peripheral vascular disease, unspecified: Secondary | ICD-10-CM

## 2014-11-22 DIAGNOSIS — G3184 Mild cognitive impairment, so stated: Secondary | ICD-10-CM | POA: Diagnosis not present

## 2014-11-22 DIAGNOSIS — I679 Cerebrovascular disease, unspecified: Secondary | ICD-10-CM

## 2014-11-22 DIAGNOSIS — H8109 Meniere's disease, unspecified ear: Secondary | ICD-10-CM | POA: Diagnosis not present

## 2014-11-22 DIAGNOSIS — H8190 Unspecified disorder of vestibular function, unspecified ear: Secondary | ICD-10-CM

## 2014-11-22 MED ORDER — DONEPEZIL HCL 10 MG PO TABS: 10.0000 mg | ORAL_TABLET | Freq: Every day | ORAL | Status: DC

## 2014-11-22 NOTE — Progress Notes (Signed)
Carotid Doppler scheduled for Friday 11/18 @ 1245. Pt will also need to get Fasting Lipid done at our office.

## 2014-11-22 NOTE — Patient Instructions (Signed)
1.  Continue Aricept 10mg  daily 2.  Continue aspirin 81mg  daily 3.  Check fasting lipid panel.  Will have to start a cholesterol medication 4.  Will send to vestibular rehab 5.  Follow up in 6 months.

## 2014-11-22 NOTE — Progress Notes (Signed)
NEUROLOGY FOLLOW UP OFFICE NOTE  CAMBRE RANN XS:[id number]  HISTORY OF PRESENT ILLNESS: Nancy Cole is a 80 year old right-handed woman with asthma, COPD, hypertension, hypothyroidism, OSA, left macular degeneration and history of TIAs who follows up for vestibulopathy, dementia and cerebrovascular disease.  She is accompanied by her sister-in-law who provides some history.  Labs and images of brain MRI reviewed.  UPDATE: MRI of brain without contrast from 06/09/14 showed diffuse atrophy and small vessel ischemic changes, including remote lacunar infarcts in both basal ganglia and cerebellum, as well as possible occlusion of left vertebral artery.  B12 was 881.  Lipid panel showed LDL of 107.  She didn't discuss statin therapy with her PCP.  She was also started on Aricept and currently is doing well on 10mg  daily.  HISTORY: She has been experiencing dizzy spells for about a year.  It is triggered with any movement of the head or change in position such as turning in bed or bending forward.  She describes a sensation of movement.  Sometimes it is spinning.  Sometimes it looks like her carpet is moving.  It lasts about 3 minutes, but has lasted up to 5-10 minutes.  It is sometimes accompanied by nausea if it lasts a while.  She also has an associated dull pressure over the top of her head.  It occurs almost daily.  She has taken meclizine.  For several years, she also describes seeing a "blue ball" in her visual field.  She never checked to see if it was only in one eye.  It would last about 10 minutes and resolve.  There was no associated headache.  It does not occur often anymore.  She has no history of migraine.  She reports problems with memory, gradually worse over the past year.  She reports difficulty remembering names of people she knows.  She also has gotten lost while driving on familiar routes.  She no longer drives.  She also started making mistakes when paying the bills.  She feels  disorganized and has trouble focusing.  She denies family history of dementia.  She was evaluated by Dr. Erling Cruz in 2009 for blackouts.  At that time, she was in her car in the driveway and the next thing she knew, she was in the car after knocking down the fence in the backyard.  There was no preceding aura, tunnel vision or lightheadedness.  She was not confused when she woke up.  An MRI of the brain performed 08/07/07 reportedly showed chronic small vessel disease in the periventricular white matter and cerebellum.  MRA of the head and neck performed on 09/09/07 showed a diminutive left vertebral artery with dominant right vertebral artery and without intracranial stenosis or carotid stenosis.    She has history of several TIAs, including one in October 2013.  She presented with left sided paresthesias and numbness.  MRI of the brain revealed no acute infarct, but again showed extensive white matter disease with chronic lacunes and probable occluded left vertebral artery.  Carotid doppler revealed 40-59% right ICA stenosis based on velocities, thought to be due to tortuosity.  Transcranial doppler was normal.  2D echo showed EF 60-65% with grade 1 diastolic dysfunction.  PAST MEDICAL HISTORY: Past Medical History  Diagnosis Date  . COPD (chronic obstructive pulmonary disease) ([place])     PFT 2007 FEV1/FVC 43%  PFT 01/12/10 FEV1 1.0/51%, FVC 2.0/79%, FEV1/FVC 0.48  . Osteoarthritis   . DDD (degenerative disc disease)   .  OSA on CPAP   . Seasonal allergies   . Asthma   . Diverticulitis     MEDICATIONS: Current Outpatient Prescriptions on File Prior to Visit  Medication Sig Dispense Refill  . albuterol (PROAIR HFA) 108 (90 BASE) MCG/ACT inhaler Inhale 2 puffs into the lungs daily. Take every day per patient    . aspirin 81 MG tablet Take 81 mg by mouth daily.    . cetirizine (ZYRTEC) 10 MG tablet Take 10 mg by mouth daily.     . Cholecalciferol (VITAMIN D) 2000 UNITS CAPS Take 1 capsule by mouth  daily.      Marland Kitchen donepezil (ARICEPT) 10 MG tablet Take 10 mg by mouth at bedtime. Hold until 09-04-14 then resume per Dr Virgina Jock  3  . fluconazole (DIFLUCAN) 150 MG tablet TAKE 1 TABLET (150 MG TOTAL) BY MOUTH DAILY. 5 tablet 1  . levothyroxine (SYNTHROID, LEVOTHROID) 100 MCG tablet Take 100 mcg by mouth daily.      . montelukast (SINGULAIR) 10 MG tablet TAKE 1 TABLET AT BEDTIME 90 tablet 0  . omeprazole (PRILOSEC) 20 MG capsule Take 20 mg by mouth daily.    . potassium chloride (K-DUR,KLOR-CON) 10 MEQ tablet Take 20 mEq by mouth 2 (two) times daily. 2 tabs twice daily    . sertraline (ZOLOFT) 100 MG tablet Take 100 mg by mouth daily.    Marland Kitchen tiotropium (SPIRIVA) 18 MCG inhalation capsule Place 18 mcg into inhaler and inhale daily.     No current facility-administered medications on file prior to visit.    ALLERGIES: Allergies  Allergen Reactions  . Penicillins     REACTION: rash  . Sulfamethoxazole     REACTION: unspecified    FAMILY HISTORY: Family History  Problem Relation Age of Onset  . COPD Father     heavy smoker  . Heart attack Mother   . COPD Brother     smoker    SOCIAL HISTORY: Social History   Social History  . Marital Status: Divorced    Spouse Name: N/A  . Number of Children: N/A  . Years of Education: N/A   Occupational History  . Clinical Microbiologist     Retired   Social History Main Topics  . Smoking status: Never Smoker   . Smokeless tobacco: Never Used  . Alcohol Use: No  . Drug Use: No  . Sexual Activity: No   Other Topics Concern  . Not on file   Social History Narrative   Positive history of passive tobacco smoke exposure-significant exposure to parents, heavy smokers.     REVIEW OF SYSTEMS: Constitutional: No fevers, chills, or sweats, no generalized fatigue, change in appetite Eyes: No visual changes, double vision, eye pain Ear, nose and throat: No hearing loss, ear pain, nasal congestion, sore throat Cardiovascular: No  chest pain, palpitations Respiratory:  No shortness of breath at rest or with exertion, wheezes GastrointestinaI: No nausea, vomiting, diarrhea, abdominal pain, fecal incontinence Genitourinary:  No dysuria, urinary retention or frequency Musculoskeletal:  No neck pain, back pain Integumentary: No rash, pruritus, skin lesions Neurological: as above Psychiatric: No depression, insomnia, anxiety Endocrine: No palpitations, fatigue, diaphoresis, mood swings, change in appetite, change in weight, increased thirst Hematologic/Lymphatic:  No anemia, purpura, petechiae. Allergic/Immunologic: no itchy/runny eyes, nasal congestion, recent allergic reactions, rashes  PHYSICAL EXAM: Filed Vitals:   11/22/14 1342  BP: 130/76  Pulse: 76   General: No acute distress.  Patient appears well-groomed.   Head:  Normocephalic/atraumatic Eyes:  Fundoscopic  exam unremarkable without vessel changes, exudates, hemorrhages or papilledema. Neck: supple, no paraspinal tenderness, full range of motion Heart:  Regular rate and rhythm Lungs:  Clear to auscultation bilaterally Back: No paraspinal tenderness Neurological Exam: alert and oriented to person, place, and time. Attention span and concentration intact, recent and remote memory intact, fund of knowledge intact.  Speech fluent and not dysarthric, language intact.   MMSE - Mini Mental State Exam 11/22/2014 05/21/2014  Orientation to time 5 4  Orientation to Place 5 4  Registration 3 3  Attention/ Calculation 5 3  Recall 2 0  Language- name 2 objects 2 2  Language- repeat 1 1  Language- follow 3 step command 3 3  Language- read & follow direction 1 1  Write a sentence 1 1  Copy design 1 0  Total score 29 22   CN II-XII intact. Fundoscopic exam unremarkable without vessel changes, exudates, hemorrhages or papilledema.  Bulk and tone normal, muscle strength 5/5 throughout.  Sensation to light touch intact.  Deep tendon reflexes 2+ throughout, toes  downgoing.  Finger to nose testing intact.  Gait antalgic due to back pain.  IMPRESSION: Cerebrovascular disease Mild cognitive impairment, possibly vascular versus neurodegenerative, stable Central vestibulopathy, likely vascular Carotid artery disease  PLAN: 1.  ASA 81mg  daily and Aricept 10mg  daily 2.  Recheck fasting lipid panel to establish new baseline.  Will definitely need to start statin 3.  Refer to vestibular rehab 4.  Carotid doppler 5.  Follow up in 6 months.  Metta Clines, DO  CC:  Shon Baton, MD

## 2014-11-22 NOTE — Progress Notes (Signed)
Chart forwarded.  

## 2014-11-26 ENCOUNTER — Telehealth: Payer: Self-pay

## 2014-11-26 ENCOUNTER — Ambulatory Visit
Admission: RE | Admit: 2014-11-26 | Discharge: 2014-11-26 | Disposition: A | Discharge status: Home / Self Care | Payer: Commercial Managed Care - HMO | Source: Ambulatory Visit | Attending: Neurology | Admitting: Neurology

## 2014-11-26 DIAGNOSIS — I6523 Occlusion and stenosis of bilateral carotid arteries: Secondary | ICD-10-CM | POA: Diagnosis not present

## 2014-11-26 DIAGNOSIS — I679 Cerebrovascular disease, unspecified: Secondary | ICD-10-CM

## 2014-11-26 NOTE — Telephone Encounter (Signed)
Message relayed to patient. Verbalized understanding and denied questions.   

## 2014-11-26 NOTE — Telephone Encounter (Signed)
-----   Message from Pieter Partridge, DO sent at 11/26/2014  2:53 PM EST ----- Doppler shows known left vertebral artery occlusion (as seen on MRI) but the carotid arteries look okay.  No change in  Management.

## 2014-11-29 ENCOUNTER — Encounter: Payer: Self-pay | Admitting: Physical Therapy

## 2014-11-29 ENCOUNTER — Ambulatory Visit: Payer: Commercial Managed Care - HMO | Attending: Neurology | Admitting: Physical Therapy

## 2014-11-29 DIAGNOSIS — R42 Dizziness and giddiness: Secondary | ICD-10-CM | POA: Diagnosis not present

## 2014-11-29 DIAGNOSIS — R2681 Unsteadiness on feet: Secondary | ICD-10-CM | POA: Diagnosis not present

## 2014-11-29 DIAGNOSIS — R269 Unspecified abnormalities of gait and mobility: Secondary | ICD-10-CM | POA: Diagnosis not present

## 2014-11-29 NOTE — Therapy (Signed)
[facility] [address] [place], [place], [address] Phone: [phone]   Fax:  [phone]  Physical Therapy Evaluation  Patient Details  Name: Nancy Cole MRN: [id number] Date of Birth: [birth year] Referring Provider: Metta Clines, DO  Encounter Date: 11/29/2014      PT End of Session - 11/29/14 2121    Visit Number 1   Number of Visits 9   Date for PT Re-Evaluation 01/13/15   Authorization Type [employer]; G Codes   PT Start Time [id number]   PT Stop Time 1404   PT Time Calculation (min) 49 min   Activity Tolerance Other (comment);Patient limited by fatigue  Limited by shortness of breath   Behavior During Therapy [facility] for tasks assessed/performed      Past Medical History  Diagnosis Date  . COPD (chronic obstructive pulmonary disease) ([place])     PFT 2007 FEV1/FVC 43%  PFT 01/12/10 FEV1 1.0/51%, FVC 2.0/79%, FEV1/FVC 0.48  . Osteoarthritis   . DDD (degenerative disc disease)   . OSA on CPAP   . Seasonal allergies   . Asthma   . Diverticulitis     Past Surgical History  Procedure Laterality Date  . Pleural scarification  1979    recurrent spontaneuous--L--- finally had tac pleurodesis  . Appendectomy    . Cholecystectomy    . Total knee arthroplasty      L  . Partial colectomy  2009    for diverticulitis  . Cataract extraction, bilateral      Filed Vitals:   11/29/14 1335  SpO2: 89%    Visit Diagnosis:  Dizziness and giddiness - Plan: PT plan of care cert/re-cert  Abnormality of gait - Plan: PT plan of care cert/re-cert  Unsteadiness - Plan: PT plan of care cert/re-cert      Subjective Assessment - 11/29/14 1320    Subjective "I was referred to you 6 months ago for dizziness. The dizziness has mostly gone away. After I was put on Aricept for dementia and I think I had dizziness just because I wasn't used to that medicine yet. Can we just go through therapy for my balance instead of dizziness? My  balance is horrible." Pt denies falls but reports, "I've almost fallen a time or two."   Pertinent History Goes by "Nancy Cole". COPD,asthma, OA, DDD, HTN,L macular degeneration, history of TIA's. MRI of brain without contrast from 06/09/14 showed diffuse atrophy and small vessel ischemic changes, including remote lacunar infarcts in both basal ganglia and cerebellum, as well as possible occlusion of left vertebral artery.    Patient Stated Goals "I'd just like to be able to walk better and get around."   Currently in Pain? No/denies            [facility] PT Assessment - 11/29/14 0001    Assessment   Medical Diagnosis vestibulopathy   Referring Provider Metta Clines, DO   Onset Date/Surgical Date 05/21/14   Precautions   Precautions Fall   Restrictions   Weight Bearing Restrictions No   Balance Screen   Has the patient fallen in the past 6 months No   Has the patient had a decrease in activity level because of a fear of falling?  Yes   Is the patient reluctant to leave their home because of a fear of falling?  Yes   [place]   Living Arrangements Alone   Available Help at Discharge Family  brother and sister-in-law live  closeby and drive pt to appts   Type of [place] to enter   Entrance Stairs-Number of Steps 3   Entrance Stairs-Rails Can reach both   Home Layout One level   [place] - single point;Walker - 2 wheels   Prior Function   Level of Independence Independent            Vestibular Assessment - 11/29/14 0001    Symptom Behavior   Type of Dizziness Imbalance   Aggravating Factors Activity in general   Relieving Factors Rest   Occulomotor Exam   Occulomotor Alignment Normal  Resting head tilt to R side   Spontaneous Absent   Gaze-induced Absent   Smooth Pursuits Saccades   Saccades Poor trajectory  Undershoots with superior > inferior > R saccades   Vestibulo-Occular Reflex   VOR 1 Head  Only (x 1 viewing) Horizontal: L eye loses target occasionally; accompanied by pt-reported dizziness. Vertical: L eye loses target consistently with inferior head movement.   VOR Cancellation Comment  Limited by pt difficulty following multi-step commands   Positional Testing   Sidelying Test Sidelying Right;Sidelying Left   Sidelying Right   Sidelying Right Duration NA   Sidelying Right Symptoms No nystagmus   Sidelying Left   Sidelying Left Duration Pt reports "my head just feels a little bit full."   Sidelying Left Symptoms No nystagmus   Positional Sensitivities   Supine to Left Side Lightheadedness   Supine to Right Side No dizziness   Rolling Right No dizziness   Rolling Left No dizziness               [facility] Adult PT Treatment/Exercise - 11/29/14 0001    Bed Mobility   Bed Mobility Right Sidelying to Sit;Left Sidelying to Sit   Right Sidelying to Sit 6: Modified independent (Device/Increase time)   Left Sidelying to Sit 6: Modified independent (Device/Increase time)   Transfers   Transfers Sit to Stand;Stand to Sit   Sit to Stand 5: Supervision   Stand to Sit 5: Supervision   Ambulation/Gait   Ambulation/Gait Yes   Ambulation/Gait Assistance 5: Supervision   Ambulation Distance (Feet) 115 Feet  then 200'   Assistive device None   Gait Pattern Step-through pattern;Decreased stride length;Decreased hip/knee flexion - right;Decreased hip/knee flexion - left;Right foot flat;Left foot flat;Scissoring;Narrow base of support  intermittent LE scissoring   Ambulation Surface Level;Indoor   Gait velocity 1.8 ft/sec   Stairs Yes   Stairs Assistance 5: Supervision   Stair Management Technique Two rails;Step to pattern;Backwards   Number of Stairs 4   Height of Stairs 6   Standardized Balance Assessment   Standardized Balance Assessment Dynamic Gait Index   Dynamic Gait Index   Level Surface Mild Impairment   Change in Gait Speed Mild Impairment   Gait with Horizontal  Head Turns Moderate Impairment   Gait with Vertical Head Turns Mild Impairment   Gait and Pivot Turn Mild Impairment   Step Over Obstacle Moderate Impairment   Step Around Obstacles Moderate Impairment   Steps Moderate Impairment   Total Score 12                PT Education - 11/29/14 2117    Education provided Yes   Education Details PT eval goals, findings, and POC. Recommending family member accompany pt to PT sessions to promote carryover.   Person(s) Educated Patient;Other (comment)  sister-in-law, Mickey   Methods Explanation  Comprehension Verbalized understanding          PT Short Term Goals - 2014-12-01 Apr 12, 2130    PT SHORT TERM GOAL #1   Title Pt will perform home exercises with use of paper handout and with assistance of family member to maximize functional gains made in PT. Target date: 12/20/14   PT SHORT TERM GOAL #2   Title Pt will improve DGI score from 12 to > / = 14/24 to indicate significant improvement in dynamic gait stability. Target date: 12/20/14   PT SHORT TERM GOAL #3   Title Pt will improve gait velocity from 1.8 ft/sec to > / = 2.1 ft/sec to indicate significant improvement in efficiency of ambulation.Target date: 12/20/14   PT SHORT TERM GOAL #4   Title Determine appropriate assistive device to increase stability/independence with functional mobility. Target date: 12/20/14   PT SHORT TERM GOAL #5   Title --   Additional Short Term Goals   Additional Short Term Goals --   PT SHORT TERM GOAL #6   Title --           PT Long Term Goals - Dec 01, 2014 Apr 12, 2130    PT LONG TERM GOAL #1   Title Pt will improve DGI score from 12 to > / = 16/24 to indicate significant improvement in dynamic gait stability. Target date: 01/10/15   PT LONG TERM GOAL #2   Title Pt will improve gait velocity from 1.8 ft/sec to > / = 2.4 ft/sec to indicate significant improvement in efficiency of ambulation. Target date: 01/10/15   PT LONG TERM GOAL #3   Title Pt will ambulate  300' over level, indoor surfaces with mod I using LRAD to indicate safety with household ambulation Target date: 01/10/15   PT LONG TERM GOAL #4   Title Pt will negotiate standard ramp and curb step with supervision using LRAD to indicate pt safety traversing community obstacles. Target date: 01/10/15               Plan - 12/01/2014 11-Apr-2121    Clinical Impression Statement Pt is a pleasant 80 y/o F referred to outpatient neuro PT to address functional impairments associated with central vestibulopathy. PT evaluation reveals the following: gait impairments; DGI score suggestive of fall risk; gait speed indicative of status of limited community ambulator; abnormal smooth pursuits and saccades with accompanying disequilibrium. Pt will benefit from skilled outpatient PT 2x/week for up to 6 weeks to address said impairments.   Pt will benefit from skilled therapeutic intervention in order to improve on the following deficits Decreased knowledge of use of DME;Abnormal gait;Decreased cognition;Cardiopulmonary status limiting activity;Decreased balance;Dizziness;Impaired vision/preception;Decreased strength   Rehab Potential Good   Clinical Impairments Affecting Rehab Potential cognitive impairments   PT Frequency 2x / week   PT Duration 6 weeks   PT Treatment/Interventions ADLs/Self Care Home Management;Canalith Repostioning;DME Instruction;Gait training;Stair training;Functional mobility training;Therapeutic activities;Therapeutic exercise;Balance training;Orthotic Fit/Training;Patient/family education;Neuromuscular re-education;Manual techniques;Vestibular;Visual/perceptual remediation/compensation   PT Next Visit Plan Initiate HEP with focus on standing balance. Trial assistive devices (pt owns personal SPC and RW) to improve gait stability.   Consulted and Agree with Plan of Care Patient;Family member/caregiver   Family Member Consulted sister-in-law, Madolyn Frieze - 01-Dec-2014 04/12/43     Functional Assessment Tool Used DGI 12/24   Functional Limitation Mobility: Walking and moving around   Mobility: Walking and Moving Around Current Status [phone]) At least 40 percent but less than 60 percent impaired, limited  or restricted   Mobility: Walking and Moving Around Goal Status [phone]) At least 20 percent but less than 40 percent impaired, limited or restricted       Problem List Patient Active Problem List   Diagnosis Date Noted  . Dementia 05/21/2014  . 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 Cerebrovascular disease 05/21/2014  . Numbness 10/23/2011  . Candidiasis 04/29/2011  . Dehydration 04/25/2011  . Hypotension 04/25/2011  . Pyelonephritis 04/25/2011  . Urosepsis 04/25/2011  . Azotemia 04/25/2011  . Leukocytosis 04/25/2011  . Rhinitis 04/13/2010  . THRUSH 09/28/2009  . DIARRHEA 03/31/2009  . DYSPNEA 12/30/2008  . ADENOCARCINOMA, COLON 02/13/2008  . Obstructive sleep apnea 01/28/2007  . Asthma with COPD ([place]) 01/28/2007  . OSTEOARTHRITIS 01/28/2007    Billie Ruddy, PT, DPT [facility] [address] [place], [place], [address] Phone: [phone]   Fax:  [phone] 11/29/2014, 9:50 PM   Name: ALEXES DALMAN MRN: [id number] Date of Birth: November 23, 1934

## 2014-11-30 ENCOUNTER — Telehealth: Payer: Self-pay | Admitting: Neurology

## 2014-11-30 NOTE — Telephone Encounter (Signed)
PT called in regards to her blood work/Dawn CB# [phone]

## 2014-12-01 NOTE — Telephone Encounter (Signed)
Pt doesn't remember calling, will call back if question comes to her.

## 2014-12-08 ENCOUNTER — Ambulatory Visit: Payer: Commercial Managed Care - HMO | Admitting: Physical Therapy

## 2014-12-08 DIAGNOSIS — R269 Unspecified abnormalities of gait and mobility: Secondary | ICD-10-CM | POA: Diagnosis not present

## 2014-12-08 DIAGNOSIS — R42 Dizziness and giddiness: Secondary | ICD-10-CM

## 2014-12-08 DIAGNOSIS — R2681 Unsteadiness on feet: Secondary | ICD-10-CM

## 2014-12-08 NOTE — Therapy (Signed)
[facility] [address] [place], [place], [address] Phone: [phone]   Fax:  [phone]  Physical Therapy Treatment  Patient Details  Name: Nancy Cole MRN: [id number] Date of Birth: 1934/04/14 Referring Provider: Metta Clines, DO  Encounter Date: 12/08/2014      PT End of Session - 12/08/14 1115    Visit Number 2   Number of Visits 9   Date for PT Re-Evaluation 01/13/15   Authorization Type [employer]; G Codes   PT Start Time 0932   PT Stop Time 1016   PT Time Calculation (min) 44 min   Activity Tolerance Patient tolerated treatment well   Behavior During Therapy [facility] for tasks assessed/performed      Past Medical History  Diagnosis Date  . COPD (chronic obstructive pulmonary disease) ([place])     PFT April 29, 2005 FEV1/FVC 43%  PFT 01/12/10 FEV1 1.0/51%, FVC 2.0/79%, FEV1/FVC 0.48  . Osteoarthritis   . DDD (degenerative disc disease)   . OSA on CPAP   . Seasonal allergies   . Asthma   . Diverticulitis     Past Surgical History  Procedure Laterality Date  . Pleural scarification  1979    recurrent spontaneuous--L--- finally had tac pleurodesis  . Appendectomy    . Cholecystectomy    . Total knee arthroplasty      L  . Partial colectomy  04/30/2007    for diverticulitis  . Cataract extraction, bilateral      There were no vitals filed for this visit.  Visit Diagnosis:  Abnormality of gait  Dizziness and giddiness  Unsteadiness      Subjective Assessment - 12/08/14 0937    Subjective Pt reports no falls and no significant changes since last session.    Patient is accompained by: Family member  brother, Nancy Cole   Pertinent History Goes by Nancy Cole". COPD, asthma, OA, DDD, HTN,L macular degeneration, history of TIA's. MRI of brain without contrast from 06/09/14 showed diffuse atrophy and small vessel ischemic changes, including remote lacunar infarcts in both basal ganglia and cerebellum, as well as possible  occlusion of left vertebral artery.    Patient Stated Goals "I'd just like to be able to walk better and get around."   Currently in Pain? No/denies                         [facility] Adult PT Treatment/Exercise - 12/08/14 0001    Transfers   Transfers Sit to Stand;Stand to Sit   Sit to Stand 5: Supervision  using SPC, tripod cane   Sit to Stand Details (indicate cue type and reason) Pt initially reporting dizziness immediately after sit > stand. When pt utilized gaze fixation during transfer, pt reported no dizziness.   Stand to Sit 5: Supervision  using SPC, tripod cane   Stand to Sit Details Cueing for safe proximity to chair prior to sitting   Number of Reps Other reps (comment)  6   Ambulation/Gait   Ambulation/Gait Yes   Ambulation/Gait Assistance 5: Supervision   Ambulation/Gait Assistance Details [phone] with [facility] with [facility] assist for sequencing SPC with gait with inconsistent within-session carryover; gait x115' with tripod cane (tripod attachment on SPC) with (S), cueing for sequencing. Remainder of gait trials without AD with min guard, cueing for use of compensatory strategies (gaze fixation during linear gait and turning) with verbal reinforcement required throughout remainder of session. When using visual fixation during gait, pt exhibited improved  gait stability with turning and reported improved perception of balance, less blurred vision during turning.   Ambulation Distance (Feet) 430 Feet  2 x115' and x200'   Assistive device Straight cane;None;Other (Comment)  and tripod attachment on SPC   Gait Pattern Step-through pattern;Decreased stride length;Decreased hip/knee flexion - right;Decreased hip/knee flexion - left;Right foot flat;Left foot flat;Scissoring;Narrow base of support  no episodes of LLE scissoring   Ambulation Surface Level;Indoor   Neuro Re-ed    Neuro Re-ed Details  Standing approx. 6" from wall with visual targets on wall ~ 18" apart, pt moved  eyes, then head, then body toward each target to promote use of compensatory strategy when turning. Pt exhibited effective return demo. Performed x20 reps with verbal/demo cueing, as needed. Initiated [place] HEP; see Balance Exercises for details.   Exercises   Exercises --             Balance Exercises - 12/08/14 1007    [place] PROGRAM   Head Movements Standing;5 reps   Neck Movements Standing;5 reps   Back Extension Standing;5 reps   Trunk Movements Standing;5 reps   Ankle Movements Sitting;10 reps   Knee Flexor 10 reps  single UE support           PT Education - 12/08/14 1039    Education provided Yes   Education Details [place] HEP initiated. Sequencing gait with SPC. Recommending more stable base of cane (tripod) to increase safety. Compensatory strategy (visual fixation) during movement to increase safety.   Person(s) Educated Patient;Other (comment)  brother, Nancy Cole   Methods Explanation;Demonstration;Verbal cues;Handout   Comprehension Verbalized understanding;Returned demonstration;Need further instruction  Brother verbalized understanding. Pt may need reinforcement.          PT Short Term Goals - 11/29/14 2132    PT SHORT TERM GOAL #1   Title Pt will perform home exercises with use of paper handout and with assistance of family member to maximize functional gains made in PT. Target date: 12/20/14   PT SHORT TERM GOAL #2   Title Pt will improve DGI score from 12 to > / = 14/24 to indicate significant improvement in dynamic gait stability. Target date: 12/20/14   PT SHORT TERM GOAL #3   Title Pt will improve gait velocity from 1.8 ft/sec to > / = 2.1 ft/sec to indicate significant improvement in efficiency of ambulation.Target date: 12/20/14   PT SHORT TERM GOAL #4   Title Determine appropriate assistive device to increase stability/independence with functional mobility. Target date: 12/20/14   PT SHORT TERM GOAL #5   Title --   Additional Short Term Goals    Additional Short Term Goals --   PT SHORT TERM GOAL #6   Title --           PT Long Term Goals - 11/29/14 2132    PT LONG TERM GOAL #1   Title Pt will improve DGI score from 12 to > / = 16/24 to indicate significant improvement in dynamic gait stability. Target date: 01/10/15   PT LONG TERM GOAL #2   Title Pt will improve gait velocity from 1.8 ft/sec to > / = 2.4 ft/sec to indicate significant improvement in efficiency of ambulation. Target date: 01/10/15   PT LONG TERM GOAL #3   Title Pt will ambulate 300' over level, indoor surfaces with mod I using LRAD to indicate safety with household ambulation Target date: 01/10/15   PT LONG TERM GOAL #4   Title Pt will negotiate standard ramp  and curb step with supervision using LRAD to indicate pt safety traversing community obstacles. Target date: 01/10/15               Plan - 12/08/14 1115    Clinical Impression Statement Skilled session focused on initiating HEP, providing education on compensatory strategies for dizziness with movement, and trialing assistive devices to increase pt safety with mobility. Pt tolerated session well; SpO2 >91% throughout. Brother, Nancy Cole, present and to observe to ensure pt carryover. Brother very engaged and supportive throughout session. Gait stability improved with cane using tripod attachment (as opposed to [facility]); will need continued gait training to promote carryover of sequencing gait with SPC.   Pt will benefit from skilled therapeutic intervention in order to improve on the following deficits Decreased knowledge of use of DME;Abnormal gait;Decreased cognition;Cardiopulmonary status limiting activity;Decreased balance;Dizziness;Impaired vision/preception;Decreased strength   Rehab Potential Good   Clinical Impairments Affecting Rehab Potential cognitive impairments   PT Frequency 2x / week   PT Duration 6 weeks   PT Treatment/Interventions ADLs/Self Care Home Management;Canalith Repostioning;DME  Instruction;Gait training;Stair training;Functional mobility training;Therapeutic activities;Therapeutic exercise;Balance training;Orthotic Fit/Training;Patient/family education;Neuromuscular re-education;Manual techniques;Vestibular;Visual/perceptual remediation/compensation   PT Next Visit Plan [facility] teaching pt/family [place].   Consulted and Agree with Plan of Care Patient;Family member/caregiver   Family Member Consulted brother, Nancy Cole        Problem List Patient Active Problem List   Diagnosis Date Noted  . Dementia 05/21/2014  . 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 Cerebrovascular disease 05/21/2014  . Numbness 10/23/2011  . Candidiasis 04/29/2011  . Dehydration 04/25/2011  . Hypotension 04/25/2011  . Pyelonephritis 04/25/2011  . Urosepsis 04/25/2011  . Azotemia 04/25/2011  . Leukocytosis 04/25/2011  . Rhinitis 04/13/2010  . THRUSH 09/28/2009  . DIARRHEA 03/31/2009  . DYSPNEA 12/30/2008  . ADENOCARCINOMA, COLON 02/13/2008  . Obstructive sleep apnea 01/28/2007  . Asthma with COPD ([place]) 01/28/2007  . OSTEOARTHRITIS 01/28/2007   Billie Ruddy, PT, DPT [facility] [address] [place], [place], [address] Phone: [phone]   Fax:  [phone] 12/08/2014, 11:21 AM   Name: Nancy Cole MRN: [id number] Date of Birth: January 17, 1934

## 2014-12-10 ENCOUNTER — Ambulatory Visit: Payer: Commercial Managed Care - HMO | Attending: Neurology | Admitting: Physical Therapy

## 2014-12-10 DIAGNOSIS — R269 Unspecified abnormalities of gait and mobility: Secondary | ICD-10-CM | POA: Insufficient documentation

## 2014-12-10 DIAGNOSIS — R2681 Unsteadiness on feet: Secondary | ICD-10-CM | POA: Insufficient documentation

## 2014-12-10 DIAGNOSIS — R42 Dizziness and giddiness: Secondary | ICD-10-CM | POA: Diagnosis not present

## 2014-12-11 NOTE — Therapy (Signed)
[facility] [place] [address] [place], [place], [address] Phone: [phone]   Fax:  [phone]  Physical Therapy Treatment  Patient Details  Name: Nancy Cole MRN: [id number] Date of Birth: July 31, 1934 Referring Provider: Metta Clines, DO  Encounter Date: 12/10/2014      PT End of Session - 12/11/14 2048    Visit Number 3   Number of Visits 9   Date for PT Re-Evaluation 01/13/15   Authorization Type [employer]; G Codes   PT Start Time [phone]   PT Stop Time 1020   PT Time Calculation (min) 49 min   Activity Tolerance Patient tolerated treatment well   Behavior During Therapy [facility] for tasks assessed/performed      Past Medical History  Diagnosis Date  . COPD (chronic obstructive pulmonary disease) ([place])     PFT 2007 FEV1/FVC 43%  PFT 01/12/10 FEV1 1.0/51%, FVC 2.0/79%, FEV1/FVC 0.48  . Osteoarthritis   . DDD (degenerative disc disease)   . OSA on CPAP   . Seasonal allergies   . Asthma   . Diverticulitis     Past Surgical History  Procedure Laterality Date  . Pleural scarification  1979    recurrent spontaneuous--L--- finally had tac pleurodesis  . Appendectomy    . Cholecystectomy    . Total knee arthroplasty      L  . Partial colectomy  2009    for diverticulitis  . Cataract extraction, bilateral      There were no vitals filed for this visit.  Visit Diagnosis:  Abnormality of gait  Unsteadiness  Dizziness and giddiness                       [facility] Adult PT Treatment/Exercise - 12/11/14 0001    Transfers   Transfers Sit to Stand;Stand to Sit   Sit to Stand 6: Modified independent (Device/Increase time)  SPC with small quad attachment   Stand to Sit 5: Supervision  SPC with small quad attachment   Ambulation/Gait   Ambulation/Gait Yes   Ambulation/Gait Assistance 5: Supervision   Ambulation/Gait Assistance Details Pt with improved sequencing of SPC (able to correctly sequence for  ~75% of gait trial). Pt did require cueing for visual spotting during gait to increase stability.   Ambulation Distance (Feet) 210 Feet   Assistive device Other (Comment);Straight cane  SPC with small quad attachment   Gait Pattern Step-through pattern;Decreased stride length;Decreased hip/knee flexion - right;Decreased hip/knee flexion - left;Right foot flat;Left foot flat;Scissoring;Narrow base of support  no episodes of LLE scissoring   Ambulation Surface Level;Indoor   Neuro Re-ed    Neuro Re-ed Details  --             Balance Exercises - 12/11/14 2045    OTAGO PROGRAM   Head Movements Standing   Neck Movements Standing;5 reps   Back Extension Standing;5 reps   Trunk Movements Standing;5 reps   Ankle Movements Sitting;10 reps   Knee Extensor 10 reps   Knee Flexor 10 reps  single UE support   Hip ABductor 10 reps  BUE support   Ankle Plantorflexors 20 reps, support   Ankle Dorsiflexors 20 reps, support   Knee Bends 10 reps, no support   Backwards Walking Support  10 steps x4 reps   Walking and Turning Around --  attempted but did not add to HEP due to pt difficulty   Sideways Walking No assistive device  10 steps x4 reps  Tandem Walk Support  10 steps x4 reps           PT Education - 12/11/14 2040    Education provided Yes   Education Details Continued education on [place].   Person(s) Educated Patient;Other (comment)  sister-in-law Mickie   Methods Explanation;Demonstration;Verbal cues;Handout   Comprehension Verbalized understanding;Returned demonstration          PT Short Term Goals - 11/29/14 2132    PT SHORT TERM GOAL #1   Title Pt will perform home exercises with use of paper handout and with assistance of family member to maximize functional gains made in PT. Target date: 12/20/14   PT SHORT TERM GOAL #2   Title Pt will improve DGI score from 12 to > / = 14/24 to indicate significant improvement in dynamic gait stability. Target date:  12/20/14   PT SHORT TERM GOAL #3   Title Pt will improve gait velocity from 1.8 ft/sec to > / = 2.1 ft/sec to indicate significant improvement in efficiency of ambulation.Target date: 12/20/14   PT SHORT TERM GOAL #4   Title Determine appropriate assistive device to increase stability/independence with functional mobility. Target date: 12/20/14   PT SHORT TERM GOAL #5   Title --   Additional Short Term Goals   Additional Short Term Goals --   PT SHORT TERM GOAL #6   Title --           PT Long Term Goals - 11/29/14 2132    PT LONG TERM GOAL #1   Title Pt will improve DGI score from 12 to > / = 16/24 to indicate significant improvement in dynamic gait stability. Target date: 01/10/15   PT LONG TERM GOAL #2   Title Pt will improve gait velocity from 1.8 ft/sec to > / = 2.4 ft/sec to indicate significant improvement in efficiency of ambulation. Target date: 01/10/15   PT LONG TERM GOAL #3   Title Pt will ambulate 300' over level, indoor surfaces with mod I using LRAD to indicate safety with household ambulation Target date: 01/10/15   PT LONG TERM GOAL #4   Title Pt will negotiate standard ramp and curb step with supervision using LRAD to indicate pt safety traversing community obstacles. Target date: 01/10/15               Plan - 12/11/14 2048    Clinical Impression Statement Session focused on continued education on [place] and gait training with cueing for compensatory strategies for dizziness. Pt exhibited effective between-session carryover of initial [place] exercises provided last session. Pt continues to require frequent reinforcement of cueing for compensatory strategies. Pt did purchase quad atttachment for [facility] and exhibits improved gait stability using said AD.    Pt will benefit from skilled therapeutic intervention in order to improve on the following deficits Decreased knowledge of use of DME;Abnormal gait;Decreased cognition;Cardiopulmonary status limiting activity;Decreased  balance;Dizziness;Impaired vision/preception;Decreased strength   Rehab Potential Good   Clinical Impairments Affecting Rehab Potential cognitive impairments   PT Frequency 2x / week   PT Duration 6 weeks   PT Treatment/Interventions ADLs/Self Care Home Management;Canalith Repostioning;DME Instruction;Gait training;Stair training;Functional mobility training;Therapeutic activities;Therapeutic exercise;Balance training;Orthotic Fit/Training;Patient/family education;Neuromuscular re-education;Manual techniques;Vestibular;Visual/perceptual remediation/compensation   PT Next Visit Plan [facility] teaching pt/family [place].   Consulted and Agree with Plan of Care Patient;Family member/caregiver   Family Member Consulted sister-in-law, Mickie        Problem List Patient Active Problem List   Diagnosis Date Noted  . Dementia 05/21/2014  .  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 Cerebrovascular disease 05/21/2014  . Numbness 10/23/2011  . Candidiasis 04/29/2011  . Dehydration 04/25/2011  . Hypotension 04/25/2011  . Pyelonephritis 04/25/2011  . Urosepsis 04/25/2011  . Azotemia 04/25/2011  . Leukocytosis 04/25/2011  . Rhinitis 04/13/2010  . THRUSH 09/28/2009  . DIARRHEA 03/31/2009  . DYSPNEA 12/30/2008  . ADENOCARCINOMA, COLON 02/13/2008  . Obstructive sleep apnea 01/28/2007  . Asthma with COPD ([place]) 01/28/2007  . OSTEOARTHRITIS 01/28/2007    Billie Ruddy, PT, DPT [facility] [address] [place], [place], [address] Phone: [phone]   Fax:  [phone] 12/11/2014, 8:52 PM   Name: BASHEBA SEERY MRN: [id number] Date of Birth: Feb 17, 1934

## 2014-12-20 ENCOUNTER — Ambulatory Visit: Payer: Commercial Managed Care - HMO | Admitting: Physical Therapy

## 2014-12-20 DIAGNOSIS — R2681 Unsteadiness on feet: Secondary | ICD-10-CM | POA: Diagnosis not present

## 2014-12-20 DIAGNOSIS — R269 Unspecified abnormalities of gait and mobility: Secondary | ICD-10-CM | POA: Diagnosis not present

## 2014-12-20 DIAGNOSIS — R42 Dizziness and giddiness: Secondary | ICD-10-CM | POA: Diagnosis not present

## 2014-12-20 NOTE — Therapy (Signed)
[place] [address] [place], [place], [address] Phone: [phone]   Fax:  [phone]  Physical Therapy Treatment  Patient Details  Name: Nancy Cole MRN: [id number] Date of Birth: [birth year] Referring Provider: Metta Clines, DO  Encounter Date: 12/20/2014      PT End of Session - 12/20/14 1310    Visit Number 4   Number of Visits 9   Date for PT Re-Evaluation 01/13/15   Authorization Type [employer]; G Codes   PT Start Time [id number]   PT Stop Time 1244   PT Time Calculation (min) 50 min   Activity Tolerance Patient tolerated treatment well   Behavior During Therapy [facility] for tasks assessed/performed      Past Medical History  Diagnosis Date  . COPD (chronic obstructive pulmonary disease) ([place])     PFT 2007 FEV1/FVC 43%  PFT 01/12/10 FEV1 1.0/51%, FVC 2.0/79%, FEV1/FVC 0.48  . Osteoarthritis   . DDD (degenerative disc disease)   . OSA on CPAP   . Seasonal allergies   . Asthma   . Diverticulitis     Past Surgical History  Procedure Laterality Date  . Pleural scarification  1979    recurrent spontaneuous--L--- finally had tac pleurodesis  . Appendectomy    . Cholecystectomy    . Total knee arthroplasty      L  . Partial colectomy  2009    for diverticulitis  . Cataract extraction, bilateral      There were no vitals filed for this visit.  Visit Diagnosis:  Abnormality of gait  Unsteadiness      Subjective Assessment - 12/20/14 1200    Subjective Pt reports ongoing pain in L aspect of lower back. Pt states, "That's something I've dealt with for a long time." Pt no longer experiencing dizziness during mobility. Pt does perceive increased R ankle instability.    Patient is accompained by: Family member  brother, [name]   Pertinent History Goes by Nancy Cole". COPD, asthma, OA, DDD, HTN,L macular degeneration, history of TIA's. MRI of brain without contrast from 06/09/14 showed diffuse atrophy and small  vessel ischemic changes, including remote lacunar infarcts in both basal ganglia and cerebellum, as well as possible occlusion of left vertebral artery.    Patient Stated Goals "I'd just like to be able to walk better and get around."   Currently in Pain? Yes   Pain Score 3    Pain Location Back   Pain Orientation Left;Lower   Pain Descriptors / Indicators Aching   Pain Type Chronic pain   Pain Onset More than a month ago   Aggravating Factors  worst with exercise   Pain Relieving Factors pelvic tilt exercises provided previously by other provider   Multiple Pain Sites No                         [facility] Adult PT Treatment/Exercise - 12/20/14 0001    Transfers   Transfers Sit to Stand;Stand to Sit   Sit to Stand 6: Modified independent (Device/Increase time)  SPC with small quad attachment   Stand to Sit 5: Supervision  SPC with small quad attachment   Ambulation/Gait   Ambulation/Gait Yes   Ambulation/Gait Assistance 5: Supervision   Ambulation Distance (Feet) 400 Feet   Assistive device Other (Comment);Straight cane  SPC with small quad attachment   Gait Pattern Step-through pattern;Decreased stride length;Decreased hip/knee flexion - right;Decreased hip/knee flexion - left;Right foot flat;Left foot  flat;Scissoring;Narrow base of support  no episodes of LLE scissoring   Ambulation Surface Level;Indoor   Stairs Yes   Stairs Assistance 5: Supervision   Stair Management Technique One rail Left;Sideways;Step to pattern  to simulate entrance to back of home   Number of Stairs 8   Height of Stairs 6   Ramp 5: Supervision   Ramp Details (indicate cue type and reason) using SPC with quad tip   Curb 5: Supervision;4: Min assist   Curb Details (indicate cue type and reason) x5 trials consecutively with verbal, demo, and tactile cueing for sequencing using SPC with quad tip.  Required min A to recover from LOB sustained when descending leading with SPC the RLE (due to R  ankle instability).             Balance Exercises - 12/20/14 1202    OTAGO PROGRAM   Tandem Stance 10 seconds, support  2-finger tip support   One Leg Stand 10 seconds, support   Heel Walking Support   Toe Walk Support   Heel Toe Walking Backward --  not added to HEP due to safety concerns   Sit to Stand 10 reps, no support   Stair Walking x8 steps laterally with BUE support on L rail to ascend.           PT Education - 12/20/14 1250    Education provided Yes   Education Details Completed education on [place]. Curb step negotiation with SPC.   Person(s) Educated Patient;Other (comment)  brother, [name]   Methods Explanation;Demonstration;Verbal cues;Handout   Comprehension Verbalized understanding;Returned demonstration          PT Short Term Goals - 12/20/14 1231    PT SHORT TERM GOAL #1   Title Pt will perform home exercises with use of paper handout and with assistance of family member to maximize functional gains made in PT. Target date: 12/20/14   Status On-going   PT SHORT TERM GOAL #2   Title Pt will improve DGI score from 12 to > / = 14/24 to indicate significant improvement in dynamic gait stability. Target date: 12/20/14   Status On-going   PT SHORT TERM GOAL #3   Title Pt will improve gait velocity from 1.8 ft/sec to > / = 2.1 ft/sec to indicate significant improvement in efficiency of ambulation.Target date: 12/20/14   Status On-going   PT SHORT TERM GOAL #4   Title Determine appropriate assistive device to increase stability/independence with functional mobility. Target date: 12/20/14   Status Achieved           PT Long Term Goals - 11/29/14 2132    PT LONG TERM GOAL #1   Title Pt will improve DGI score from 12 to > / = 16/24 to indicate significant improvement in dynamic gait stability. Target date: 01/10/15   PT LONG TERM GOAL #2   Title Pt will improve gait velocity from 1.8 ft/sec to > / = 2.4 ft/sec to indicate significant improvement in  efficiency of ambulation. Target date: 01/10/15   PT LONG TERM GOAL #3   Title Pt will ambulate 300' over level, indoor surfaces with mod I using LRAD to indicate safety with household ambulation Target date: 01/10/15   PT LONG TERM GOAL #4   Title Pt will negotiate standard ramp and curb step with supervision using LRAD to indicate pt safety traversing community obstacles. Target date: 01/10/15               Plan -  12/20/14 1312    Clinical Impression Statement Session focused on completing education on [place]. Pt no longer reporting dizziness with mobility; however, pt does report R ankle instability. Pt required up to min A to negotiate standard curb step with SPC. Will continue to assess/address.   Pt will benefit from skilled therapeutic intervention in order to improve on the following deficits Decreased knowledge of use of DME;Abnormal gait;Decreased cognition;Cardiopulmonary status limiting activity;Decreased balance;Dizziness;Impaired vision/preception;Decreased strength   Rehab Potential Good   Clinical Impairments Affecting Rehab Potential cognitive impairments   PT Frequency 2x / week   PT Duration 6 weeks   PT Treatment/Interventions ADLs/Self Care Home Management;Canalith Repostioning;DME Instruction;Gait training;Stair training;Functional mobility training;Therapeutic activities;Therapeutic exercise;Balance training;Orthotic Fit/Training;Patient/family education;Neuromuscular re-education;Manual techniques;Vestibular;Visual/perceptual remediation/compensation   PT Next Visit Plan Check STG's. See if STG is shortened (brother, [name] to make modifications at home to shorten). Readdress curb step negotiation with SPC. Consider adding ankle strengthening HEP.   Consulted and Agree with Plan of Care Patient   Family Member Consulted brother, [name]        Problem List Patient Active Problem List   Diagnosis Date Noted  . Dementia 05/21/2014  . 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  Cerebrovascular disease 05/21/2014  . Numbness 10/23/2011  . Candidiasis 04/29/2011  . Dehydration 04/25/2011  . Hypotension 04/25/2011  . Pyelonephritis 04/25/2011  . Urosepsis 04/25/2011  . Azotemia 04/25/2011  . Leukocytosis 04/25/2011  . Rhinitis 04/13/2010  . THRUSH 09/28/2009  . DIARRHEA 03/31/2009  . DYSPNEA 12/30/2008  . ADENOCARCINOMA, COLON 02/13/2008  . Obstructive sleep apnea 01/28/2007  . Asthma with COPD ([place]) 01/28/2007  . OSTEOARTHRITIS 01/28/2007    Billie Ruddy, PT, DPT [facility] [address] [place], [place], [address] Phone: [phone]   Fax:  [phone] 12/20/2014, 7:22 PM  Name: Nancy Cole MRN: [id number] Date of Birth: 12-25-1934

## 2014-12-22 ENCOUNTER — Ambulatory Visit: Payer: Commercial Managed Care - HMO | Admitting: Physical Therapy

## 2014-12-22 VITALS — BP 124/86 | HR 66

## 2014-12-22 DIAGNOSIS — R269 Unspecified abnormalities of gait and mobility: Secondary | ICD-10-CM

## 2014-12-22 DIAGNOSIS — R2681 Unsteadiness on feet: Secondary | ICD-10-CM

## 2014-12-22 DIAGNOSIS — R42 Dizziness and giddiness: Secondary | ICD-10-CM | POA: Diagnosis not present

## 2014-12-22 NOTE — Therapy (Signed)
[facility] [address] [employer] [place], [place], [address] Phone: [phone]   Fax:  [phone]  Physical Therapy Treatment  Patient Details  Name: Nancy Cole MRN: [id number] Date of Birth: 04/14/1934 Referring Provider: Metta Clines, DO  Encounter Date: 12/22/2014      PT End of Session - 12/22/14 1256    Visit Number 5   Number of Visits 9   Date for PT Re-Evaluation 01/13/15   Authorization Type [employer]; G Codes   PT Start Time [phone]   PT Stop Time 1015   PT Time Calculation (min) 42 min   Activity Tolerance Patient tolerated treatment well   Behavior During Therapy [facility] for tasks assessed/performed      Past Medical History  Diagnosis Date  . COPD (chronic obstructive pulmonary disease) ([place])     PFT 2007 FEV1/FVC 43%  PFT 01/12/10 FEV1 1.0/51%, FVC 2.0/79%, FEV1/FVC 0.48  . Osteoarthritis   . DDD (degenerative disc disease)   . OSA on CPAP   . Seasonal allergies   . Asthma   . Diverticulitis     Past Surgical History  Procedure Laterality Date  . Pleural scarification  1979    recurrent spontaneuous--L--- finally had tac pleurodesis  . Appendectomy    . Cholecystectomy    . Total knee arthroplasty      L  . Partial colectomy  2009    for diverticulitis  . Cataract extraction, bilateral      Filed Vitals:   12/22/14 0940  BP: 124/86  Pulse: 66  SpO2: 94%    Visit Diagnosis:  Abnormality of gait  Unsteadiness      Subjective Assessment - 12/22/14 0941    Subjective Pt reports no pain in R ankle, ongoing pain in L aspect of lower back. Pt with increased headache pain today. Pt reports "Not feeling great" but would like to participate in PT.    Patient is accompained by: Family member  sister-in-law, Mickie   Pertinent History Goes by "Nancy Cole". COPD, asthma, OA, DDD, HTN,L macular degeneration, history of TIA's. MRI of brain without contrast from 06/09/14 showed diffuse atrophy and small  vessel ischemic changes, including remote lacunar infarcts in both basal ganglia and cerebellum, as well as possible occlusion of left vertebral artery.    Patient Stated Goals "I'd just like to be able to walk better and get around."   Currently in Pain? Yes   Pain Score 3    Pain Location Back   Pain Orientation Left;Lower   Pain Descriptors / Indicators Aching   Pain Type Chronic pain   Pain Onset More than a month ago   Pain Frequency Intermittent   Aggravating Factors  worst with standing balance activities   Pain Relieving Factors pelvic tilt exercises provided previously by other provider                         [facility] Adult PT Treatment/Exercise - 12/22/14 0001    Ambulation/Gait   Ambulation/Gait Yes   Ambulation/Gait Assistance 5: Supervision   Ambulation Distance (Feet) 520 Feet  x400' indoors, x100' outdoors   Assistive device Other (Comment);Straight cane  SPC with small quad attachment   Gait Pattern Step-through pattern;Decreased stride length;Decreased hip/knee flexion - right;Decreased hip/knee flexion - left;Right foot flat;Left foot flat;Scissoring;Narrow base of support  no episodes of LLE scissoring   Ambulation Surface Level;Unlevel;Indoor;Outdoor;Paved   Gait velocity 1.95 ft/sec   Stairs Yes  Stairs Assistance 6: Modified independent (Device/Increase time)   Stair Management Technique Two rails;Alternating pattern;Forwards   Number of Stairs 4   Height of Stairs 6   Curb 5: Supervision   Curb Details (indicate cue type and reason) using SPC with quad tip; cueing focused on sequencing. Pt most stable when ascending SPC, RLE, then LLE and descending SPC, LLE, then RLE.   Dynamic Gait Index   Level Surface Mild Impairment   Change in Gait Speed Mild Impairment   Gait with Horizontal Head Turns Moderate Impairment   Gait with Vertical Head Turns Mild Impairment   Gait and Pivot Turn Normal  as compared with gait over level surface   Step  Over Obstacle Moderate Impairment   Step Around Obstacles Mild Impairment   Steps Mild Impairment   Total Score 15                PT Education - 12/22/14 1255    Education provided Yes   Education Details STG and DGI findings, progress, and implicated fall risk.    Person(s) Educated Patient;Other (comment)  sister-in-law, Mickie   Methods Explanation   Comprehension Verbalized understanding          PT Short Term Goals - 12/22/14 0954    PT SHORT TERM GOAL #1   Title Pt will perform home exercises with use of paper handout and with assistance of family member to maximize functional gains made in PT. Target date: 12/20/14   Status Achieved   PT SHORT TERM GOAL #2   Title Pt will improve DGI score from 12 to > / = 14/24 to indicate significant improvement in dynamic gait stability. Target date: 12/20/14   Baseline 12/14: DGI score = 15/24   Status Achieved   PT SHORT TERM GOAL #3   Title Pt will improve gait velocity from 1.8 ft/sec to > / = 2.1 ft/sec to indicate significant improvement in efficiency of ambulation.Target date: 12/20/14   Baseline 12/14: 1.95 ft/sec using SPC with quad tip   Status Partially Met   PT SHORT TERM GOAL #4   Title Determine appropriate assistive device to increase stability/independence with functional mobility. Target date: 12/20/14   Status Achieved           PT Long Term Goals - 11/29/14 2132    PT LONG TERM GOAL #1   Title Pt will improve DGI score from 12 to > / = 16/24 to indicate significant improvement in dynamic gait stability. Target date: 01/10/15   PT LONG TERM GOAL #2   Title Pt will improve gait velocity from 1.8 ft/sec to > / = 2.4 ft/sec to indicate significant improvement in efficiency of ambulation. Target date: 01/10/15   PT LONG TERM GOAL #3   Title Pt will ambulate 300' over level, indoor surfaces with mod I using LRAD to indicate safety with household ambulation Target date: 01/10/15   PT LONG TERM GOAL #4   Title Pt  will negotiate standard ramp and curb step with supervision using LRAD to indicate pt safety traversing community obstacles. Target date: 01/10/15               Plan - 12/22/14 1257    Clinical Impression Statement Session focused on assessing STG's to determine functional progress thusfar. Pt met 3 of 4 STG's, suggesting safe/proper HEP performance, increased stability/independence with ambulation and increased dynamic gait stability. Remaining STG for gait speed was partially met, sa gait velocity improved from 1.8 ft/sec to 1.95  ft/sec but improvement was not to goal-level. Pt is making excellent progress toward LTG's and will continue to benefit from skilled outpatient PT to maximize safety with functional mobility and decrease fall risk.    Pt will benefit from skilled therapeutic intervention in order to improve on the following deficits Decreased knowledge of use of DME;Abnormal gait;Decreased cognition;Cardiopulmonary status limiting activity;Decreased balance;Dizziness;Impaired vision/preception;Decreased strength   Rehab Potential Good   Clinical Impairments Affecting Rehab Potential cognitive impairments   PT Frequency 2x / week   PT Duration 6 weeks   PT Treatment/Interventions ADLs/Self Care Home Management;Canalith Repostioning;DME Instruction;Gait training;Stair training;Functional mobility training;Therapeutic activities;Therapeutic exercise;Balance training;Orthotic Fit/Training;Patient/family education;Neuromuscular re-education;Manual techniques;Vestibular;Visual/perceptual remediation/compensation   PT Next Visit Plan Continue to work toward LTG's. Focus on gait with horizontal head turns due to instability with this.   Consulted and Agree with Plan of Care Patient;Family member/caregiver   Family Member Consulted sister-in-law, Mickie        Problem List Patient Active Problem List   Diagnosis Date Noted  . Dementia 05/21/2014  . 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  Cerebrovascular disease 05/21/2014  . Numbness 10/23/2011  . Candidiasis 04/29/2011  . Dehydration 04/25/2011  . Hypotension 04/25/2011  . Pyelonephritis 04/25/2011  . Urosepsis 04/25/2011  . Azotemia 04/25/2011  . Leukocytosis 04/25/2011  . Rhinitis 04/13/2010  . THRUSH 09/28/2009  . DIARRHEA 03/31/2009  . DYSPNEA 12/30/2008  . ADENOCARCINOMA, COLON 02/13/2008  . Obstructive sleep apnea 01/28/2007  . Asthma with COPD ([place]) 01/28/2007  . OSTEOARTHRITIS 01/28/2007    Billie Ruddy, PT, DPT [facility] [address] [place], [place], [address] Phone: [phone]   Fax:  [phone] 12/22/2014, 1:01 PM  Name: Nancy Cole MRN: [id number] Date of Birth: 1934/08/05

## 2014-12-27 ENCOUNTER — Ambulatory Visit: Payer: Commercial Managed Care - HMO | Admitting: Physical Therapy

## 2014-12-27 DIAGNOSIS — R269 Unspecified abnormalities of gait and mobility: Secondary | ICD-10-CM

## 2014-12-27 DIAGNOSIS — R2681 Unsteadiness on feet: Secondary | ICD-10-CM

## 2014-12-27 DIAGNOSIS — R42 Dizziness and giddiness: Secondary | ICD-10-CM | POA: Diagnosis not present

## 2014-12-27 NOTE — Patient Instructions (Addendum)
Weight Shift: Anterior / Posterior (Righting / Equilibrium)    BEGIN WITH BACK LEANING AGAINST THE WALL AND HEELS 4 INCHES AWAY. Move your hips off the wall and come to upright standing. * Make sure you don't lead with your shoulders. Hold standing position for 5 seconds. Return slowly to the wall letting your hips bump the wall first, then your shoulders. Then, return to standing by pulling your hips off the wall first.   Hold each position __5__ seconds. Repeat _10__ times per session. Do _2__ sessions per day.

## 2014-12-27 NOTE — Therapy (Signed)
[facility] [address] [place], [place], [address] Phone: [phone]   Fax:  [phone]  Physical Therapy Treatment  Patient Details  Name: Nancy Cole MRN: [id number] Date of Birth: 1934/05/04 Referring Provider: Metta Clines, DO  Encounter Date: 12/27/2014      PT End of Session - 12/27/14 1959    Visit Number 7   Number of Visits 9   Date for PT Re-Evaluation 01/13/15   Authorization Type [employer]; G Codes   PT Start Time [phone]   PT Stop Time 1013   PT Time Calculation (min) 50 min   Activity Tolerance Patient tolerated treatment well   Behavior During Therapy [facility] for tasks assessed/performed      Past Medical History  Diagnosis Date  . COPD (chronic obstructive pulmonary disease) ([place])     PFT 2007 FEV1/FVC 43%  PFT 01/12/10 FEV1 1.0/51%, FVC 2.0/79%, FEV1/FVC 0.48  . Osteoarthritis   . DDD (degenerative disc disease)   . OSA on CPAP   . Seasonal allergies   . Asthma   . Diverticulitis     Past Surgical History  Procedure Laterality Date  . Pleural scarification  1979    recurrent spontaneuous--L--- finally had tac pleurodesis  . Appendectomy    . Cholecystectomy    . Total knee arthroplasty      L  . Partial colectomy  2009    for diverticulitis  . Cataract extraction, bilateral      There were no vitals filed for this visit.  Visit Diagnosis:  Abnormality of gait  Unsteadiness      Subjective Assessment - 12/27/14 0925    Subjective Pt reports home exercises are going well; pt has no questions about HEP at this time. Pt dneies falls.   Patient is accompained by: Family member  brother, [name]   Pertinent History Goes by [name]". COPD, asthma, OA, DDD, HTN,L macular degeneration, history of TIA's. MRI of brain without contrast from 06/09/14 showed diffuse atrophy and small vessel ischemic changes, including remote lacunar infarcts in both basal ganglia and cerebellum, as well as possible  occlusion of left vertebral artery.    Patient Stated Goals "I'd just like to be able to walk better and get around."   Currently in Pain? Yes   Pain Score 4    Pain Location Back   Pain Orientation Left;Lower   Pain Descriptors / Indicators Aching   Pain Type Chronic pain   Pain Onset More than a month ago   Pain Frequency Intermittent   Aggravating Factors  standing, walking   Pain Relieving Factors pelvic tilt exercises given by previous provider   Multiple Pain Sites No                         [facility] Adult PT Treatment/Exercise - 12/27/14 0001    Ambulation/Gait   Ambulation/Gait Yes   Ambulation/Gait Assistance 5: Supervision   Ambulation Distance (Feet) 250 Feet  x400' indoors, x100' outdoors   Assistive device Other (Comment);Straight cane  SPC with small quad tip attachment   Gait Pattern Step-through pattern;Decreased stride length;Decreased hip/knee flexion - right;Decreased hip/knee flexion - left;Right foot flat;Left foot flat;Scissoring;Narrow base of support  no episodes of LLE scissoring   Ambulation Surface Level;Indoor   Gait velocity 1.95 ft/sec   Curb 5: Supervision   Curb Details (indicate cue type and reason) using SPC with quad tip attachment  Balance Exercises - 12/27/14 0955    Balance Exercises: Standing   Standing Eyes Opened Wide (BOA);Head turns;Foam/compliant surface;5 reps  Airex foam   Standing Eyes Closed Head turns;Foam/compliant surface;5 reps;Wide (BOA);Other (comment)  Airex foam   Wall Bumps Hip   Wall Bumps-Hips 20 reps;Eyes opened;Right/left (lateral)   Stepping Strategy Anterior;Posterior;10 reps  on beam with intermittent RUE support   Rockerboard Anterior/posterior;Head turns;10 reps;Intermittent UE support  small and large rocker board   Gait with Head Turns Forward;Upper extremity support;4 reps  10' x4 trials with single UE support   Tandem Gait Forward;2 reps;Other (comment)  10' x2 trials  with min guard   Retro Gait Other (comment)  10' x3 trials without UE support;cueing for BOS, arm swing           PT Education - 12/27/14 1956    Education provided Yes   Education Details HEP: added wall bumps for improved motor control in B hips, increased effectiveness of hip strategy with posterior LOB.   Person(s) Educated Patient;Other (comment)  brother, [name]   Methods Explanation;Demonstration;Verbal cues;Handout   Comprehension Verbalized understanding;Returned demonstration          PT Short Term Goals - 12/22/14 0954    PT SHORT TERM GOAL #1   Title Pt will perform home exercises with use of paper handout and with assistance of family member to maximize functional gains made in PT. Target date: 12/20/14   Status Achieved   PT SHORT TERM GOAL #2   Title Pt will improve DGI score from 12 to > / = 14/24 to indicate significant improvement in dynamic gait stability. Target date: 12/20/14   Baseline 12/14: DGI score = 15/24   Status Achieved   PT SHORT TERM GOAL #3   Title Pt will improve gait velocity from 1.8 ft/sec to > / = 2.1 ft/sec to indicate significant improvement in efficiency of ambulation.Target date: 12/20/14   Baseline 12/14: 1.95 ft/sec using SPC with quad tip   Status Partially Met   PT SHORT TERM GOAL #4   Title Determine appropriate assistive device to increase stability/independence with functional mobility. Target date: 12/20/14   Status Achieved           PT Long Term Goals - 11/29/14 2132    PT LONG TERM GOAL #1   Title Pt will improve DGI score from 12 to > / = 16/24 to indicate significant improvement in dynamic gait stability. Target date: 01/10/15   PT LONG TERM GOAL #2   Title Pt will improve gait velocity from 1.8 ft/sec to > / = 2.4 ft/sec to indicate significant improvement in efficiency of ambulation. Target date: 01/10/15   PT LONG TERM GOAL #3   Title Pt will ambulate 300' over level, indoor surfaces with mod I using LRAD to indicate  safety with household ambulation Target date: 01/10/15   PT LONG TERM GOAL #4   Title Pt will negotiate standard ramp and curb step with supervision using LRAD to indicate pt safety traversing community obstacles. Target date: 01/10/15               Plan - 12/27/14 1959    Clinical Impression Statement Skilled session focused on dynamic standing balance and dynamic gait activities. During static standing balance activities, pt tolerated all functional head turns with no report of dizziness/disequilibrium and with no postural instability. Pt continues to demo decreased gait stability when performing head turns while ambulating.   Pt will benefit from skilled therapeutic intervention  in order to improve on the following deficits Decreased knowledge of use of DME;Abnormal gait;Decreased cognition;Cardiopulmonary status limiting activity;Decreased balance;Dizziness;Impaired vision/preception;Decreased strength   Rehab Potential Good   Clinical Impairments Affecting Rehab Potential cognitive impairments   PT Frequency 2x / week   PT Duration 6 weeks   PT Next Visit Plan * Cancel appt on 12/29 due to only having approval for 9 sessions total. Continue to work toward LTG's. Focus on gait with horizontal head turns due to instability with this.   Consulted and Agree with Plan of Care Patient;Family member/caregiver   Family Member Consulted brother, [name]        Problem List Patient Active Problem List   Diagnosis Date Noted  . Dementia 05/21/2014  . 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 Cerebrovascular disease 05/21/2014  . Numbness 10/23/2011  . Candidiasis 04/29/2011  . Dehydration 04/25/2011  . Hypotension 04/25/2011  . Pyelonephritis 04/25/2011  . Urosepsis 04/25/2011  . Azotemia 04/25/2011  . Leukocytosis 04/25/2011  . Rhinitis 04/13/2010  . THRUSH 09/28/2009  . DIARRHEA 03/31/2009  . DYSPNEA 12/30/2008  . ADENOCARCINOMA, COLON 02/13/2008  . Obstructive sleep apnea 01/28/2007  .  Asthma with COPD ([place]) 01/28/2007  . OSTEOARTHRITIS 01/28/2007   Billie Ruddy, PT, DPT [facility] [address] [place], [place], [address] Phone: [phone]   Fax:  [phone] 12/27/2014, 8:03 PM  Name: LENELL MCCONNELL MRN: [id number] Date of Birth: 02/23/1934

## 2014-12-29 ENCOUNTER — Ambulatory Visit: Payer: Commercial Managed Care - HMO | Admitting: Physical Therapy

## 2014-12-29 DIAGNOSIS — R2681 Unsteadiness on feet: Secondary | ICD-10-CM

## 2014-12-29 DIAGNOSIS — R269 Unspecified abnormalities of gait and mobility: Secondary | ICD-10-CM

## 2014-12-29 DIAGNOSIS — R42 Dizziness and giddiness: Secondary | ICD-10-CM | POA: Diagnosis not present

## 2014-12-29 NOTE — Therapy (Signed)
Nancy Cole [address] [place], [place], [address] Phone: [phone]   Fax:  [phone]  Physical Therapy Treatment  Patient Details  Name: Nancy Cole MRN: [id number] Date of Birth: 1934-01-29 Referring Provider: Metta Clines, DO  Encounter Date: 12/29/2014      PT End of Session - 12/29/14 1252    Visit Number 8   Number of Visits 9   Date for PT Re-Evaluation 01/13/15   Authorization Type [employer]; G Codes   PT Start Time [phone]   PT Stop Time 1015   PT Time Calculation (min) 41 min   Activity Tolerance Patient tolerated treatment well;No increased pain   Behavior During Therapy [facility] for tasks assessed/performed      Past Medical History  Diagnosis Date  . COPD (chronic obstructive pulmonary disease) ([place])     PFT 2007 FEV1/FVC 43%  PFT 01/12/10 FEV1 1.0/51%, FVC 2.0/79%, FEV1/FVC 0.48  . Osteoarthritis   . DDD (degenerative disc disease)   . OSA on CPAP   . Seasonal allergies   . Asthma   . Diverticulitis     Past Surgical History  Procedure Laterality Date  . Pleural scarification  1979    recurrent spontaneuous--L--- finally had tac pleurodesis  . Appendectomy    . Cholecystectomy    . Total knee arthroplasty      L  . Partial colectomy  2009    for diverticulitis  . Cataract extraction, bilateral      There were no vitals filed for this visit.  Visit Diagnosis:  Abnormality of gait  Unsteadiness      Subjective Assessment - 12/29/14 1243    Subjective Pt reports no falls, no significant changes since last session. During PT session, pt continues to report ongoing R ankle instability.   Patient is accompained by: Family member  brother, [name]   Pertinent History Goes by Nancy Cole". COPD, asthma, OA, DDD, HTN,L macular degeneration, history of TIA's. 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    Patient Stated Goals "I'd just like to be able to walk better and get around."   Currently in Pain? No/denies                Vestibular Assessment - 12/29/14 0001    Vestibulo-Occular Reflex   VOR 1 Head Only (x 1 viewing) Even with slow horizontal head turns, pt frequently losing target with head turns to R side.                 [facility] Adult PT Treatment/Exercise - 12/29/14 0001    Ambulation/Gait   Ambulation/Gait Yes   Ambulation/Gait Assistance 5: Supervision   Ambulation/Gait Assistance Details During gait training, cued pt to utilize visual spotting (gaze fixation) to increase gait stability during turning.   Ambulation Distance (Feet) 345 Feet   Assistive device Other (Comment);Straight cane  SPC with small quad tip attachment   Gait Pattern Step-through pattern;Decreased stride length;Decreased hip/knee flexion - right;Decreased hip/knee flexion - left;Right foot flat;Left foot flat;Scissoring;Narrow base of support  no episodes of LLE scissoring   Ambulation Surface Level;Indoor   Self-Care   Self-Care Other Self-Care Comments   Other Self-Care Comments  Educated pt and brother, [name], on available ankle stabilizing orthoses (ASO) to decrease R ankle instability, ensure pt safety with mobility. Trialed 2  different ASO types (lace-up and  neoprene with heel lock) with pt-reported improvement in perceived stability and balance confidence when ambulating with neoprene brace.   Neuro Re-ed    Neuro Re-ed Details  Focused on gaze fixation (visual spotting) to increase gait stability during turning. Progressed from standing, using visual targets to R and L to increase stability with horizontal head turns x10 reps in static standing. Progressed to horizontal head turns with visual spotting in B semi tandem x10 reps per side. Progressed to short-distance gait (6-8') with SPc with  concurrent horizontal head turns, visual spotting x5 trials.             Balance Exercises - 12/29/14 1250    Balance Exercises: Standing   Wall Bumps Hip   Wall Bumps-Hips 20 reps  Cueing required for technique.           PT Education - 12/29/14 1244    Education provided Yes   Education Details Visual targeting with head turns. Ankle stabilizing orthoseis (right) as means of addressing pt-perceived R ankle instability.   Person(s) Educated Patient;Other (comment)  brother   Methods Explanation;Demonstration;Verbal cues   Comprehension Verbalized understanding;Returned demonstration          PT Short Term Goals - 12/22/14 0954    PT SHORT TERM GOAL #1   Title Pt will perform home exercises with use of paper handout and with assistance of family member to maximize functional gains made in PT. Target date: 12/20/14   Status Achieved   PT SHORT TERM GOAL #2   Title Pt will improve DGI score from 12 to > / = 14/24 to indicate significant improvement in dynamic gait stability. Target date: 12/20/14   Baseline 12/14: DGI score = 15/24   Status Achieved   PT SHORT TERM GOAL #3   Title Pt will improve gait velocity from 1.8 ft/sec to > / = 2.1 ft/sec to indicate significant improvement in efficiency of ambulation.Target date: 12/20/14   Baseline 12/14: 1.95 ft/sec using SPC with quad tip   Status Partially Met   PT SHORT TERM GOAL #4   Title Determine appropriate assistive device to increase stability/independence with functional mobility. Target date: 12/20/14   Status Achieved           PT Long Term Goals - 11/29/14 2132    PT LONG TERM GOAL #1   Title Pt will improve DGI score from 12 to > / = 16/24 to indicate significant improvement in dynamic gait stability. Target date: 01/10/15   PT LONG TERM GOAL #2   Title Pt will improve gait velocity from 1.8 ft/sec to > / = 2.4 ft/sec to indicate significant improvement in efficiency of ambulation. Target date: 01/10/15    PT LONG TERM GOAL #3   Title Pt will ambulate 300' over level, indoor surfaces with mod I using LRAD to indicate safety with household ambulation Target date: 01/10/15   PT LONG TERM GOAL #4   Title Pt will negotiate standard ramp and curb step with supervision using LRAD to indicate pt safety traversing community obstacles. Target date: 01/10/15               Plan - 12/29/14 1252    Clinical Impression Statement Session focused on revisiting gaze fixation/visual spotting as compensatory strategy for vestibular impairments during ambulation. Whereas previous sessions have focused on gaze fixation during linear gait, current session focusing on spotting during turning. Pt exhibited effective within-session carryover of technique. Provided pt/brother with education  on use of ASO to increase R ankle stability during mobility. Also suggested speaking with primary MD about pt seeing PT specializing in orthopedics to address ongoing R ankle pain and instability.    Pt will benefit from skilled therapeutic intervention in order to improve on the following deficits Decreased knowledge of use of DME;Abnormal gait;Decreased cognition;Cardiopulmonary status limiting activity;Decreased balance;Dizziness;Impaired vision/preception;Decreased strength   Rehab Potential Good   Clinical Impairments Affecting Rehab Potential cognitive impairments   PT Frequency 2x / week   PT Duration 6 weeks   PT Treatment/Interventions ADLs/Self Care Home Management;Canalith Repostioning;DME Instruction;Gait training;Stair training;Functional mobility training;Therapeutic activities;Therapeutic exercise;Balance training;Orthotic Fit/Training;Patient/family education;Neuromuscular re-education;Manual techniques;Vestibular;Visual/perceptual remediation/compensation   PT Next Visit Plan Check LTG's and tentatively plan on DC.   Consulted and Agree with Plan of Care Patient;Family member/caregiver   Family Member Consulted brother,  [name]        Problem List Patient Active Problem List   Diagnosis Date Noted  . Dementia 05/21/2014  . 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 Cerebrovascular disease 05/21/2014  . Numbness 10/23/2011  . Candidiasis 04/29/2011  . Dehydration 04/25/2011  . Hypotension 04/25/2011  . Pyelonephritis 04/25/2011  . Urosepsis 04/25/2011  . Azotemia 04/25/2011  . Leukocytosis 04/25/2011  . Rhinitis 04/13/2010  . THRUSH 09/28/2009  . DIARRHEA 03/31/2009  . DYSPNEA 12/30/2008  . ADENOCARCINOMA, COLON 02/13/2008  . Obstructive sleep apnea 01/28/2007  . Asthma with COPD ([place]) 01/28/2007  . OSTEOARTHRITIS 01/28/2007    Billie Ruddy, PT, DPT [facility] [address] [place], [place], [address] Phone: [phone]   Fax:  [phone] 12/29/2014, 12:58 PM   Name: Nancy Cole MRN: [id number] Date of Birth: 02-23-1934

## 2014-12-31 ENCOUNTER — Ambulatory Visit: Payer: Commercial Managed Care - HMO | Admitting: Internal Medicine

## 2015-01-04 ENCOUNTER — Ambulatory Visit: Payer: Commercial Managed Care - HMO | Admitting: Physical Therapy

## 2015-01-04 DIAGNOSIS — R42 Dizziness and giddiness: Secondary | ICD-10-CM | POA: Diagnosis not present

## 2015-01-04 DIAGNOSIS — R269 Unspecified abnormalities of gait and mobility: Secondary | ICD-10-CM | POA: Diagnosis not present

## 2015-01-04 DIAGNOSIS — R2681 Unsteadiness on feet: Secondary | ICD-10-CM

## 2015-01-04 NOTE — Therapy (Signed)
[facility] [address] [place], [place], [address] Phone: [phone]   Fax:  [phone]  Physical Therapy Treatment  Patient Details  Name: Nancy Cole MRN: [id number] Date of Birth: 04-10-1934 Referring Provider: Metta Clines, DO  Encounter Date: 01/04/2015      PT End of Session - 01/04/15 1003    Visit Number 9   Number of Visits 9   Date for PT Re-Evaluation 01/13/15   Authorization Type [employer]; G Codes   PT Start Time 0930   PT Stop Time 0959  session ended early due to last session, all goals met   PT Time Calculation (min) 29 min   Activity Tolerance Patient tolerated treatment well;No increased pain   Behavior During Therapy [facility] for tasks assessed/performed      Past Medical History  Diagnosis Date  . COPD (chronic obstructive pulmonary disease) ([place])     PFT 2007 FEV1/FVC 43%  PFT 01/12/10 FEV1 1.0/51%, FVC 2.0/79%, FEV1/FVC 0.48  . Osteoarthritis   . DDD (degenerative disc disease)   . OSA on CPAP   . Seasonal allergies   . Asthma   . Diverticulitis     Past Surgical History  Procedure Laterality Date  . Pleural scarification  1979    recurrent spontaneuous--L--- finally had tac pleurodesis  . Appendectomy    . Cholecystectomy    . Total knee arthroplasty      L  . Partial colectomy  2009    for diverticulitis  . Cataract extraction, bilateral      There were no vitals filed for this visit.  Visit Diagnosis:  Abnormality of gait  Unsteadiness      Subjective Assessment - 01/04/15 0935    Subjective Pt reports no falls, no significant changes. Pt reports no longer experiencing dizziness but does feel "off-balance" at times when walking. Pt continues to use SPC with quad tip for all mobility (indoor and outdoor). Pt rolled L ankle yesterday and reports some perceived instability today but no significant pain.    Patient is accompained by: Family member  sister-in-law, Nancy Cole    Pertinent History Goes by "Nancy Cole". COPD, asthma, OA, DDD, HTN,L macular degeneration, history of TIA's. MRI of brain without contrast from 06/09/14 showed diffuse atrophy and small vessel ischemic changes, including remote lacunar infarcts in both basal ganglia and cerebellum, as well as possible occlusion of left vertebral artery.    Patient Stated Goals "I'd just like to be able to walk better and get around."   Currently in Pain? Yes   Pain Score 4    Pain Location Back   Pain Orientation Left;Lower   Pain Descriptors / Indicators Aching   Pain Type Chronic pain   Pain Onset More than a month ago   Pain Frequency Intermittent   Aggravating Factors  standing, walking   Pain Relieving Factors Voltaren gel, pelvic tilt exercises   Multiple Pain Sites No                         [facility] Adult PT Treatment/Exercise - 01/04/15 0001    Ambulation/Gait   Ambulation/Gait Yes   Ambulation/Gait Assistance 6: Modified independent (Device/Increase time)   Ambulation Distance (Feet) 425 Feet   Assistive device Other (Comment);Straight cane  SPC with small quad tip attachment   Gait Pattern Step-through pattern;Decreased stride length;Decreased hip/knee flexion - right;Decreased hip/knee flexion - left;Right foot flat;Left foot flat;Scissoring;Narrow base of support  no episodes  of LLE scissoring   Ambulation Surface Level;Indoor   Gait velocity 1.83 ft/sec   Ramp 6: Modified independent (Device)   Ramp Details (indicate cue type and reason) using SPC   Curb 6: Modified independent (Device/increase time);5: Supervision   Curb Details (indicate cue type and reason) using SPC, pt mod I to ascend, supervision to descend.   Dynamic Gait Index   Level Surface Mild Impairment   Change in Gait Speed Normal  Unchanged compared with gait over levek surface   Gait with Horizontal Head Turns Moderate Impairment   Gait with Vertical Head Turns Mild Impairment   Gait and Pivot Turn Normal    Step Over Obstacle Moderate Impairment   Step Around Obstacles Normal   Steps Mild Impairment   Total Score 17             Balance Exercises - 01/04/15 1001    Balance Exercises: Standing   Wall Bumps Hip   Wall Bumps-Hips 5 reps  Effective between-session carryover, no cueing required           PT Education - 01/04/15 0934    Education provided Yes   Education Details DGI findings, funcitonal implications, fall risk. PT goals, progress, and DC plan. Modified SPC height to decrease lateral trunk shift to R side. Recommending pt notify PCP of ongoing pain and instability with B ankles at upcoming appt.   Person(s) Educated Patient;Other (comment)  sister-in-law, Nancy Cole   Methods Explanation   Comprehension Verbalized understanding          PT Short Term Goals - 12/22/14 0954    PT SHORT TERM GOAL #1   Title Pt will perform home exercises with use of paper handout and with assistance of family member to maximize functional gains made in PT. Target date: 12/20/14   Status Achieved   PT SHORT TERM GOAL #2   Title Pt will improve DGI score from 12 to > / = 14/24 to indicate significant improvement in dynamic gait stability. Target date: 12/20/14   Baseline 12/14: DGI score = 15/24   Status Achieved   PT SHORT TERM GOAL #3   Title Pt will improve gait velocity from 1.8 ft/sec to > / = 2.1 ft/sec to indicate significant improvement in efficiency of ambulation.Target date: 12/20/14   Baseline 12/14: 1.95 ft/sec using SPC with quad tip   Status Partially Met   PT SHORT TERM GOAL #4   Title Determine appropriate assistive device to increase stability/independence with functional mobility. Target date: 12/20/14   Status Achieved           PT Long Term Goals - 01/04/15 0941    PT LONG TERM GOAL #1   Title Pt will improve DGI score from 12 to > / = 16/24 to indicate significant improvement in dynamic gait stability. Target date: 01/10/15   Baseline Met 12/27, with DGI  score of 17/24.   Status Achieved   PT LONG TERM GOAL #2   Title Pt will improve gait velocity from 1.8 ft/sec to > / = 2.4 ft/sec to indicate significant improvement in efficiency of ambulation. Target date: 01/10/15   Baseline 12/27: gait velocity = 1.83 ft/sec with SPC   Status Not Met   PT LONG TERM GOAL #3   Title Pt will ambulate 300' over level, indoor surfaces with mod I using LRAD to indicate safety with household ambulation Target date: 01/10/15   Baseline Met 12/27.   Status Achieved   PT LONG TERM GOAL #  4   Title Pt will negotiate standard ramp and curb step with supervision using LRAD to indicate pt safety traversing community obstacles. Target date: 01/10/15   Baseline Met 01/14/23.   Status Achieved               Plan - January 14, 2015 1004    Clinical Impression Statement Pt will be discharged from outpatient PT at this time, as pt has met all STG's and 3 of 4 LTG's, reports no dizziness, and has maximized stability/independence with functional mobility. LTG for gait velocity unmet;; however, suspect this is due to pt use of compensatory strategies (SPC, gaze fixation) for central vestibular impairments during gait. Pt/family educated on goals, findings, progress, and DC plan with verbal understanding and full agreement with DC plan.    Consulted and Agree with Plan of Care Patient;Family member/caregiver   Family Member Consulted Nancy Cole - January 14, 2015 0109    Functional Assessment Tool Used DGI 17/24   Mobility: Walking and Moving Around Current Status [phone]) At least 20 percent but less than 40 percent impaired, limited or restricted   Mobility: Walking and Moving Around Goal Status [phone]) At least 20 percent but less than 40 percent impaired, limited or restricted     PHYSICAL THERAPY DISCHARGE SUMMARY  Visits from Start of Care: 9  Current functional level related to goals / functional outcomes: See goals and goal statuses above.    Remaining deficits: Pt continues to demonstrate decreased gait stability with functional head turns, stepping over obstacles; however, pt has exhibited effective carryover of compensatory strategies and now uses [facility] for all mobility.   Education / Equipment: [facility]); compensatory strategies for central vestibular impairments; recommendation for SPC with quad tip attachment. Plan: Patient agrees to discharge.  Patient goals were met. Patient is being discharged due to meeting the stated rehab goals.  ?????       Problem List Patient Active Problem List   Diagnosis Date Noted  . Dementia 05/21/2014  . 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 Cerebrovascular disease 05/21/2014  . Numbness 10/23/2011  . Candidiasis 04/29/2011  . Dehydration 04/25/2011  . Hypotension 04/25/2011  . Pyelonephritis 04/25/2011  . Urosepsis 04/25/2011  . Azotemia 04/25/2011  . Leukocytosis 04/25/2011  . Rhinitis 04/13/2010  . THRUSH 09/28/2009  . DIARRHEA 03/31/2009  . DYSPNEA 12/30/2008  . ADENOCARCINOMA, COLON 02/13/2008  . Obstructive sleep apnea 01/28/2007  . Asthma with COPD ([place]) 01/28/2007  . OSTEOARTHRITIS 01/28/2007    Billie Ruddy, PT, DPT [facility] [address] [place], [place], [address] Phone: [phone]   Fax:  [phone] 01-14-15, 10:07 AM  Name: Nancy Cole MRN: [id number] Date of Birth: 06/09/1934

## 2015-01-06 ENCOUNTER — Ambulatory Visit: Payer: Commercial Managed Care - HMO | Admitting: Physical Therapy

## 2015-01-12 DIAGNOSIS — R8290 Unspecified abnormal findings in urine: Secondary | ICD-10-CM | POA: Diagnosis not present

## 2015-01-12 DIAGNOSIS — M859 Disorder of bone density and structure, unspecified: Secondary | ICD-10-CM | POA: Diagnosis not present

## 2015-01-12 DIAGNOSIS — E038 Other specified hypothyroidism: Secondary | ICD-10-CM | POA: Diagnosis not present

## 2015-01-12 DIAGNOSIS — I10 Essential (primary) hypertension: Secondary | ICD-10-CM | POA: Diagnosis not present

## 2015-01-12 DIAGNOSIS — Z0000 Encounter for general adult medical examination without abnormal findings: Secondary | ICD-10-CM | POA: Diagnosis not present

## 2015-01-12 DIAGNOSIS — E784 Other hyperlipidemia: Secondary | ICD-10-CM | POA: Diagnosis not present

## 2015-01-12 DIAGNOSIS — N390 Urinary tract infection, site not specified: Secondary | ICD-10-CM | POA: Diagnosis not present

## 2015-01-20 DIAGNOSIS — F3341 Major depressive disorder, recurrent, in partial remission: Secondary | ICD-10-CM | POA: Diagnosis not present

## 2015-01-20 DIAGNOSIS — R627 Adult failure to thrive: Secondary | ICD-10-CM | POA: Diagnosis not present

## 2015-01-20 DIAGNOSIS — E784 Other hyperlipidemia: Secondary | ICD-10-CM | POA: Diagnosis not present

## 2015-01-20 DIAGNOSIS — F0390 Unspecified dementia without behavioral disturbance: Secondary | ICD-10-CM | POA: Diagnosis not present

## 2015-01-20 DIAGNOSIS — R51 Headache: Secondary | ICD-10-CM | POA: Diagnosis not present

## 2015-01-20 DIAGNOSIS — H8190 Unspecified disorder of vestibular function, unspecified ear: Secondary | ICD-10-CM | POA: Diagnosis not present

## 2015-01-20 DIAGNOSIS — M1990 Unspecified osteoarthritis, unspecified site: Secondary | ICD-10-CM | POA: Diagnosis not present

## 2015-01-20 DIAGNOSIS — Z0000 Encounter for general adult medical examination without abnormal findings: Secondary | ICD-10-CM | POA: Diagnosis not present

## 2015-01-20 DIAGNOSIS — I6529 Occlusion and stenosis of unspecified carotid artery: Secondary | ICD-10-CM | POA: Diagnosis not present

## 2015-02-16 ENCOUNTER — Encounter: Payer: Self-pay | Admitting: Internal Medicine

## 2015-02-16 ENCOUNTER — Ambulatory Visit (INDEPENDENT_AMBULATORY_CARE_PROVIDER_SITE_OTHER): Payer: Commercial Managed Care - HMO | Admitting: Internal Medicine

## 2015-02-16 ENCOUNTER — Ambulatory Visit (INDEPENDENT_AMBULATORY_CARE_PROVIDER_SITE_OTHER)
Admission: RE | Admit: 2015-02-16 | Discharge: 2015-02-16 | Disposition: A | Discharge status: Home / Self Care | Payer: Commercial Managed Care - HMO | Source: Ambulatory Visit | Attending: Internal Medicine | Admitting: Internal Medicine

## 2015-02-16 VITALS — BP 118/64 | HR 65 | Ht 61.0 in | Wt 160.0 lb

## 2015-02-16 DIAGNOSIS — G4733 Obstructive sleep apnea (adult) (pediatric): Secondary | ICD-10-CM

## 2015-02-16 DIAGNOSIS — J449 Chronic obstructive pulmonary disease, unspecified: Secondary | ICD-10-CM

## 2015-02-16 DIAGNOSIS — J45909 Unspecified asthma, uncomplicated: Secondary | ICD-10-CM

## 2015-02-16 DIAGNOSIS — J302 Other seasonal allergic rhinitis: Secondary | ICD-10-CM

## 2015-02-16 DIAGNOSIS — R0600 Dyspnea, unspecified: Secondary | ICD-10-CM | POA: Diagnosis not present

## 2015-02-16 MED ORDER — UMECLIDINIUM-VILANTEROL 62.5-25 MCG/INH IN AEPB: 1.0000 | INHALATION_SPRAY | Freq: Every day | RESPIRATORY_TRACT | Status: DC

## 2015-02-16 NOTE — Patient Instructions (Addendum)
Order- schedule PFT   Dx dyspnea, chronic obstructive asthma  Walk test on O2  Order- CXR     Sample Anoro Ellipta inhaler      Inhale 1 puff, once daily

## 2015-02-16 NOTE — Progress Notes (Signed)
Patient ID: Nancy Cole, female    DOB: Oct 04, 1934, 81 y.o.   MRN: [id number]  HPI 84 yoF never smoker, followed here for asthma/ fixed obstruction, OSA and last seen January 13, 2010 for surgical clearance before hernia surgery, which went very well. CPAP 9 was dropped off after surgery. It began bothering her last year- couldn't tolerate anything on her face. She didn't want to bother, and the pressure felt wrong. I discussed recent study finding dementia in untreated older women with untreated OSA and she is willing to restart.  Heavy pollen is causing watery eyes and nose.   10/13/10- 76 yoF never smoker, followed here for asthma/ fixed obstruction, OSA She does not recognize any seasonal changes as the fall weather has come in. Nasalcrom nasal spray did help. CPAP auto titration was done and we are searching for the download report. She is using it all night every night and feels comfortably rested during the daytime.  12/11/11- 63 yoF never smoker, followed here for asthma/ fixed obstruction, OSA FOLLOWS FOR:dry cough x weeks; denies any congesiton, SOB, or wheezing Hospitalized in April with urinary infection and dehydration. Hospital in October to rule out stroke/negative. No breathing problems through all of this and now feels well. Easy dyspnea on exertion blamed on deconditioning. Doing rehabilitation.  she stopped bothering with CPAP months ago. Her DME Hue[facility] Romans has told her she is due for a new machine. Question of a broken switch. CXR 04/26/11-reviewed IMPRESSION:  No pulmonary edema. Persistent streaky bilateral basilar  atelectasis or infiltrate.  Original Report Authenticated By: Lahoma Crocker, M.D.    02/15/12- 38 yoF never smoker, followed here for asthma/ fixed obstruction, OSA FOLLOWS FOR: states she has been more shortwinded today due to rushing. No real change in her breathing "feels fine". She went back to using her CPAP 9/[facility] every night. A brother who smokes has been  diagnosed with lung cancer. Another brother is followed here.  08/14/12- 78 yoF never smoker, followed here for asthma/ fixed obstruction, OSA 6 month follow up.  Tries to wear cpap every night for approx 7-8 hrs.  Mask is uncomforatble and slides down.  Breathing has been much better overall since last OV.  Some wheezing with humid weather, some discomfort in chest, and dry cough. CPAP 9/ [facility] . Sleep has been more restless so the current mask seems to come off more easily  CXR 02/15/12 IMPRESSION:  Stable chronic lung disease. Stable cardiomegaly.  Original Report Authenticated By: Roselyn Reef, M.D.  08/31/14- 78 yoF never smoker, followed here for asthma/ fixed obstruction, OSA CPAP 9/ [facility] FOLLOWS FOR: Per Dr Virgina Jock; has not worn CPAP for most of the year-has not felt well. Dr Virgina Jock wonders if Headaches could be coming from not usuing the CPAP. She felt badly "no energy, just couldn't bother" and stopped CPAP almost a year ago. She is had neurologic workup for headache and memory loss/dementia with small vessel changes on imaging. She recently started back on CPAP, mentions some mask discomfort, but admits she sleeps better wearing CPAP. Her machine works okay. Breathing has been stable without acute events or wheeze. Frequent dry cough. She dropped off Singulair and Spiriva, using rescue inhaler occasionally.  02/16/2015-81 year old female never smoker, followed for asthma/fixed obstructive airways disease, OSA CPAP 9/ [facility] FOLLOWS FOR: DME: [facility]; no recent DL at this time. Wears CPAP most every night for 4-8 hours. No new supplies needed at this time. Pt states she feels like she runs out  of O2 even when her O2 sats are normal. Some recent dry cough. Chronic "dripping" of eyes and watery nose. Just doesn't feel well but nothing specific. Frequent nocturia affects sleep. Using pro air several times daily. Easy dyspnea on exertion probably not much changed. Walk Test room air 2/8- no  desaturation below 91%.  Review of Systems-See HPI Constitutional:   No-   weight loss, night sweats, fevers, chills, +fatigue, lassitude. HEENT:   No-  headaches, difficulty swallowing, tooth/dental problems, sore throat,       No-  sneezing, itching, ear ache, nasal congestion, + post nasal drip,  CV:  No-   chest pain, orthopnea, PND, swelling in lower extremities, anasarca, dizziness, palpitations Resp: + shortness of breath with exertion or at rest.              No-   productive cough,  No non-productive cough,  No-  coughing up of blood.              No-   change in color of mucus.  No- wheezing.   Skin: No-   rash or lesions. GI:  No-   heartburn, indigestion, abdominal pain, nausea, vomiting,  GU: . MS:  No-   joint pain or swelling.   Neuro- nothing unusual/note hospital workup  Psych:  No- change in mood or affect. No depression or anxiety.  No memory loss.  Objective:   Physical Exam General- Alert, Oriented, Affect-appropriate, Distress- none acute. Elderly.  Skin- rash-none, lesions- none, excoriation- none Lymphadenopathy- none Head- atraumatic            Eyes- Gross vision intact, PERRLA, conjunctivae clear secretions            Ears- Hearing, canals-normal            Nose- Clear, no-Septal dev, mucus, polyps, erosion, perforation             Throat- Mallampati III , mucosa + thrush , drainage- none, tonsils- atrophic, + partial plate  Neck- flexible , trachea midline, no stridor , thyroid nl, carotid no bruit Chest - symmetrical excursion , unlabored           Heart/CV- RRR , no murmur , no gallop  , no rub, nl s1 s2                           - JVD- none , edema- none, stasis changes- none, varices- none           Lung- clear to P&A, wheeze- none, cough- none , dullness-none, rub- none           Chest wall-  Abd- Br/ Gen/ Rectal- Not done, not indicated Extrem- cyanosis- none, clubbing, none, atrophy- none, strength- nl Neuro- grossly intact to  observation Assessment & Plan:

## 2015-02-20 NOTE — Assessment & Plan Note (Signed)
Increased need for rescue inhaler Plan-PFT, walk test, CXR

## 2015-02-20 NOTE — Assessment & Plan Note (Addendum)
Download confirms good compliance and control with auto range 5-15. She says it does help her sleep and she feels better using it. Sleep is being disturbed more by frequent bathroom trips now.

## 2015-02-20 NOTE — Assessment & Plan Note (Signed)
Discussed nasal saline rinse, sample Dymista nasal spray

## 2015-02-28 ENCOUNTER — Telehealth: Payer: Self-pay | Admitting: Internal Medicine

## 2015-02-28 MED ORDER — UMECLIDINIUM-VILANTEROL 62.5-25 MCG/INH IN AEPB: 1.0000 | INHALATION_SPRAY | Freq: Every day | RESPIRATORY_TRACT | Status: DC

## 2015-02-28 NOTE — Telephone Encounter (Signed)
Called spoke with pt. She reports the anoro has really helped her and wants this called into the [facility]/ I have done so. Nothing further needed

## 2015-04-04 ENCOUNTER — Other Ambulatory Visit: Payer: Self-pay | Admitting: Neurology

## 2015-04-04 NOTE — Telephone Encounter (Signed)
Last OV: 11/22/14 Next OV: 05/24/15

## 2015-05-13 DIAGNOSIS — G4733 Obstructive sleep apnea (adult) (pediatric): Secondary | ICD-10-CM | POA: Diagnosis not present

## 2015-05-24 ENCOUNTER — Ambulatory Visit (INDEPENDENT_AMBULATORY_CARE_PROVIDER_SITE_OTHER): Payer: Commercial Managed Care - HMO | Admitting: Neurology

## 2015-05-24 ENCOUNTER — Encounter: Payer: Self-pay | Admitting: Neurology

## 2015-05-24 VITALS — BP 108/62 | HR 74 | Ht 61.0 in | Wt 165.0 lb

## 2015-05-24 DIAGNOSIS — I10 Essential (primary) hypertension: Secondary | ICD-10-CM

## 2015-05-24 DIAGNOSIS — G309 Alzheimer's disease, unspecified: Secondary | ICD-10-CM | POA: Diagnosis not present

## 2015-05-24 DIAGNOSIS — F0150 Vascular dementia without behavioral disturbance: Secondary | ICD-10-CM

## 2015-05-24 DIAGNOSIS — I679 Cerebrovascular disease, unspecified: Secondary | ICD-10-CM | POA: Diagnosis not present

## 2015-05-24 DIAGNOSIS — F0280 Dementia in other diseases classified elsewhere without behavioral disturbance: Secondary | ICD-10-CM

## 2015-05-24 NOTE — Patient Instructions (Addendum)
1.  Continue Aricept (donepizel) 10mg  at bedtime for memory 2.  Continue aspirin 81mg  daily 3.  Continue Lipitor 10mg  daily.  Will recheck fasting lipid panel 4.  Will get home health assessment for you 5.  Please let us know if you would like to have a driving evaluation 6.  Follow up in 9 months

## 2015-05-24 NOTE — Progress Notes (Signed)
Chart forwarded.  

## 2015-05-24 NOTE — Progress Notes (Signed)
NEUROLOGY FOLLOW UP OFFICE NOTE  Nancy Cole [id number]  HISTORY OF PRESENT ILLNESS: Nancy Cole is a 81 year old right-handed woman with asthma, COPD, hypertension, hypothyroidism, OSA, left macular degeneration and history of TIAs who follows up for memory deficits and cerebrovascular disease.  UPDATE: She is taking Aricept 10mg  daily Carotid doppler from f11/18/16 showed no hemodynamically significant ICA stenosis but demonstrated nonvisualization of left vertebral artery flow. She takes Lipitor 10mg  daily.  Last visit, I ordered a fasting lipid panel which apparently wasn't performed.   Overall, she is doing well. She still lives alone but her brother and sister-in-law live across the street.  They help her pay her bills and take her shopping.  She sleeps well and is able to cook without difficulty.  She has somebody clean her house and tend the yard.  She has not had any recurrent dizzy spells.  HISTORY: She reports problems with memory, gradually worse over the past three years.  She reports difficulty remembering names of people she knows.  She also has gotten lost while driving on familiar routes.  She no longer drives.  She also started making mistakes when paying the bills.  She feels disorganized and has trouble focusing.  She denies family history of dementia.  MRI of brain without contrast from 06/09/14 showed diffuse atrophy and small vessel ischemic changes, including remote lacunar infarcts in both basal ganglia and cerebellum, as well as possible occlusion of left vertebral artery.  B12 was 881.    She was evaluated by Dr. Erling Cruz in 2009 for blackouts.  At that time, she was in her car in the driveway and the next thing she knew, she was in the car after knocking down the fence in the backyard.  There was no preceding aura, tunnel vision or lightheadedness.  She was not confused when she woke up.  An MRI of the brain performed 08/07/07 reportedly showed chronic small vessel  disease in the periventricular white matter and cerebellum.  MRA of the head and neck performed on 09/09/07 showed a diminutive left vertebral artery with dominant right vertebral artery and without intracranial stenosis or carotid stenosis.    She has history of several TIAs, including one in October 2013.  She presented with left sided paresthesias and numbness.  MRI of the brain revealed no acute infarct, but again showed extensive white matter disease with chronic lacunes and probable occluded left vertebral artery.  Carotid doppler revealed 40-59% right ICA stenosis based on velocities, thought to be due to tortuosity.  Transcranial doppler was normal.  2D echo showed EF 60-65% with grade 1 diastolic dysfunction.  PAST MEDICAL HISTORY: Past Medical History  Diagnosis Date  . COPD (chronic obstructive pulmonary disease) ([facility])     PFT 2007 FEV1/FVC 43%  PFT 01/12/10 FEV1 1.0/51%, FVC 2.0/79%, FEV1/FVC 0.48  . Osteoarthritis   . DDD (degenerative disc disease)   . OSA on CPAP   . Seasonal allergies   . Asthma   . Diverticulitis     MEDICATIONS: Current Outpatient Prescriptions on File Prior to Visit  Medication Sig Dispense Refill  . aspirin 81 MG tablet Take 81 mg by mouth daily.    Marland Kitchen atorvastatin (LIPITOR) 20 MG tablet Take 20 mg by mouth daily.  3  . cetirizine (ZYRTEC) 10 MG tablet Take 10 mg by mouth daily.     . Cholecalciferol (VITAMIN D) 2000 UNITS CAPS Take 1 capsule by mouth daily.      Marland Kitchen donepezil (ARICEPT)  10 MG tablet TAKE 1 TABLET AT BEDTIME 90 tablet 1  . levothyroxine (SYNTHROID, LEVOTHROID) 100 MCG tablet Take 100 mcg by mouth daily.      . montelukast (SINGULAIR) 10 MG tablet TAKE 1 TABLET AT BEDTIME 90 tablet 0  . potassium chloride (K-DUR,KLOR-CON) 10 MEQ tablet Take 20 mEq by mouth 2 (two) times daily. 2 tabs twice daily    . sertraline (ZOLOFT) 100 MG tablet Take 100 mg by mouth daily.    Marland Kitchen umeclidinium-vilanterol (ANORO ELLIPTA) 62.5-25 MCG/INH AEPB Inhale 1 puff  into the lungs daily. 180 each 3   No current facility-administered medications on file prior to visit.    ALLERGIES: Allergies  Allergen Reactions  . Penicillins     REACTION: rash  . Sulfamethoxazole     REACTION: unspecified    FAMILY HISTORY: Family History  Problem Relation Age of Onset  . COPD Father     heavy smoker  . Heart attack Mother   . COPD Brother     smoker    SOCIAL HISTORY: Social History   Social History  . Marital Status: Divorced    Spouse Name: N/A  . Number of Children: N/A  . Years of Education: N/A   Occupational History  . 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 Smoking status: Never Smoker   . Smokeless tobacco: Never Used  . Alcohol Use: No  . Drug Use: No  . Sexual Activity: No   Other Topics Concern  . Not on file   Social History Narrative   Positive history of passive tobacco smoke exposure-significant exposure to parents, heavy smokers.     REVIEW OF SYSTEMS: Constitutional: No fevers, chills, or sweats, no generalized fatigue, change in appetite Eyes: No visual changes, double vision, eye pain Ear, nose and throat: No hearing loss, ear pain, nasal congestion, sore throat Cardiovascular: No chest pain, palpitations Respiratory:  No shortness of breath at rest or with exertion, wheezes GastrointestinaI: No nausea, vomiting, diarrhea, abdominal pain, fecal incontinence Genitourinary:  No dysuria, urinary retention or frequency Musculoskeletal:  No neck pain, back pain Integumentary: No rash, pruritus, skin lesions Neurological: as above Psychiatric: No depression, insomnia, anxiety Endocrine: No palpitations, fatigue, diaphoresis, mood swings, change in appetite, change in weight, increased thirst Hematologic/Lymphatic:  No purpura, petechiae. Allergic/Immunologic: no itchy/runny eyes, nasal congestion, recent allergic reactions, rashes  PHYSICAL EXAM: Filed Vitals:   05/24/15  1251  BP: 108/62  Pulse: 74   General: No acute distress.  Patient appears well-groomed.  normal body habitus. Head:  Normocephalic/atraumatic Eyes:  Fundi examined but not visualized Neck: supple, no paraspinal tenderness, full range of motion Heart:  Regular rate and rhythm Lungs:  Clear to auscultation bilaterally Back: No paraspinal tenderness Neurological Exam: alert and oriented to person, place, and time. Attention span and concentration intact, recent and remote memory intact, fund of knowledge intact.  Speech fluent and not dysarthric, language intact.   Montreal Cognitive Assessment  05/24/2015  Visuospatial/ Executive (0/5) 2  Naming (0/3) 3  Attention: Read list of digits (0/2) 1  Attention: Read list of letters (0/1) 1  Attention: Serial 7 subtraction starting at 100 (0/3) 2  Language: Repeat phrase (0/2) 1  Language : Fluency (0/1) 0  Abstraction (0/2) 2  Delayed Recall (0/5) 0  Orientation (0/6) 5  Total 17  Adjusted Score (based on education) 17   CN II-XII intact. Bulk and tone normal, muscle strength 5/5 throughout.  Sensation to light touch, temperature and vibration intact.  Deep tendon reflexes 2+ throughout, toes downgoing.  Finger to nose testing intact.  Gait antalgic due to back pain.  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  It appears fairly mild. Cerebrovascular disease with history of multiple TIAs Hypertension  PLAN: Continue Aricept 10mg  daily. ASA 81mg  daily Lipitor 10mg  daily.  Recheck fasting lipid panel (LDL goal should be less than 70) Blood pressure control Home Health assessment She is not driving but would like to.  I advised getting a formal OT driving evaluation, however payment would be out of pocket.  We provided information and she will get back to [place] if she wishes to pursue this. Follow up in 9 months.  28 minutes spent face to face with patient, over 50% spent discussing management and diagnosis.  Metta Clines, DO  CC:  Shon Baton, MD

## 2015-05-25 DIAGNOSIS — G308 Other Alzheimer's disease: Secondary | ICD-10-CM | POA: Diagnosis not present

## 2015-05-25 DIAGNOSIS — F0280 Dementia in other diseases classified elsewhere without behavioral disturbance: Secondary | ICD-10-CM | POA: Diagnosis not present

## 2015-05-25 DIAGNOSIS — I679 Cerebrovascular disease, unspecified: Secondary | ICD-10-CM | POA: Diagnosis not present

## 2015-05-25 DIAGNOSIS — M519 Unspecified thoracic, thoracolumbar and lumbosacral intervertebral disc disorder: Secondary | ICD-10-CM | POA: Diagnosis not present

## 2015-05-25 DIAGNOSIS — I10 Essential (primary) hypertension: Secondary | ICD-10-CM | POA: Diagnosis not present

## 2015-05-25 DIAGNOSIS — F0150 Vascular dementia without behavioral disturbance: Secondary | ICD-10-CM | POA: Diagnosis not present

## 2015-05-25 DIAGNOSIS — G4733 Obstructive sleep apnea (adult) (pediatric): Secondary | ICD-10-CM | POA: Diagnosis not present

## 2015-05-25 DIAGNOSIS — K5790 Diverticulosis of intestine, part unspecified, without perforation or abscess without bleeding: Secondary | ICD-10-CM | POA: Diagnosis not present

## 2015-05-25 DIAGNOSIS — J449 Chronic obstructive pulmonary disease, unspecified: Secondary | ICD-10-CM | POA: Diagnosis not present

## 2015-05-30 ENCOUNTER — Telehealth: Payer: Self-pay

## 2015-05-30 ENCOUNTER — Other Ambulatory Visit (INDEPENDENT_AMBULATORY_CARE_PROVIDER_SITE_OTHER): Payer: Commercial Managed Care - HMO

## 2015-05-30 DIAGNOSIS — I679 Cerebrovascular disease, unspecified: Secondary | ICD-10-CM | POA: Diagnosis not present

## 2015-05-30 LAB — LIPID PANEL
CHOL/HDL RATIO: 3
Cholesterol: 152 mg/dL (ref 0–200)
HDL: 49.2 mg/dL (ref >39.00)
LDL Cholesterol: 70 mg/dL (ref 0–99)
NONHDL: 102.3
Triglycerides: 160 mg/dL — ABNORMAL HIGH (ref 0.0–149.0)
VLDL: 32 mg/dL (ref 0.0–40.0)

## 2015-05-30 NOTE — Telephone Encounter (Signed)
Left message on machine for pt to return call to the office.  

## 2015-05-30 NOTE — Telephone Encounter (Signed)
-----   Message from Pieter Partridge, DO sent at 05/30/2015  2:03 PM EDT ----- LDL is fine.

## 2015-06-02 DIAGNOSIS — K5790 Diverticulosis of intestine, part unspecified, without perforation or abscess without bleeding: Secondary | ICD-10-CM | POA: Diagnosis not present

## 2015-06-02 DIAGNOSIS — M519 Unspecified thoracic, thoracolumbar and lumbosacral intervertebral disc disorder: Secondary | ICD-10-CM | POA: Diagnosis not present

## 2015-06-02 DIAGNOSIS — G308 Other Alzheimer's disease: Secondary | ICD-10-CM | POA: Diagnosis not present

## 2015-06-02 DIAGNOSIS — I679 Cerebrovascular disease, unspecified: Secondary | ICD-10-CM | POA: Diagnosis not present

## 2015-06-02 DIAGNOSIS — F0150 Vascular dementia without behavioral disturbance: Secondary | ICD-10-CM | POA: Diagnosis not present

## 2015-06-02 DIAGNOSIS — G4733 Obstructive sleep apnea (adult) (pediatric): Secondary | ICD-10-CM | POA: Diagnosis not present

## 2015-06-02 DIAGNOSIS — J449 Chronic obstructive pulmonary disease, unspecified: Secondary | ICD-10-CM | POA: Diagnosis not present

## 2015-06-02 DIAGNOSIS — F0280 Dementia in other diseases classified elsewhere without behavioral disturbance: Secondary | ICD-10-CM | POA: Diagnosis not present

## 2015-06-02 DIAGNOSIS — I10 Essential (primary) hypertension: Secondary | ICD-10-CM | POA: Diagnosis not present

## 2015-06-07 ENCOUNTER — Telehealth: Payer: Self-pay | Admitting: Neurology

## 2015-06-07 NOTE — Telephone Encounter (Signed)
Spoke with Anda Kraft - Speech Therapist - and gave verbal okay for patient to be seen once weekly for eight weeks. She will call with any problems.

## 2015-06-08 DIAGNOSIS — G4733 Obstructive sleep apnea (adult) (pediatric): Secondary | ICD-10-CM | POA: Diagnosis not present

## 2015-06-08 DIAGNOSIS — I10 Essential (primary) hypertension: Secondary | ICD-10-CM | POA: Diagnosis not present

## 2015-06-08 DIAGNOSIS — G308 Other Alzheimer's disease: Secondary | ICD-10-CM | POA: Diagnosis not present

## 2015-06-08 DIAGNOSIS — M519 Unspecified thoracic, thoracolumbar and lumbosacral intervertebral disc disorder: Secondary | ICD-10-CM | POA: Diagnosis not present

## 2015-06-08 DIAGNOSIS — I679 Cerebrovascular disease, unspecified: Secondary | ICD-10-CM | POA: Diagnosis not present

## 2015-06-08 DIAGNOSIS — J449 Chronic obstructive pulmonary disease, unspecified: Secondary | ICD-10-CM | POA: Diagnosis not present

## 2015-06-08 DIAGNOSIS — F0150 Vascular dementia without behavioral disturbance: Secondary | ICD-10-CM | POA: Diagnosis not present

## 2015-06-08 DIAGNOSIS — F0280 Dementia in other diseases classified elsewhere without behavioral disturbance: Secondary | ICD-10-CM | POA: Diagnosis not present

## 2015-06-08 DIAGNOSIS — K5790 Diverticulosis of intestine, part unspecified, without perforation or abscess without bleeding: Secondary | ICD-10-CM | POA: Diagnosis not present

## 2015-06-10 DIAGNOSIS — I10 Essential (primary) hypertension: Secondary | ICD-10-CM | POA: Diagnosis not present

## 2015-06-10 DIAGNOSIS — K5790 Diverticulosis of intestine, part unspecified, without perforation or abscess without bleeding: Secondary | ICD-10-CM | POA: Diagnosis not present

## 2015-06-10 DIAGNOSIS — I679 Cerebrovascular disease, unspecified: Secondary | ICD-10-CM | POA: Diagnosis not present

## 2015-06-10 DIAGNOSIS — G4733 Obstructive sleep apnea (adult) (pediatric): Secondary | ICD-10-CM | POA: Diagnosis not present

## 2015-06-10 DIAGNOSIS — G308 Other Alzheimer's disease: Secondary | ICD-10-CM | POA: Diagnosis not present

## 2015-06-10 DIAGNOSIS — M519 Unspecified thoracic, thoracolumbar and lumbosacral intervertebral disc disorder: Secondary | ICD-10-CM | POA: Diagnosis not present

## 2015-06-10 DIAGNOSIS — F0150 Vascular dementia without behavioral disturbance: Secondary | ICD-10-CM | POA: Diagnosis not present

## 2015-06-10 DIAGNOSIS — J449 Chronic obstructive pulmonary disease, unspecified: Secondary | ICD-10-CM | POA: Diagnosis not present

## 2015-06-10 DIAGNOSIS — F0280 Dementia in other diseases classified elsewhere without behavioral disturbance: Secondary | ICD-10-CM | POA: Diagnosis not present

## 2015-06-13 DIAGNOSIS — F0150 Vascular dementia without behavioral disturbance: Secondary | ICD-10-CM | POA: Diagnosis not present

## 2015-06-13 DIAGNOSIS — K5790 Diverticulosis of intestine, part unspecified, without perforation or abscess without bleeding: Secondary | ICD-10-CM | POA: Diagnosis not present

## 2015-06-13 DIAGNOSIS — G308 Other Alzheimer's disease: Secondary | ICD-10-CM | POA: Diagnosis not present

## 2015-06-13 DIAGNOSIS — J449 Chronic obstructive pulmonary disease, unspecified: Secondary | ICD-10-CM | POA: Diagnosis not present

## 2015-06-13 DIAGNOSIS — G4733 Obstructive sleep apnea (adult) (pediatric): Secondary | ICD-10-CM | POA: Diagnosis not present

## 2015-06-13 DIAGNOSIS — I679 Cerebrovascular disease, unspecified: Secondary | ICD-10-CM | POA: Diagnosis not present

## 2015-06-13 DIAGNOSIS — M519 Unspecified thoracic, thoracolumbar and lumbosacral intervertebral disc disorder: Secondary | ICD-10-CM | POA: Diagnosis not present

## 2015-06-13 DIAGNOSIS — F0280 Dementia in other diseases classified elsewhere without behavioral disturbance: Secondary | ICD-10-CM | POA: Diagnosis not present

## 2015-06-13 DIAGNOSIS — I10 Essential (primary) hypertension: Secondary | ICD-10-CM | POA: Diagnosis not present

## 2015-06-14 DIAGNOSIS — G308 Other Alzheimer's disease: Secondary | ICD-10-CM | POA: Diagnosis not present

## 2015-06-14 DIAGNOSIS — I679 Cerebrovascular disease, unspecified: Secondary | ICD-10-CM | POA: Diagnosis not present

## 2015-06-14 DIAGNOSIS — K5790 Diverticulosis of intestine, part unspecified, without perforation or abscess without bleeding: Secondary | ICD-10-CM | POA: Diagnosis not present

## 2015-06-14 DIAGNOSIS — F0150 Vascular dementia without behavioral disturbance: Secondary | ICD-10-CM | POA: Diagnosis not present

## 2015-06-14 DIAGNOSIS — F0280 Dementia in other diseases classified elsewhere without behavioral disturbance: Secondary | ICD-10-CM | POA: Diagnosis not present

## 2015-06-14 DIAGNOSIS — G4733 Obstructive sleep apnea (adult) (pediatric): Secondary | ICD-10-CM | POA: Diagnosis not present

## 2015-06-14 DIAGNOSIS — I10 Essential (primary) hypertension: Secondary | ICD-10-CM | POA: Diagnosis not present

## 2015-06-14 DIAGNOSIS — M519 Unspecified thoracic, thoracolumbar and lumbosacral intervertebral disc disorder: Secondary | ICD-10-CM | POA: Diagnosis not present

## 2015-06-14 DIAGNOSIS — J449 Chronic obstructive pulmonary disease, unspecified: Secondary | ICD-10-CM | POA: Diagnosis not present

## 2015-06-16 ENCOUNTER — Other Ambulatory Visit: Payer: Commercial Managed Care - HMO

## 2015-06-16 ENCOUNTER — Ambulatory Visit (INDEPENDENT_AMBULATORY_CARE_PROVIDER_SITE_OTHER): Payer: Commercial Managed Care - HMO | Admitting: Internal Medicine

## 2015-06-16 ENCOUNTER — Encounter: Payer: Self-pay | Admitting: Internal Medicine

## 2015-06-16 VITALS — BP 126/78 | HR 84 | Ht 60.5 in | Wt 164.0 lb

## 2015-06-16 DIAGNOSIS — J449 Chronic obstructive pulmonary disease, unspecified: Secondary | ICD-10-CM

## 2015-06-16 DIAGNOSIS — J45909 Unspecified asthma, uncomplicated: Secondary | ICD-10-CM

## 2015-06-16 DIAGNOSIS — G4733 Obstructive sleep apnea (adult) (pediatric): Secondary | ICD-10-CM | POA: Diagnosis not present

## 2015-06-16 LAB — PULMONARY FUNCTION TEST
DL/VA % PRED: 93 %
DL/VA: 4.04 ml/min/mmHg/L
DLCO cor % pred: 72 %
DLCO cor: 14.25 ml/min/mmHg
DLCO unc % pred: 75 %
DLCO unc: 14.67 ml/min/mmHg
FEF 25-75 POST: 0.44 L/s
FEF 25-75 PRE: 0.32 L/s
FEF2575-%CHANGE-POST: 37 %
FEF2575-%PRED-POST: 35 %
FEF2575-%PRED-PRE: 25 %
FEV1-%Change-Post: 8 %
FEV1-%PRED-PRE: 56 %
FEV1-%Pred-Post: 61 %
FEV1-POST: 1.01 L
FEV1-Pre: 0.93 L
FEV1FVC-%CHANGE-POST: 3 %
FEV1FVC-%PRED-PRE: 65 %
FEV6-%CHANGE-POST: 7 %
FEV6-%PRED-PRE: 84 %
FEV6-%Pred-Post: 90 %
FEV6-Post: 1.9 L
FEV6-Pre: 1.77 L
FEV6FVC-%Change-Post: 2 %
FEV6FVC-%Pred-Post: 101 %
FEV6FVC-%Pred-Pre: 98 %
FVC-%CHANGE-POST: 5 %
FVC-%PRED-POST: 90 %
FVC-%PRED-PRE: 85 %
FVC-POST: 2 L
FVC-PRE: 1.9 L
POST FEV1/FVC RATIO: 50 %
PRE FEV1/FVC RATIO: 49 %
Post FEV6/FVC ratio: 95 %
Pre FEV6/FVC Ratio: 93 %

## 2015-06-16 MED ORDER — ALBUTEROL SULFATE HFA 108 (90 BASE) MCG/ACT IN AERS: 2.0000 | INHALATION_SPRAY | Freq: Four times a day (QID) | RESPIRATORY_TRACT | Status: DC | PRN

## 2015-06-16 NOTE — Patient Instructions (Signed)
Order- lab- a 1 AT assay     Dx COPD mixed type  I renewed script in chart for an albuterol rescue inhaler to use if needed  We can continue Anoro  Keep on walking to maintain your stamina

## 2015-06-16 NOTE — Progress Notes (Signed)
Patient ID: Nancy Cole, female    DOB: Oct 04, 1934, 81 y.o.   MRN: [id number]  HPI 84 yoF never smoker, followed here for asthma/ fixed obstruction, OSA and last seen January 13, 2010 for surgical clearance before hernia surgery, which went very well. CPAP 9 was dropped off after surgery. It began bothering her last year- couldn't tolerate anything on her face. She didn't want to bother, and the pressure felt wrong. I discussed recent study finding dementia in untreated older women with untreated OSA and she is willing to restart.  Heavy pollen is causing watery eyes and nose.   10/13/10- 76 yoF never smoker, followed here for asthma/ fixed obstruction, OSA She does not recognize any seasonal changes as the fall weather has come in. Nasalcrom nasal spray did help. CPAP auto titration was done and we are searching for the download report. She is using it all night every night and feels comfortably rested during the daytime.  12/11/11- 63 yoF never smoker, followed here for asthma/ fixed obstruction, OSA FOLLOWS FOR:dry cough x weeks; denies any congesiton, SOB, or wheezing Hospitalized in April with urinary infection and dehydration. Hospital in October to rule out stroke/negative. No breathing problems through all of this and now feels well. Easy dyspnea on exertion blamed on deconditioning. Doing rehabilitation.  she stopped bothering with CPAP months ago. Her DME [facility]ue[facility] Romans has told her she is due for a new machine. Question of a broken switch. CXR 04/26/11-reviewed IMPRESSION:  No pulmonary edema. Persistent streaky bilateral basilar  atelectasis or infiltrate.  Original Report Authenticated By: Lahoma Crocker, M.D.    02/15/12- 38 yoF never smoker, followed here for asthma/ fixed obstruction, OSA FOLLOWS FOR: states she has been more shortwinded today due to rushing. No real change in her breathing "feels fine". She went back to using her CPAP 9/[facility] every night. A brother who smokes has been  diagnosed with lung cancer. Another brother is followed here.  08/14/12- 78 yoF never smoker, followed here for asthma/ fixed obstruction, OSA 6 month follow up.  Tries to wear cpap every night for approx 7-8 hrs.  Mask is uncomforatble and slides down.  Breathing has been much better overall since last OV.  Some wheezing with humid weather, some discomfort in chest, and dry cough. CPAP 9/ [facility] . Sleep has been more restless so the current mask seems to come off more easily  CXR 02/15/12 IMPRESSION:  Stable chronic lung disease. Stable cardiomegaly.  Original Report Authenticated By: Roselyn Reef, M.D.  08/31/14- 78 yoF never smoker, followed here for asthma/ fixed obstruction, OSA CPAP 9/ [facility] FOLLOWS FOR: Per Dr Virgina Jock; has not worn CPAP for most of the year-has not felt well. Dr Virgina Jock wonders if Headaches could be coming from not usuing the CPAP. She felt badly "no energy, just couldn't bother" and stopped CPAP almost a year ago. She is had neurologic workup for headache and memory loss/dementia with small vessel changes on imaging. She recently started back on CPAP, mentions some mask discomfort, but admits she sleeps better wearing CPAP. Her machine works okay. Breathing has been stable without acute events or wheeze. Frequent dry cough. She dropped off Singulair and Spiriva, using rescue inhaler occasionally.  02/16/2015-81 year old female never smoker, followed for asthma/fixed obstructive airways disease, OSA CPAP 9/ [facility] FOLLOWS FOR: DME: [facility]; no recent DL at this time. Wears CPAP most every night for 4-8 hours. No new supplies needed at this time. Pt states she feels like she runs out  of O2 even when her O2 sats are normal. Some recent dry cough. Chronic "dripping" of eyes and watery nose. Just doesn't feel well but nothing specific. Frequent nocturia affects sleep. Using pro air several times daily. Easy dyspnea on exertion probably not much changed. Walk Test room air 2/8- no  desaturation below 91%.  06/16/2015-81 year old female never smoker followed for asthma/fixed obstructive airways disease, OSA, rhinitis CPAP 9/[facility] PFT 06/16/2015-moderate obstructive airways disease-FVC 2.00/90%, FEV1 1.01/61%, FEV1/FVC 0.68, TLC 78%, DLCO 75%. Insignificant response to bronchodilator CXR 02/16/2015-IMPRESSION: Stable mild bibasilar scarring versus atelectasis. No acute cardiopulmonary disease. She continues to notice stable dyspnea on exertion without chest pain, palpitation or peripheral edema  Review of Systems-See HPI Constitutional:   No-   weight loss, night sweats, fevers, chills, +fatigue, lassitude. HEENT:   No-  headaches, difficulty swallowing, tooth/dental problems, sore throat,       No-  sneezing, itching, ear ache, nasal congestion, + post nasal drip,  CV:  No-   chest pain, orthopnea, PND, swelling in lower extremities, anasarca, dizziness, palpitations Resp: + shortness of breath with exertion or at rest.              No-   productive cough,  No non-productive cough,  No-  coughing up of blood.              No-   change in color of mucus.  No- wheezing.   Skin: No-   rash or lesions. GI:  No-   heartburn, indigestion, abdominal pain, nausea, vomiting,  GU: . MS:  No-   joint pain or swelling.   Neuro- nothing unusual/note hospital workup  Psych:  No- change in mood or affect. No depression or anxiety.  No memory loss.  Objective:   Physical Exam General- Alert, Oriented, Affect-appropriate, Distress- none acute. Elderly.  Skin- rash-none, lesions- none, excoriation- none Lymphadenopathy- none Head- atraumatic            Eyes- Gross vision intact, PERRLA, conjunctivae clear secretions            Ears- Hearing, canals-normal            Nose- Clear, no-Septal dev, mucus, polyps, erosion, perforation             Throat- Mallampati III , mucosa + thrush , drainage- none, tonsils- atrophic, + partial plate  Neck- flexible , trachea midline, no stridor  , thyroid nl, carotid no bruit Chest - symmetrical excursion , unlabored           Heart/CV- RRR , no murmur , no gallop  , no rub, nl s1 s2                           - JVD- none , edema- none, stasis changes- none, varices- none           Lung- clear to P&A, wheeze- none, cough- none , dullness-none, rub- none           Chest wall-  Abd- Br/ Gen/ Rectal- Not done, not indicated Extrem- cyanosis- none, clubbing, none, atrophy- none, strength- nl Neuro- grossly intact to observation Assessment & Plan:

## 2015-06-16 NOTE — Progress Notes (Signed)
PFT done today. 

## 2015-06-19 NOTE — Assessment & Plan Note (Signed)
Expect slow decline in scores as natural progression of disease without acute exacerbation. Plan-refill rescue inhaler, a1AT

## 2015-06-19 NOTE — Assessment & Plan Note (Signed)
She reports continued use of CPAP 9/[facility] with improved quality of life and decreased daytime sleepiness

## 2015-06-20 DIAGNOSIS — K5790 Diverticulosis of intestine, part unspecified, without perforation or abscess without bleeding: Secondary | ICD-10-CM | POA: Diagnosis not present

## 2015-06-20 DIAGNOSIS — G4733 Obstructive sleep apnea (adult) (pediatric): Secondary | ICD-10-CM | POA: Diagnosis not present

## 2015-06-20 DIAGNOSIS — F0150 Vascular dementia without behavioral disturbance: Secondary | ICD-10-CM | POA: Diagnosis not present

## 2015-06-20 DIAGNOSIS — J449 Chronic obstructive pulmonary disease, unspecified: Secondary | ICD-10-CM | POA: Diagnosis not present

## 2015-06-20 DIAGNOSIS — F0280 Dementia in other diseases classified elsewhere without behavioral disturbance: Secondary | ICD-10-CM | POA: Diagnosis not present

## 2015-06-20 DIAGNOSIS — G308 Other Alzheimer's disease: Secondary | ICD-10-CM | POA: Diagnosis not present

## 2015-06-20 DIAGNOSIS — I679 Cerebrovascular disease, unspecified: Secondary | ICD-10-CM | POA: Diagnosis not present

## 2015-06-20 DIAGNOSIS — M519 Unspecified thoracic, thoracolumbar and lumbosacral intervertebral disc disorder: Secondary | ICD-10-CM | POA: Diagnosis not present

## 2015-06-20 DIAGNOSIS — I10 Essential (primary) hypertension: Secondary | ICD-10-CM | POA: Diagnosis not present

## 2015-06-21 LAB — ALPHA-1 ANTITRYPSIN PHENOTYPE: A1 ANTITRYPSIN: 148 mg/dL (ref 83–199)

## 2015-06-23 DIAGNOSIS — F0150 Vascular dementia without behavioral disturbance: Secondary | ICD-10-CM | POA: Diagnosis not present

## 2015-06-23 DIAGNOSIS — M519 Unspecified thoracic, thoracolumbar and lumbosacral intervertebral disc disorder: Secondary | ICD-10-CM | POA: Diagnosis not present

## 2015-06-23 DIAGNOSIS — G308 Other Alzheimer's disease: Secondary | ICD-10-CM | POA: Diagnosis not present

## 2015-06-23 DIAGNOSIS — K5790 Diverticulosis of intestine, part unspecified, without perforation or abscess without bleeding: Secondary | ICD-10-CM | POA: Diagnosis not present

## 2015-06-23 DIAGNOSIS — I10 Essential (primary) hypertension: Secondary | ICD-10-CM | POA: Diagnosis not present

## 2015-06-23 DIAGNOSIS — G4733 Obstructive sleep apnea (adult) (pediatric): Secondary | ICD-10-CM | POA: Diagnosis not present

## 2015-06-23 DIAGNOSIS — J449 Chronic obstructive pulmonary disease, unspecified: Secondary | ICD-10-CM | POA: Diagnosis not present

## 2015-06-23 DIAGNOSIS — F0280 Dementia in other diseases classified elsewhere without behavioral disturbance: Secondary | ICD-10-CM | POA: Diagnosis not present

## 2015-06-23 DIAGNOSIS — I679 Cerebrovascular disease, unspecified: Secondary | ICD-10-CM | POA: Diagnosis not present

## 2015-06-24 NOTE — Progress Notes (Signed)
Quick Note:  Spoke with pt and notified of results per Dr. Wert. Pt verbalized understanding and denied any questions.  ______ 

## 2015-06-29 DIAGNOSIS — K5790 Diverticulosis of intestine, part unspecified, without perforation or abscess without bleeding: Secondary | ICD-10-CM | POA: Diagnosis not present

## 2015-06-29 DIAGNOSIS — I10 Essential (primary) hypertension: Secondary | ICD-10-CM | POA: Diagnosis not present

## 2015-06-29 DIAGNOSIS — G308 Other Alzheimer's disease: Secondary | ICD-10-CM | POA: Diagnosis not present

## 2015-06-29 DIAGNOSIS — G4733 Obstructive sleep apnea (adult) (pediatric): Secondary | ICD-10-CM | POA: Diagnosis not present

## 2015-06-29 DIAGNOSIS — J449 Chronic obstructive pulmonary disease, unspecified: Secondary | ICD-10-CM | POA: Diagnosis not present

## 2015-06-29 DIAGNOSIS — F0280 Dementia in other diseases classified elsewhere without behavioral disturbance: Secondary | ICD-10-CM | POA: Diagnosis not present

## 2015-06-29 DIAGNOSIS — M519 Unspecified thoracic, thoracolumbar and lumbosacral intervertebral disc disorder: Secondary | ICD-10-CM | POA: Diagnosis not present

## 2015-06-29 DIAGNOSIS — F0150 Vascular dementia without behavioral disturbance: Secondary | ICD-10-CM | POA: Diagnosis not present

## 2015-06-29 DIAGNOSIS — I679 Cerebrovascular disease, unspecified: Secondary | ICD-10-CM | POA: Diagnosis not present

## 2015-07-06 DIAGNOSIS — G4733 Obstructive sleep apnea (adult) (pediatric): Secondary | ICD-10-CM | POA: Diagnosis not present

## 2015-07-06 DIAGNOSIS — M519 Unspecified thoracic, thoracolumbar and lumbosacral intervertebral disc disorder: Secondary | ICD-10-CM | POA: Diagnosis not present

## 2015-07-06 DIAGNOSIS — G308 Other Alzheimer's disease: Secondary | ICD-10-CM | POA: Diagnosis not present

## 2015-07-06 DIAGNOSIS — F0150 Vascular dementia without behavioral disturbance: Secondary | ICD-10-CM | POA: Diagnosis not present

## 2015-07-06 DIAGNOSIS — I10 Essential (primary) hypertension: Secondary | ICD-10-CM | POA: Diagnosis not present

## 2015-07-06 DIAGNOSIS — K5790 Diverticulosis of intestine, part unspecified, without perforation or abscess without bleeding: Secondary | ICD-10-CM | POA: Diagnosis not present

## 2015-07-06 DIAGNOSIS — I679 Cerebrovascular disease, unspecified: Secondary | ICD-10-CM | POA: Diagnosis not present

## 2015-07-06 DIAGNOSIS — F0280 Dementia in other diseases classified elsewhere without behavioral disturbance: Secondary | ICD-10-CM | POA: Diagnosis not present

## 2015-07-06 DIAGNOSIS — J449 Chronic obstructive pulmonary disease, unspecified: Secondary | ICD-10-CM | POA: Diagnosis not present

## 2015-07-15 DIAGNOSIS — M519 Unspecified thoracic, thoracolumbar and lumbosacral intervertebral disc disorder: Secondary | ICD-10-CM | POA: Diagnosis not present

## 2015-07-15 DIAGNOSIS — F0280 Dementia in other diseases classified elsewhere without behavioral disturbance: Secondary | ICD-10-CM | POA: Diagnosis not present

## 2015-07-15 DIAGNOSIS — I679 Cerebrovascular disease, unspecified: Secondary | ICD-10-CM | POA: Diagnosis not present

## 2015-07-15 DIAGNOSIS — J449 Chronic obstructive pulmonary disease, unspecified: Secondary | ICD-10-CM | POA: Diagnosis not present

## 2015-07-15 DIAGNOSIS — G308 Other Alzheimer's disease: Secondary | ICD-10-CM | POA: Diagnosis not present

## 2015-07-15 DIAGNOSIS — F0150 Vascular dementia without behavioral disturbance: Secondary | ICD-10-CM | POA: Diagnosis not present

## 2015-07-15 DIAGNOSIS — I10 Essential (primary) hypertension: Secondary | ICD-10-CM | POA: Diagnosis not present

## 2015-07-15 DIAGNOSIS — G4733 Obstructive sleep apnea (adult) (pediatric): Secondary | ICD-10-CM | POA: Diagnosis not present

## 2015-07-15 DIAGNOSIS — K5790 Diverticulosis of intestine, part unspecified, without perforation or abscess without bleeding: Secondary | ICD-10-CM | POA: Diagnosis not present

## 2015-07-21 ENCOUNTER — Telehealth: Payer: Self-pay | Admitting: Neurology

## 2015-07-21 DIAGNOSIS — G309 Alzheimer's disease, unspecified: Principal | ICD-10-CM

## 2015-07-21 DIAGNOSIS — F0280 Dementia in other diseases classified elsewhere without behavioral disturbance: Principal | ICD-10-CM

## 2015-07-21 DIAGNOSIS — F0150 Vascular dementia without behavioral disturbance: Secondary | ICD-10-CM

## 2015-07-21 NOTE — Telephone Encounter (Signed)
PT called and said she needed to call and let Dr Tomi Likens know when she wanted her driver's license back/[place]  CB# [phone]

## 2015-07-22 ENCOUNTER — Telehealth: Payer: Self-pay | Admitting: Neurology

## 2015-07-22 NOTE — Telephone Encounter (Signed)
She was calling you back needing the Driver's Evaluation #. She said she didn't have the right one. Her # is [phone] [id number]. Thank you

## 2015-07-22 NOTE — Telephone Encounter (Signed)
Left message on machine for pt to return call to the office.  

## 2015-07-22 NOTE — Telephone Encounter (Signed)
Referral for a driving evaluation faxed to [facility]. Will call patient at later hour to let her know/give contact [facility] OT number [phone]

## 2015-07-22 NOTE — Telephone Encounter (Signed)
Message relayed to patient. Verbalized understanding and denied questions.   

## 2015-07-22 NOTE — Telephone Encounter (Signed)
PEr A&P from last OV:   She is not driving but would like to. I advised getting a formal OT driving evaluation, however payment would be out of pocket. We provided information and she will get back to [place] if she wishes to pursue this.

## 2015-07-22 NOTE — Telephone Encounter (Signed)
[facility] [address] [place] [address]. Info relayed to pt.

## 2015-08-12 DIAGNOSIS — G4733 Obstructive sleep apnea (adult) (pediatric): Secondary | ICD-10-CM | POA: Diagnosis not present

## 2015-08-12 DIAGNOSIS — I6529 Occlusion and stenosis of unspecified carotid artery: Secondary | ICD-10-CM | POA: Diagnosis not present

## 2015-08-12 DIAGNOSIS — R627 Adult failure to thrive: Secondary | ICD-10-CM | POA: Diagnosis not present

## 2015-08-12 DIAGNOSIS — F3341 Major depressive disorder, recurrent, in partial remission: Secondary | ICD-10-CM | POA: Diagnosis not present

## 2015-08-12 DIAGNOSIS — E038 Other specified hypothyroidism: Secondary | ICD-10-CM | POA: Diagnosis not present

## 2015-08-12 DIAGNOSIS — I10 Essential (primary) hypertension: Secondary | ICD-10-CM | POA: Diagnosis not present

## 2015-08-12 DIAGNOSIS — J449 Chronic obstructive pulmonary disease, unspecified: Secondary | ICD-10-CM | POA: Diagnosis not present

## 2015-08-12 DIAGNOSIS — Z8679 Personal history of other diseases of the circulatory system: Secondary | ICD-10-CM | POA: Diagnosis not present

## 2015-08-12 DIAGNOSIS — F0390 Unspecified dementia without behavioral disturbance: Secondary | ICD-10-CM | POA: Diagnosis not present

## 2015-08-16 ENCOUNTER — Other Ambulatory Visit: Payer: Self-pay | Admitting: Neurology

## 2015-09-21 DIAGNOSIS — M859 Disorder of bone density and structure, unspecified: Secondary | ICD-10-CM | POA: Diagnosis not present

## 2015-11-09 ENCOUNTER — Telehealth: Payer: Self-pay | Admitting: Neurology

## 2015-11-09 NOTE — Telephone Encounter (Signed)
Unsure what patient is talking about. Attempted to reach on number provided. Line continuously rang.

## 2015-11-09 NOTE — Telephone Encounter (Signed)
Spoke with Dr. Tomi Likens, he is unsure as well. Did attempt to reach pt again. Line continuously rang. Will close encounter at this time.

## 2015-11-09 NOTE — Telephone Encounter (Signed)
Nancy Cole December 09, 2034. Her # [phone] [id number]. She heard there was 2 newer drugs that were more effective as well as a nasal spray. She is already taking a medication, but  she would like a prescription for one of the newer ones. Her pharmacy is [facility]. Thank you

## 2016-01-05 ENCOUNTER — Telehealth: Payer: Self-pay | Admitting: Neurology

## 2016-01-05 NOTE — Telephone Encounter (Signed)
Patient wants to talk to some one about using a different drug please call [phone]

## 2016-01-05 NOTE — Telephone Encounter (Signed)
Spoke with patient.  She states she has heard about some new medications for memory that she is interested in.  She did not remember the names of the medications.  Made her aware since she has an appt in 6 weeks it would be best to stay on Aricept (since she is not having any issues or side effects) and she could get the names of the medications she is interested in and discuss them with Dr. Tomi Likens at the visit.  She agrees with this plan and will keep follow up appt.

## 2016-01-05 NOTE — Telephone Encounter (Signed)
Nancy Cole Jul 20, 2034. She called to cancel the refill for her Aricept. She said she has enough to last her until her appointment in February. She has been getting them every 2 months instead of 3 Months. Thank you

## 2016-01-11 DIAGNOSIS — G4733 Obstructive sleep apnea (adult) (pediatric): Secondary | ICD-10-CM | POA: Diagnosis not present

## 2016-01-16 DIAGNOSIS — I10 Essential (primary) hypertension: Secondary | ICD-10-CM | POA: Diagnosis not present

## 2016-01-16 DIAGNOSIS — E038 Other specified hypothyroidism: Secondary | ICD-10-CM | POA: Diagnosis not present

## 2016-01-16 DIAGNOSIS — E784 Other hyperlipidemia: Secondary | ICD-10-CM | POA: Diagnosis not present

## 2016-01-16 DIAGNOSIS — R8299 Other abnormal findings in urine: Secondary | ICD-10-CM | POA: Diagnosis not present

## 2016-01-16 DIAGNOSIS — M859 Disorder of bone density and structure, unspecified: Secondary | ICD-10-CM | POA: Diagnosis not present

## 2016-01-23 DIAGNOSIS — I6529 Occlusion and stenosis of unspecified carotid artery: Secondary | ICD-10-CM | POA: Diagnosis not present

## 2016-01-23 DIAGNOSIS — G4733 Obstructive sleep apnea (adult) (pediatric): Secondary | ICD-10-CM | POA: Diagnosis not present

## 2016-01-23 DIAGNOSIS — J449 Chronic obstructive pulmonary disease, unspecified: Secondary | ICD-10-CM | POA: Diagnosis not present

## 2016-01-23 DIAGNOSIS — I10 Essential (primary) hypertension: Secondary | ICD-10-CM | POA: Diagnosis not present

## 2016-01-23 DIAGNOSIS — J45909 Unspecified asthma, uncomplicated: Secondary | ICD-10-CM | POA: Diagnosis not present

## 2016-01-23 DIAGNOSIS — Z0000 Encounter for general adult medical examination without abnormal findings: Secondary | ICD-10-CM | POA: Diagnosis not present

## 2016-01-23 DIAGNOSIS — R627 Adult failure to thrive: Secondary | ICD-10-CM | POA: Diagnosis not present

## 2016-01-23 DIAGNOSIS — E038 Other specified hypothyroidism: Secondary | ICD-10-CM | POA: Diagnosis not present

## 2016-01-23 DIAGNOSIS — F0390 Unspecified dementia without behavioral disturbance: Secondary | ICD-10-CM | POA: Diagnosis not present

## 2016-01-26 DIAGNOSIS — Z1212 Encounter for screening for malignant neoplasm of rectum: Secondary | ICD-10-CM | POA: Diagnosis not present

## 2016-02-06 ENCOUNTER — Other Ambulatory Visit: Payer: Self-pay | Admitting: Internal Medicine

## 2016-02-24 ENCOUNTER — Encounter: Payer: Self-pay | Admitting: Neurology

## 2016-02-24 ENCOUNTER — Ambulatory Visit (INDEPENDENT_AMBULATORY_CARE_PROVIDER_SITE_OTHER): Payer: Medicare HMO | Admitting: Neurology

## 2016-02-24 VITALS — BP 120/76 | HR 70 | Ht 60.5 in | Wt 167.1 lb

## 2016-02-24 DIAGNOSIS — G309 Alzheimer's disease, unspecified: Secondary | ICD-10-CM

## 2016-02-24 DIAGNOSIS — F0150 Vascular dementia without behavioral disturbance: Secondary | ICD-10-CM | POA: Diagnosis not present

## 2016-02-24 DIAGNOSIS — F0280 Dementia in other diseases classified elsewhere without behavioral disturbance: Secondary | ICD-10-CM | POA: Diagnosis not present

## 2016-02-24 DIAGNOSIS — I10 Essential (primary) hypertension: Secondary | ICD-10-CM | POA: Diagnosis not present

## 2016-02-24 MED ORDER — MEMANTINE HCL ER 7 & 14 & 21 &28 MG PO CP24: ORAL_CAPSULE | ORAL | 0 refills | Status: DC

## 2016-02-24 MED ORDER — DONEPEZIL HCL 10 MG PO TABS: 10.0000 mg | ORAL_TABLET | Freq: Every day | ORAL | 3 refills | Status: DC
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
## 2016-02-24 NOTE — Patient Instructions (Addendum)
1.  Continue donepezil 10mg  at bedtime 2.  We will start memantine ER starter pack.  Take as directed to goal of 28mg  daily.  Contact us when you need refill. 3.  No driving 4.  I would recommend having your brother and sister-in-law review your finances. 5.  I agree that you should look into an assisted living facility.  Here are some RESOURCES: Development worker, community of [facility]: [phone]  Tel [place]:  [phone]  www.senior-resources-[place].org/resources.cfm   Resources for common questions found under "Pathways & Protocols "  www.senior-resources-[place].org/pathways/Pathways_Menu.htm   Resources for [facility] and Assisted Living facilities:  www.[facility]guide.com   For assistance with senior care, elder law, and estate planning (POA, medical directives):  [employer]  [address], [place] [address]  Tel: [phone]  [email or web address]   Berneice Heinrich  Tel: [phone]  [email or web address]

## 2016-02-29 DIAGNOSIS — G4733 Obstructive sleep apnea (adult) (pediatric): Secondary | ICD-10-CM | POA: Diagnosis not present

## 2016-03-14 ENCOUNTER — Telehealth: Payer: Self-pay | Admitting: Neurology

## 2016-03-14 NOTE — Telephone Encounter (Signed)
I have no objection to Imodium.

## 2016-03-14 NOTE — Telephone Encounter (Signed)
Is it ok to take?

## 2016-03-14 NOTE — Telephone Encounter (Signed)
Nancy Cole March 08, 2034. Her # [phone] [id number]. She called regarding her having diarrhea and taking imodium. Her PCP Dr. Virgina Jock told her to check with Dr. Tomi Likens before taking it. Thank you

## 2016-03-14 NOTE — Telephone Encounter (Signed)
Left message informing patient.

## 2016-03-26 ENCOUNTER — Telehealth: Payer: Self-pay | Admitting: Neurology

## 2016-03-26 MED ORDER — MEMANTINE HCL ER 28 MG PO CP24: 28.0000 mg | ORAL_CAPSULE | Freq: Every day | ORAL | 3 refills | Status: DC

## 2016-03-26 NOTE — Telephone Encounter (Signed)
Nancy Cole 10-22-2034. Her # [phone] [id number]. Today is the last day of the titration pack for her Dementia medication. She wanted to know if Dr.Jaffe was going to call in another refill? [facility] uses [facility]. Thank you

## 2016-03-26 NOTE — Telephone Encounter (Signed)
Pls send Rx for Namenda XR 28mg  daily, #90 with 3 refills, thanks

## 2016-03-30 DIAGNOSIS — H5213 Myopia, bilateral: Secondary | ICD-10-CM | POA: Diagnosis not present

## 2016-03-30 DIAGNOSIS — H16102 Unspecified superficial keratitis, left eye: Secondary | ICD-10-CM | POA: Diagnosis not present

## 2016-03-30 DIAGNOSIS — H43813 Vitreous degeneration, bilateral: Secondary | ICD-10-CM | POA: Diagnosis not present

## 2016-03-30 DIAGNOSIS — Z961 Presence of intraocular lens: Secondary | ICD-10-CM | POA: Diagnosis not present

## 2016-04-18 DIAGNOSIS — G4733 Obstructive sleep apnea (adult) (pediatric): Secondary | ICD-10-CM | POA: Diagnosis not present

## 2016-05-22 DIAGNOSIS — F3341 Major depressive disorder, recurrent, in partial remission: Secondary | ICD-10-CM | POA: Diagnosis not present

## 2016-05-22 DIAGNOSIS — Z8679 Personal history of other diseases of the circulatory system: Secondary | ICD-10-CM | POA: Diagnosis not present

## 2016-05-22 DIAGNOSIS — E038 Other specified hypothyroidism: Secondary | ICD-10-CM | POA: Diagnosis not present

## 2016-05-22 DIAGNOSIS — F0390 Unspecified dementia without behavioral disturbance: Secondary | ICD-10-CM | POA: Diagnosis not present

## 2016-05-22 DIAGNOSIS — M545 Low back pain: Secondary | ICD-10-CM | POA: Diagnosis not present

## 2016-05-22 DIAGNOSIS — G4733 Obstructive sleep apnea (adult) (pediatric): Secondary | ICD-10-CM | POA: Diagnosis not present

## 2016-05-22 DIAGNOSIS — J449 Chronic obstructive pulmonary disease, unspecified: Secondary | ICD-10-CM | POA: Diagnosis not present

## 2016-05-22 DIAGNOSIS — I10 Essential (primary) hypertension: Secondary | ICD-10-CM | POA: Diagnosis not present

## 2016-05-22 DIAGNOSIS — R627 Adult failure to thrive: Secondary | ICD-10-CM | POA: Diagnosis not present

## 2016-06-15 ENCOUNTER — Ambulatory Visit: Payer: Medicare HMO | Admitting: Internal Medicine

## 2016-08-10 DIAGNOSIS — G4730 Sleep apnea, unspecified: Secondary | ICD-10-CM | POA: Diagnosis not present

## 2016-08-10 DIAGNOSIS — G4733 Obstructive sleep apnea (adult) (pediatric): Secondary | ICD-10-CM | POA: Diagnosis not present

## 2016-09-20 ENCOUNTER — Encounter: Payer: Self-pay | Admitting: Internal Medicine

## 2016-09-20 ENCOUNTER — Ambulatory Visit (INDEPENDENT_AMBULATORY_CARE_PROVIDER_SITE_OTHER): Payer: Medicare HMO | Admitting: Internal Medicine

## 2016-09-20 VITALS — BP 110/64 | HR 63 | Ht 60.5 in | Wt 156.2 lb

## 2016-09-20 DIAGNOSIS — G4733 Obstructive sleep apnea (adult) (pediatric): Secondary | ICD-10-CM

## 2016-09-20 DIAGNOSIS — I10 Essential (primary) hypertension: Secondary | ICD-10-CM | POA: Diagnosis not present

## 2016-09-20 DIAGNOSIS — F3341 Major depressive disorder, recurrent, in partial remission: Secondary | ICD-10-CM | POA: Diagnosis not present

## 2016-09-20 DIAGNOSIS — F0390 Unspecified dementia without behavioral disturbance: Secondary | ICD-10-CM | POA: Diagnosis not present

## 2016-09-20 DIAGNOSIS — R627 Adult failure to thrive: Secondary | ICD-10-CM | POA: Diagnosis not present

## 2016-09-20 DIAGNOSIS — Z8679 Personal history of other diseases of the circulatory system: Secondary | ICD-10-CM | POA: Diagnosis not present

## 2016-09-20 DIAGNOSIS — J449 Chronic obstructive pulmonary disease, unspecified: Secondary | ICD-10-CM | POA: Diagnosis not present

## 2016-09-20 DIAGNOSIS — E038 Other specified hypothyroidism: Secondary | ICD-10-CM | POA: Diagnosis not present

## 2016-09-20 DIAGNOSIS — Z23 Encounter for immunization: Secondary | ICD-10-CM | POA: Diagnosis not present

## 2016-09-20 DIAGNOSIS — Z8719 Personal history of other diseases of the digestive system: Secondary | ICD-10-CM | POA: Diagnosis not present

## 2016-09-20 DIAGNOSIS — J300 Vasomotor rhinitis: Secondary | ICD-10-CM

## 2016-09-20 MED ORDER — IPRATROPIUM BROMIDE 0.06 % NA SOLN: NASAL | 12 refills | Status: DC

## 2016-09-20 MED ORDER — ALBUTEROL SULFATE HFA 108 (90 BASE) MCG/ACT IN AERS: 2.0000 | INHALATION_SPRAY | Freq: Four times a day (QID) | RESPIRATORY_TRACT | 12 refills | Status: DC | PRN

## 2016-09-20 NOTE — Patient Instructions (Addendum)
Script sent refilling albuterol rescue inhaler  Script sent for ipratropium nasal spray- use as needed for runny nose  Order- DME [facility]- please replace old CPAP machine, auto 5-15, mask of choice, humidifier, supplies, AirView    Dx OSA  Don't forget your flu shot this fall   Please call if we can help

## 2016-09-20 NOTE — Progress Notes (Signed)
Patient ID: Nancy Cole, female    DOB: Nov 19, 1934, 82 y.o.   MRN: [id number]  HPI female never smoker followed for asthma/fixed obstructive airways disease, OSA, rhinitis NPSG 01/19/04-  Moderately severe obstructive sleep apnea/hypopnea syndrome, RDI 39.8 per hour with oxygen desaturation to 76%, body weight 151 pounds  CPAP titration to 9 CWP Walk Test room air 02/16/15- no desaturation below 91%. PFT 06/16/2015-moderate obstructive airways disease-FVC 2.00/90%, FEV1 1.01/61%, FEV1/FVC 0.68, TLC 78%, DLCO 75%. Insignificant response to bronchodilator --------------------------------------------------------------------------------------------  02/16/2015-82 year old female never smoker, followed for asthma/fixed obstructive airways disease, OSA CPAP 9/ [facility] FOLLOWS FOR: DME: [facility]; no recent DL at this time. Wears CPAP most every night for 4-8 hours. No new supplies needed at this time. Pt states she feels like she runs out of O2 even when her O2 sats are normal. Some recent dry cough. Chronic "dripping" of eyes and watery nose. Just doesn't feel well but nothing specific. Frequent nocturia affects sleep. Using pro air several times daily. Easy dyspnea on exertion probably not much changed. Walk Test room air 2/8- no desaturation below 91%.  06/16/2015-82 year old female never smoker followed for asthma/fixed obstructive airways disease, OSA, rhinitis CPAP 9/[facility] PFT 06/16/2015-moderate obstructive airways disease-FVC 2.00/90%, FEV1 1.01/61%, FEV1/FVC 0.68, TLC 78%, DLCO 75%. Insignificant response to bronchodilator CXR 02/16/2015-IMPRESSION: Stable mild bibasilar scarring versus atelectasis. No acute cardiopulmonary disease. She continues to notice stable dyspnea on exertion without chest pain, palpitation or peripheral edema  09/20/16- 82 year old female never smoker followed for asthma/fixed obstructive airways disease, OSA, rhinitis CPAP 9/[facility]>  Replace machine, auto 5-15  today FOLLOWS FOR: DME: [facility]. Pt wears CPAP nighlty and no new supplies needed at this time. Pt states she is not resting very well-pt is not sure why.  Anoro, albuterol HFA, Singulair Download compliance 96%, averaging over 7 hours per night with AHI 2.9/hour. Humidity bothers her breathing. Dyspnea on exertion is stable. She is not active and stays indoors most of the time. No acute respiratory events. Only occasional use of rescue inhaler. No recent infections. Bothersome watery postnasal drip with no real pattern.  Review of Systems-See HPI  + = positive Constitutional:   No-   weight loss, night sweats, fevers, chills, fatigue, lassitude. HEENT:   No-  headaches, difficulty swallowing, tooth/dental problems, sore throat,       No-  sneezing, itching, ear ache, nasal congestion, + post nasal drip,  CV:  No-   chest pain, orthopnea, PND, swelling in lower extremities, anasarca, dizziness, palpitations Resp: + shortness of breath with exertion or at rest.              No-   productive cough,  No non-productive cough,  No-  coughing up of blood.              No-   change in color of mucus.  No- wheezing.   Skin: No-   rash or lesions. GI:  No-   heartburn, indigestion, abdominal pain, nausea, vomiting,  GU: . MS:  No-   joint pain or swelling.   Neuro- nothing unusual/note hospital workup  Psych:  No- change in mood or affect. No depression or anxiety.  No memory loss.  Objective:   Physical Exam General- Alert, Oriented, Affect-appropriate, Distress- none acute. Elderly.  Skin- rash-none, lesions- none, excoriation- none Lymphadenopathy- none Head- atraumatic            Eyes- Gross vision intact, PERRLA, conjunctivae clear secretions  Ears- Hearing, canals-normal            Nose- Clear, no-Septal dev, mucus, polyps, erosion, perforation             Throat- Mallampati III , mucosa + thrush , drainage- none, tonsils- atrophic, + partial plate  Neck- flexible , trachea  midline, no stridor , thyroid nl, carotid no bruit Chest - symmetrical excursion , unlabored           Heart/CV- RRR , no murmur , no gallop  , no rub, nl s1 s2                           - JVD- none , edema- none, stasis changes- none, varices- none           Lung- clear to P&A, wheeze- none, cough- none , dullness-none, rub- none           Chest wall-  Abd- Br/ Gen/ Rectal- Not done, not indicated Extrem- cyanosis- none, clubbing, none, atrophy- none, strength- nl Neuro- grossly intact to observation Assessment & Plan:

## 2016-09-21 NOTE — Assessment & Plan Note (Signed)
At her age this is probably vasomotor rhinitis. Plan-try Atrovent nasal spray 0.06%

## 2016-09-21 NOTE — Assessment & Plan Note (Signed)
She still benefits from CPAP and continues with good compliance and control. Her machine is quite old. If eligible we will ask for replacement, changing to AutoPap 5-15.

## 2016-09-21 NOTE — Assessment & Plan Note (Signed)
She has been under good control with no acute events, avoiding respiratory infections. Exline plan-refill rescue inhaler with discussion.

## 2016-10-24 DIAGNOSIS — R1031 Right lower quadrant pain: Secondary | ICD-10-CM | POA: Diagnosis not present

## 2016-10-24 DIAGNOSIS — Z6831 Body mass index (BMI) 31.0-31.9, adult: Secondary | ICD-10-CM | POA: Diagnosis not present

## 2016-10-24 DIAGNOSIS — I10 Essential (primary) hypertension: Secondary | ICD-10-CM | POA: Diagnosis not present

## 2016-10-24 DIAGNOSIS — M1990 Unspecified osteoarthritis, unspecified site: Secondary | ICD-10-CM | POA: Diagnosis not present

## 2016-11-26 ENCOUNTER — Ambulatory Visit: Payer: Commercial Managed Care - HMO | Admitting: Neurology

## 2017-02-26 DIAGNOSIS — E038 Other specified hypothyroidism: Secondary | ICD-10-CM | POA: Diagnosis not present

## 2017-02-26 DIAGNOSIS — M859 Disorder of bone density and structure, unspecified: Secondary | ICD-10-CM | POA: Diagnosis not present

## 2017-02-26 DIAGNOSIS — R82998 Other abnormal findings in urine: Secondary | ICD-10-CM | POA: Diagnosis not present

## 2017-02-26 DIAGNOSIS — I10 Essential (primary) hypertension: Secondary | ICD-10-CM | POA: Diagnosis not present

## 2017-02-26 DIAGNOSIS — E7849 Other hyperlipidemia: Secondary | ICD-10-CM | POA: Diagnosis not present

## 2017-03-07 DIAGNOSIS — F0390 Unspecified dementia without behavioral disturbance: Secondary | ICD-10-CM | POA: Diagnosis not present

## 2017-03-07 DIAGNOSIS — Z8679 Personal history of other diseases of the circulatory system: Secondary | ICD-10-CM | POA: Diagnosis not present

## 2017-03-07 DIAGNOSIS — Z1212 Encounter for screening for malignant neoplasm of rectum: Secondary | ICD-10-CM | POA: Diagnosis not present

## 2017-03-07 DIAGNOSIS — E7849 Other hyperlipidemia: Secondary | ICD-10-CM | POA: Diagnosis not present

## 2017-03-07 DIAGNOSIS — F3341 Major depressive disorder, recurrent, in partial remission: Secondary | ICD-10-CM | POA: Diagnosis not present

## 2017-03-07 DIAGNOSIS — I10 Essential (primary) hypertension: Secondary | ICD-10-CM | POA: Diagnosis not present

## 2017-03-07 DIAGNOSIS — R808 Other proteinuria: Secondary | ICD-10-CM | POA: Diagnosis not present

## 2017-03-07 DIAGNOSIS — I77810 Thoracic aortic ectasia: Secondary | ICD-10-CM | POA: Diagnosis not present

## 2017-03-07 DIAGNOSIS — Z0000 Encounter for general adult medical examination without abnormal findings: Secondary | ICD-10-CM | POA: Diagnosis not present

## 2017-03-07 DIAGNOSIS — J449 Chronic obstructive pulmonary disease, unspecified: Secondary | ICD-10-CM | POA: Diagnosis not present

## 2017-03-18 ENCOUNTER — Other Ambulatory Visit: Payer: Self-pay | Admitting: Neurology

## 2017-03-20 ENCOUNTER — Other Ambulatory Visit: Payer: Self-pay

## 2017-03-20 ENCOUNTER — Other Ambulatory Visit: Payer: Self-pay | Admitting: *Deleted

## 2017-03-20 ENCOUNTER — Telehealth: Payer: Self-pay | Admitting: Neurology

## 2017-03-20 DIAGNOSIS — M859 Disorder of bone density and structure, unspecified: Secondary | ICD-10-CM | POA: Diagnosis not present

## 2017-03-20 MED ORDER — MEMANTINE HCL ER 28 MG PO CP24: 28.0000 mg | ORAL_CAPSULE | Freq: Every day | ORAL | 3 refills | Status: DC

## 2017-03-20 NOTE — Telephone Encounter (Signed)
Rx refilled to [name]. Please also let brother Nancy Cole know of any future appts made for patient.

## 2017-03-20 NOTE — Telephone Encounter (Signed)
Memantine 28 mg Rx refilled to [facility]

## 2017-03-20 NOTE — Telephone Encounter (Signed)
[name] pt's brother called and wanted to know if pt's prescription for her memory can be sent to [facility] mail order for a 90 day supply instead of the 30 day supply, he did say he has medical power of attorney

## 2017-03-27 ENCOUNTER — Other Ambulatory Visit: Payer: Self-pay | Admitting: Neurology

## 2017-04-12 ENCOUNTER — Emergency Department (HOSPITAL_COMMUNITY)
Admission: EM | Admit: 2017-04-12 | Discharge: 2017-04-13 | Disposition: A | Discharge status: Home / Self Care | Payer: Medicare HMO | Attending: Emergency Medicine | Admitting: Emergency Medicine

## 2017-04-12 ENCOUNTER — Emergency Department (HOSPITAL_COMMUNITY): Payer: Medicare HMO

## 2017-04-12 ENCOUNTER — Other Ambulatory Visit: Payer: Self-pay

## 2017-04-12 ENCOUNTER — Encounter (HOSPITAL_COMMUNITY): Payer: Self-pay | Admitting: Emergency Medicine

## 2017-04-12 DIAGNOSIS — J45909 Unspecified asthma, uncomplicated: Secondary | ICD-10-CM | POA: Diagnosis not present

## 2017-04-12 DIAGNOSIS — S6992XA Unspecified injury of left wrist, hand and finger(s), initial encounter: Secondary | ICD-10-CM | POA: Diagnosis present

## 2017-04-12 DIAGNOSIS — Z7982 Long term (current) use of aspirin: Secondary | ICD-10-CM | POA: Insufficient documentation

## 2017-04-12 DIAGNOSIS — Y939 Activity, unspecified: Secondary | ICD-10-CM | POA: Insufficient documentation

## 2017-04-12 DIAGNOSIS — S52501A Unspecified fracture of the lower end of right radius, initial encounter for closed fracture: Secondary | ICD-10-CM | POA: Insufficient documentation

## 2017-04-12 DIAGNOSIS — Y929 Unspecified place or not applicable: Secondary | ICD-10-CM | POA: Insufficient documentation

## 2017-04-12 DIAGNOSIS — F0390 Unspecified dementia without behavioral disturbance: Secondary | ICD-10-CM | POA: Diagnosis not present

## 2017-04-12 DIAGNOSIS — Z79899 Other long term (current) drug therapy: Secondary | ICD-10-CM | POA: Diagnosis not present

## 2017-04-12 DIAGNOSIS — Y33XXXA Other specified events, undetermined intent, initial encounter: Secondary | ICD-10-CM | POA: Diagnosis not present

## 2017-04-12 DIAGNOSIS — Y998 Other external cause status: Secondary | ICD-10-CM | POA: Diagnosis not present

## 2017-04-12 DIAGNOSIS — S52592A Other fractures of lower end of left radius, initial encounter for closed fracture: Secondary | ICD-10-CM | POA: Diagnosis not present

## 2017-04-12 DIAGNOSIS — S52325A Nondisplaced transverse fracture of shaft of left radius, initial encounter for closed fracture: Secondary | ICD-10-CM | POA: Diagnosis not present

## 2017-04-12 MED ORDER — HYDROCODONE-ACETAMINOPHEN 5-325 MG PO TABS: 1.0000 | ORAL_TABLET | Freq: Four times a day (QID) | ORAL | 0 refills | Status: DC | PRN

## 2017-04-12 MED ORDER — ONDANSETRON HCL 4 MG PO TABS
4.0000 mg | ORAL_TABLET | Freq: Once | ORAL | Status: AC
  Administered 2017-04-12: 4 mg via ORAL
  Filled 2017-04-12: qty 1

## 2017-04-12 MED ORDER — HYDROCODONE-ACETAMINOPHEN 5-325 MG PO TABS
1.0000 | ORAL_TABLET | Freq: Once | ORAL | Status: AC
  Administered 2017-04-12: 1 via ORAL
  Filled 2017-04-12: qty 1

## 2017-04-12 MED ORDER — MELOXICAM 7.5 MG PO TABS: 7.5000 mg | ORAL_TABLET | Freq: Every day | ORAL | 0 refills | Status: DC

## 2017-04-12 NOTE — ED Triage Notes (Signed)
Pt states she has left wrist swelling. Pt states that this started on Monday. Pt denies any injury or fall.

## 2017-04-12 NOTE — Discharge Instructions (Addendum)
You have a fracture of a bone in your wrist call the radius.  You have been placed in a splint and given a sling to use.  Please use extra strength Tylenol for mild pain, use Norco for more severe pain.  Norco may cause drowsiness. Mobic 7.5mg  daily with a meal.  Please use caution when getting around after taking Norco.  Please see the hand specialist suggested by Dr. Virgina Jock on Wednesday, April 10 as scheduled.  Please keep your wrist elevated as much as possible.

## 2017-04-12 NOTE — ED Provider Notes (Signed)
[facility] EMERGENCY DEPARTMENT Provider Note   CSN: [id number] Arrival date & time: 04/12/17  2201     History   Chief Complaint Chief Complaint  Patient presents with  . Wrist Pain    HPI Nancy Cole is a 83 y.o. female.  Pt reports reports pain for over a week. She noted redness recently. No reported fall or injury. No fever or chills. No operations or procedures involving the wrist.   Wrist Pain  This is a new problem. The current episode started more than 2 days ago. The problem occurs hourly. The problem has been gradually worsening. Pertinent negatives include no chest pain, no abdominal pain, no headaches and no shortness of breath. The symptoms are aggravated by bending (movement). Nothing relieves the symptoms. She has tried nothing for the symptoms.    Past Medical History:  Diagnosis Date  . Asthma   . COPD (chronic obstructive pulmonary disease) ([place])    PFT 2007 FEV1/FVC 43%  PFT 01/12/10 FEV1 1.0/51%, FVC 2.0/79%, FEV1/FVC 0.48  . DDD (degenerative disc disease)   . Diverticulitis   . OSA on CPAP   . Osteoarthritis   . Seasonal allergies     Patient Active Problem List   Diagnosis Date Noted  . Dementia 05/21/2014  . 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 Cerebrovascular disease 05/21/2014  . Numbness 10/23/2011  . Candidiasis 04/29/2011  . Dehydration 04/25/2011  . Hypotension 04/25/2011  . Pyelonephritis 04/25/2011  . Urosepsis 04/25/2011  . Azotemia 04/25/2011  . Leukocytosis 04/25/2011  . Rhinitis 04/13/2010  . THRUSH 09/28/2009  . DIARRHEA 03/31/2009  . DYSPNEA 12/30/2008  . ADENOCARCINOMA, COLON 02/13/2008  . Obstructive sleep apnea 01/28/2007  . Asthma with COPD ([place]) 01/28/2007  . OSTEOARTHRITIS 01/28/2007    Past Surgical History:  Procedure Laterality Date  . APPENDECTOMY    . CATARACT EXTRACTION, BILATERAL    . CHOLECYSTECTOMY    . PARTIAL COLECTOMY  2009   for diverticulitis  . PLEURAL SCARIFICATION  1979   recurrent  spontaneuous--L--- finally had tac pleurodesis  . TOTAL KNEE ARTHROPLASTY     L     OB History   None      Home Medications    Prior to Admission medications   Medication Sig Start Date End Date Taking? Authorizing Provider  acetaminophen (TYLENOL) 500 MG tablet Take 1,000 mg by mouth every 6 (six) hours as needed for mild pain or moderate pain.    Yes [provider]  albuterol (PROVENTIL HFA;VENTOLIN HFA) 108 (90 Base) MCG/ACT inhaler Inhale 2 puffs into the lungs every 6 (six) hours as needed for wheezing or shortness of breath. 09/20/16  Yes Deneise Lever, MD  ANORO ELLIPTA 62.5-25 MCG/INH AEPB INHALE 1 PUFF EVERY DAY 02/07/16  Yes Baird Lyons D, MD  aspirin 81 MG tablet Take 81 mg by mouth daily.   Yes [provider]  atorvastatin (LIPITOR) 20 MG tablet Take 20 mg by mouth every morning.  01/20/15  Yes [provider]  cetirizine (ZYRTEC) 10 MG tablet Take 10 mg by mouth every morning.    Yes [provider]  Cholecalciferol (VITAMIN D) 2000 UNITS CAPS Take 1 capsule by mouth at bedtime.    Yes [provider]  donepezil (ARICEPT) 10 MG tablet TAKE 1 TABLET AT BEDTIME 03/27/17  Yes Jaffe, Adam R, DO  levothyroxine (SYNTHROID, LEVOTHROID) 100 MCG tablet Take 100 mcg by mouth daily.     Yes [provider]  memantine (NAMENDA XR) 28 MG  CP24 24 hr capsule Take 1 capsule (28 mg total) by mouth daily. 03/20/17  Yes Jaffe, Adam R, DO  montelukast (SINGULAIR) 10 MG tablet TAKE 1 TABLET AT BEDTIME   Yes Young, Clinton D, MD  potassium chloride (K-DUR,KLOR-CON) 10 MEQ tablet Take 20 mEq by mouth 2 (two) times daily.  09/14/10  Yes [provider]  sertraline (ZOLOFT) 100 MG tablet Take 100 mg by mouth daily.   Yes [provider]    Family History Family History  Problem Relation Age of Onset  . COPD Father        heavy smoker  . Heart attack Mother   . COPD Brother        smoker    Social History Social  History   Tobacco Use  . Smoking status: Never Smoker  . Smokeless tobacco: Never Used  Substance Use Topics  . Alcohol use: No    Alcohol/week: 0.0 oz  . Drug use: No     Allergies   Sulfamethoxazole and Penicillins   Review of Systems Review of Systems  Constitutional: Negative for activity change.       All ROS Neg except as noted in HPI  HENT: Negative for nosebleeds.   Eyes: Negative for photophobia and discharge.  Respiratory: Negative for cough, shortness of breath and wheezing.   Cardiovascular: Negative for chest pain and palpitations.  Gastrointestinal: Negative for abdominal pain and blood in stool.  Genitourinary: Negative for dysuria, frequency and hematuria.  Musculoskeletal: Positive for arthralgias. Negative for back pain and neck pain.       Wrist pain  Skin: Negative.   Neurological: Negative for dizziness, seizures, speech difficulty and headaches.  Psychiatric/Behavioral: Negative for confusion and hallucinations.     Physical Exam Updated Vital Signs BP (!) 163/91 (BP Location: Right Arm)   Pulse 60   Temp 98.1 F (36.7 C) (Oral)   Resp 18   SpO2 97%   Physical Exam  Constitutional: She is oriented to person, place, and time. She appears well-developed and well-nourished.  Non-toxic appearance.  HENT:  Head: Normocephalic.  Right Ear: Tympanic membrane and external ear normal.  Left Ear: Tympanic membrane and external ear normal.  Eyes: Pupils are equal, round, and reactive to light. EOM and lids are normal.  Neck: Normal range of motion. Neck supple. Carotid bruit is not present.  Cardiovascular: Normal rate, regular rhythm, normal heart sounds, intact distal pulses and normal pulses.  Pulmonary/Chest: Breath sounds normal. No respiratory distress.  Abdominal: Soft. Bowel sounds are normal. There is no tenderness. There is no guarding.  Musculoskeletal: She exhibits tenderness.       Left shoulder: Normal.       Left elbow: Normal.        Left wrist: She exhibits decreased range of motion, tenderness and deformity.  The left wrist is warm, but not hot. No red streaking. Cap refill is less than 2 sec. Radial pulse on the left is 2+.  Lymphadenopathy:       Head (right side): No submandibular adenopathy present.       Head (left side): No submandibular adenopathy present.    She has no cervical adenopathy.  Neurological: She is alert and oriented to person, place, and time. She has normal strength. No cranial nerve deficit or sensory deficit.  Skin: Skin is warm and dry.  Psychiatric: She has a normal mood and affect. Her speech is normal.  Nursing note and vitals reviewed.    ED Treatments /  Results  Labs (all labs ordered are listed, but only abnormal results are displayed) Labs Reviewed - No data to display  EKG None  Radiology Dg Wrist Complete Left  Result Date: 04/12/2017 CLINICAL DATA:  General left wrist pain and swelling for the past week. No injury. EXAM: LEFT WRIST - COMPLETE 3+ VIEW COMPARISON:  None. FINDINGS: There is a nondisplaced fracture through the distal radius. Severe osteoarthritis of the first CMC and second MCP joints. Diffuse osteopenia. Chondrocalcinosis of the TFCC. IMPRESSION: 1. Acute, nondisplaced transverse fracture through the distal radius. No definite intra-articular extension. Electronically Signed   By: Titus Dubin M.D.   On: 04/12/2017 22:47    Procedures FRACTURE CARE.  Marland KitchenSplint Application Date/Time: 01/13/1094 11:44 PM Performed by: Lily Kocher, PA-C Authorized by: Lily Kocher, PA-C   Consent:    Consent obtained:  Verbal   Consent given by:  Patient   Risks discussed:  Numbness, pain and swelling Pre-procedure details:    Sensation:  Normal   Skin color:  Normal Procedure details:    Laterality:  Left   Location:  Wrist   Splint type:  Sugar tong   Supplies:  Cotton padding, Ortho-Glass and sling Post-procedure details:    Pain:  Unchanged   Sensation:   Normal   Skin color:  Normal   Patient tolerance of procedure:  Tolerated well, no immediate complications   (including critical care time)  Medications Ordered in ED Medications - No data to display   Initial Impression / Assessment and Plan / ED Course  I have reviewed the triage vital signs and the nursing notes.  Pertinent labs & imaging results that were available during my care of the patient were reviewed by me and considered in my medical decision making (see chart for details).      Final Clinical Impressions(s) / ED Diagnoses MDM  Blood pressure is elevated at 163/91, otherwise vital signs are within normal limits.  Pulse oximetry is 97% on room air.  Within normal limits by my interpretation.  Patient does not recall any recent falls or injuries.  She has not had any operations or procedures involving the left or the right upper extremity.  She denies any recent fever or chills to be reported.  There are degenerative joint disease changes at multiple sites.  The radial pulse on the left is 2+ and the capillary refill is less than 2 seconds.  There is significant swelling at the distal radial area.  There is some increased redness present.  But no red streaking noted.  Splint applied and sling provided for the patient.  The patient will use 7.5 mg of Mobic daily with a meal.  She will use Tylenol extra strength for mild pain, she will use Norco for more severe pain.  Her primary physician Dr. Virgina Jock has arranged an appointment with hand specialist on Wednesday, April 10.  I have asked the patient to return to the emergency department for evaluation if anything happens that this appointment does not take place.  Patient is in agreement with this plan.   Final diagnoses:  Closed fracture of distal end of right radius, unspecified fracture morphology, initial encounter    ED Discharge Orders        Ordered    HYDROcodone-acetaminophen (NORCO/VICODIN) 5-325 MG tablet  Every 6  hours PRN     04/12/17 2334    meloxicam (MOBIC) 7.5 MG tablet  Daily     04/12/17 2340  Lily Kocher, PA-C 04/12/17 2353    Merrily Pew, MD 04/13/17 [phone]

## 2017-04-12 NOTE — ED Provider Notes (Signed)
Medical screening examination/treatment/procedure(s) were conducted as a shared visit with non-physician practitioner(s) and myself.  I personally evaluated the patient during the encounter.  83-year-old female here with left wrist pain.  Patient states her wrist hurts from about the middle of her metacarpals up to about the middle of her radius and ulna but it is the worst over the radial head area.  States it is difficult to pick things up or get dressed without severe pain.  Does not remember any falls or injuries.  My exam she has significant swelling of her left wrist, erythema and slight warmth.  She can move it okay.  Move all her fingers okay.  Pulses intact.  No bruising or obvious injury. X-rays consistent with a radial fracture.  Kind of odd that she does not remember any injury or trauma but on my review of the x-ray it definitely does look like a fracture as there.  It does look like he started to heal some some may be subacute.  Also consider possible cellulitis of her wrist but in this situation could also just be inflammatory in nature from the healing process.  We will ask her to follow with a primary doctor or hand in the next few days otherwise pain control.  We will not start antibiotics at this time.   Motty Borin, Corene Cornea, MD 04/13/17 [phone]

## 2017-04-15 DIAGNOSIS — S52572A Other intraarticular fracture of lower end of left radius, initial encounter for closed fracture: Secondary | ICD-10-CM | POA: Diagnosis not present

## 2017-04-22 DIAGNOSIS — S52572D Other intraarticular fracture of lower end of left radius, subsequent encounter for closed fracture with routine healing: Secondary | ICD-10-CM | POA: Diagnosis not present

## 2017-04-22 DIAGNOSIS — S52572A Other intraarticular fracture of lower end of left radius, initial encounter for closed fracture: Secondary | ICD-10-CM | POA: Diagnosis not present

## 2017-05-01 DIAGNOSIS — S52572D Other intraarticular fracture of lower end of left radius, subsequent encounter for closed fracture with routine healing: Secondary | ICD-10-CM | POA: Diagnosis not present

## 2017-05-03 ENCOUNTER — Encounter: Payer: Self-pay | Admitting: Neurology

## 2017-05-22 DIAGNOSIS — S52572D Other intraarticular fracture of lower end of left radius, subsequent encounter for closed fracture with routine healing: Secondary | ICD-10-CM | POA: Diagnosis not present

## 2017-06-14 ENCOUNTER — Ambulatory Visit (INDEPENDENT_AMBULATORY_CARE_PROVIDER_SITE_OTHER): Payer: Medicare HMO | Admitting: Neurology

## 2017-06-14 ENCOUNTER — Encounter: Payer: Self-pay | Admitting: Neurology

## 2017-06-14 VITALS — BP 122/82 | HR 78 | Ht 60.6 in | Wt 142.0 lb

## 2017-06-14 DIAGNOSIS — F0150 Vascular dementia without behavioral disturbance: Secondary | ICD-10-CM | POA: Diagnosis not present

## 2017-06-14 DIAGNOSIS — F0280 Dementia in other diseases classified elsewhere without behavioral disturbance: Secondary | ICD-10-CM

## 2017-06-14 DIAGNOSIS — G309 Alzheimer's disease, unspecified: Secondary | ICD-10-CM

## 2017-06-14 NOTE — Patient Instructions (Signed)
1.  Continue donepezil and memantine 2.  Continue aspirin, atorvastatin and blood pressure medication 3.  Good luck at your new home.  Try to remain social and interactive with people 4.  Follow up in one year or as needed.

## 2017-06-14 NOTE — Progress Notes (Signed)
NEUROLOGY FOLLOW UP OFFICE NOTE  Nancy Cole [id number]  HISTORY OF PRESENT ILLNESS: Nancy Cole is an 83 year old right-handed woman with asthma, COPD, hypertension, hypothyroidism, OSA, left macular degeneration and history of TIAs who follows up for mixed Alzheimer's and vascular dementia.   UPDATE: She is taking Aricept 10mg  daily and Namenda XR 28mg  daily. She will be soon moving into [facility] assisted living in [place].  She already put in a deposit.  Her best friend recently moved there.  She continues to pay her own bills.  She manages her medications herself with the help of a pillbox.    HISTORY: She reports problems with memory, gradually worse over the past several years.  She reports difficulty remembering names of people she knows.  She also has gotten lost while driving on familiar routes.  She no longer drives.  She also started making mistakes when paying the bills.  She feels disorganized and has trouble focusing.  She denies family history of dementia.  She lives by herself but her brother and sister-in-law live across the street and she sees them daily.  Her brother is her POA.   MRI of brain without contrast from 06/09/14 showed diffuse atrophy and small vessel ischemic changes, including remote lacunar infarcts in both basal ganglia and cerebellum, as well as possible occlusion of left vertebral artery.  B12 was 881.  Carotid doppler from 11/26/14 showed no hemodynamically significant ICA stenosis but demonstrated nonvisualization of left vertebral artery flow.   She was evaluated by Dr. Erling Cruz in 2009 for blackouts.  At that time, she was in her car in the driveway and the next thing she knew, she was in the car after knocking down the fence in the backyard.  There was no preceding aura, tunnel vision or lightheadedness.  She was not confused when she woke up.  An MRI of the brain performed 08/07/07 reportedly showed chronic small vessel disease in the  periventricular white matter and cerebellum.  MRA of the head and neck performed on 09/09/07 showed a diminutive left vertebral artery with dominant right vertebral artery and without intracranial stenosis or carotid stenosis.     She has history of several TIAs, including one in October 2013.  She presented with left sided paresthesias and numbness.  MRI of the brain revealed no acute infarct, but again showed extensive white matter disease with chronic lacunes and probable occluded left vertebral artery.  Carotid doppler revealed 40-59% right ICA stenosis based on velocities, thought to be due to tortuosity.  Transcranial doppler was normal.  2D echo showed EF 60-65% with grade 1 diastolic dysfunction.  She underwent a driving evaluation with OT at [facility] on 10/12/15.  She exhibited deficits in cognitive function required for safe independent driving, such as functional memory, problem-solving, attention, concentration and decision-making skills.  Therefore, it was recommended that she cease all driving.    PAST MEDICAL HISTORY: Past Medical History:  Diagnosis Date  . Asthma   . COPD (chronic obstructive pulmonary disease) ([place])    PFT 2007 FEV1/FVC 43%  PFT 01/12/10 FEV1 1.0/51%, FVC 2.0/79%, FEV1/FVC 0.48  . DDD (degenerative disc disease)   . Diverticulitis   . OSA on CPAP   . Osteoarthritis   . Seasonal allergies     MEDICATIONS: Current Outpatient Medications on File Prior to Visit  Medication Sig Dispense Refill  . acetaminophen (TYLENOL) 500 MG tablet Take 1,000 mg by mouth every 6 (six) hours as  needed for mild pain or moderate pain.     Marland Kitchen albuterol (PROVENTIL HFA;VENTOLIN HFA) 108 (90 Base) MCG/ACT inhaler Inhale 2 puffs into the lungs every 6 (six) hours as needed for wheezing or shortness of breath. 1 Inhaler 12  . ANORO ELLIPTA 62.5-25 MCG/INH AEPB INHALE 1 PUFF EVERY DAY 180 each 3  . aspirin 81 MG tablet Take 81 mg by mouth daily.    Marland Kitchen atorvastatin  (LIPITOR) 20 MG tablet Take 20 mg by mouth every morning.   3  . cetirizine (ZYRTEC) 10 MG tablet Take 10 mg by mouth every morning.     . Cholecalciferol (VITAMIN D) 2000 UNITS CAPS Take 1 capsule by mouth at bedtime.     . donepezil (ARICEPT) 10 MG tablet TAKE 1 TABLET AT BEDTIME 90 tablet 3  . HYDROcodone-acetaminophen (NORCO/VICODIN) 5-325 MG tablet Take 1 tablet by mouth every 6 (six) hours as needed. 12 tablet 0  . levothyroxine (SYNTHROID, LEVOTHROID) 100 MCG tablet Take 100 mcg by mouth daily.      . meloxicam (MOBIC) 7.5 MG tablet Take 1 tablet (7.5 mg total) by mouth daily. 6 tablet 0  . memantine (NAMENDA XR) 28 MG CP24 24 hr capsule Take 1 capsule (28 mg total) by mouth daily. 90 capsule 3  . montelukast (SINGULAIR) 10 MG tablet TAKE 1 TABLET AT BEDTIME 90 tablet 0  . potassium chloride (K-DUR,KLOR-CON) 10 MEQ tablet Take 20 mEq by mouth 2 (two) times daily.     . sertraline (ZOLOFT) 100 MG tablet Take 100 mg by mouth daily.     No current facility-administered medications on file prior to visit.     ALLERGIES: Allergies  Allergen Reactions  . Sulfamethoxazole     REACTION: unknown  . Penicillins Rash    Has patient had a PCN reaction causing immediate rash, facial/tongue/throat swelling, SOB or lightheadedness with hypotension: No Has patient had a PCN reaction causing severe rash involving mucus membranes or skin necrosis: No Has patient had a PCN reaction that required hospitalization: No Has patient had a PCN reaction occurring within the last 10 years: No If all of the above answers are "NO", then may proceed with Cephalosporin use.     FAMILY HISTORY: Family History  Problem Relation Age of Onset  . COPD Father        heavy smoker  . Heart attack Mother   . COPD Brother        smoker    SOCIAL HISTORY: Social History   Socioeconomic History  . Marital status: Divorced    Spouse name: Not on file  . Number of children: Not on file  . Years of education:  Not on file  . Highest education level: Not on file  Occupational History  . 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 Financial resource strain: Not on file  . Food insecurity:    Worry: Not on file    Inability: Not on file  . Transportation needs:    Medical: Not on file    Non-medical: Not on file  Tobacco Use  . Smoking status: Never Smoker  . Smokeless tobacco: Never Used  Substance and Sexual Activity  . Alcohol use: No    Alcohol/week: 0.0 oz  . Drug use: No  . Sexual activity: Never  Lifestyle  . Physical activity:    Days per week: Not on file    Minutes per session: Not on  file  . Stress: Not on file  Relationships  . Social connections:    Talks on phone: Not on file    Gets together: Not on file    Attends religious service: Not on file    Active member of club or organization: Not on file    Attends meetings of clubs or organizations: Not on file    Relationship status: Not on file  . Intimate partner violence:    Fear of current or ex partner: Not on file    Emotionally abused: Not on file    Physically abused: Not on file    Forced sexual activity: Not on file  Other Topics Concern  . Not on file  Social History Narrative   Positive history of passive tobacco smoke exposure-significant exposure to parents, heavy smokers.     REVIEW OF SYSTEMS: Constitutional: No fevers, chills, or sweats, no generalized fatigue, change in appetite Eyes: No visual changes, double vision, eye pain Ear, nose and throat: No hearing loss, ear pain, nasal congestion, sore throat Cardiovascular: No chest pain, palpitations Respiratory:  No shortness of breath at rest or with exertion, wheezes GastrointestinaI: No nausea, vomiting, diarrhea, abdominal pain, fecal incontinence Genitourinary:  No dysuria, urinary retention or frequency Musculoskeletal:  No neck pain, back pain Integumentary: No rash, pruritus, skin  lesions Neurological: as above Psychiatric: No depression, insomnia, anxiety Endocrine: No palpitations, fatigue, diaphoresis, mood swings, change in appetite, change in weight, increased thirst Hematologic/Lymphatic:  No purpura, petechiae. Allergic/Immunologic: no itchy/runny eyes, nasal congestion, recent allergic reactions, rashes  PHYSICAL EXAM: Vitals:   06/14/17 1536  BP: 122/82  Pulse: 78  SpO2: 94%   General: No acute distress.  Patient appears well-groomed.   Head:  Normocephalic/atraumatic Eyes:  Fundi examined but not visualized Neck: supple, no paraspinal tenderness, full range of motion Heart:  Regular rate and rhythm Lungs:  Clear to auscultation bilaterally Back: No paraspinal tenderness Neurological Exam: alert and oriented to person, time, state and city. Attention span and concentration impaired, delayed recall poor, remote memory intact, fund of knowledge intact.  Speech fluent and not dysarthric, language intact but did not write a complete sentence.  Did not correctly copy intersecting pentagons. MMSE - Mini Mental State Exam 06/14/2017 02/24/2016 11/22/2014 05/21/2014  Not completed: - Unable to complete - -  Orientation to time 5 - 5 4  Orientation to Place 2 - 5 4  Registration 3 - 3 3  Attention/ Calculation 1 - 5 3  Recall 0 - 2 0  Language- name 2 objects 2 - 2 2  Language- repeat 1 - 1 1  Language- follow 3 step command 3 - 3 3  Language- read & follow direction 1 - 1 1  Write a sentence 0 - 1 1  Copy design 0 - 1 0  Total score 18 - 29 22   CN II-XII intact. Bulk and tone normal, muscle strength 5/5 throughout.  Sensation to light touch  intact.  Deep tendon reflexes 2+ throughout.  Finger to nose testing intact.  Gait normal  IMPRESSION: Mixed Alzheimer's and vascular dementia  PLAN: 1.  Continue donepezil and memantine 2.  Continue management of secondary stroke risk factors:  Aspirin, statin, blood pressure control 4.  Follow up in one year or  as needed.  25 minutes spent face to face with patient, over 50% spent discussing management.  Metta Clines, DO  CC:  Dr. Virgina Jock

## 2017-06-19 DIAGNOSIS — S52572D Other intraarticular fracture of lower end of left radius, subsequent encounter for closed fracture with routine healing: Secondary | ICD-10-CM | POA: Diagnosis not present

## 2017-06-25 ENCOUNTER — Telehealth: Payer: Self-pay | Admitting: Neurology

## 2017-06-25 DIAGNOSIS — G309 Alzheimer's disease, unspecified: Principal | ICD-10-CM

## 2017-06-25 DIAGNOSIS — F0280 Dementia in other diseases classified elsewhere without behavioral disturbance: Principal | ICD-10-CM

## 2017-06-25 DIAGNOSIS — F0150 Vascular dementia without behavioral disturbance: Secondary | ICD-10-CM

## 2017-06-25 MED ORDER — MEMANTINE HCL ER 28 MG PO CP24: 28.0000 mg | ORAL_CAPSULE | Freq: Every day | ORAL | 3 refills | Status: DC

## 2017-06-25 MED ORDER — DONEPEZIL HCL 10 MG PO TABS: 10.0000 mg | ORAL_TABLET | Freq: Every day | ORAL | 3 refills | Status: DC

## 2017-06-25 NOTE — Telephone Encounter (Signed)
Patient brother would like [place] to call in a  RX to [facility] Mail order   Donepezil a 90 day supply   Memantine a 90 day supply

## 2017-06-27 ENCOUNTER — Ambulatory Visit: Payer: Medicare HMO | Admitting: Neurology

## 2017-09-13 DIAGNOSIS — E038 Other specified hypothyroidism: Secondary | ICD-10-CM | POA: Diagnosis not present

## 2017-09-13 DIAGNOSIS — Z1389 Encounter for screening for other disorder: Secondary | ICD-10-CM | POA: Diagnosis not present

## 2017-09-13 DIAGNOSIS — R627 Adult failure to thrive: Secondary | ICD-10-CM | POA: Diagnosis not present

## 2017-09-13 DIAGNOSIS — I10 Essential (primary) hypertension: Secondary | ICD-10-CM | POA: Diagnosis not present

## 2017-09-13 DIAGNOSIS — F0390 Unspecified dementia without behavioral disturbance: Secondary | ICD-10-CM | POA: Diagnosis not present

## 2017-09-13 DIAGNOSIS — J449 Chronic obstructive pulmonary disease, unspecified: Secondary | ICD-10-CM | POA: Diagnosis not present

## 2017-09-13 DIAGNOSIS — Z23 Encounter for immunization: Secondary | ICD-10-CM | POA: Diagnosis not present

## 2017-09-13 DIAGNOSIS — I77810 Thoracic aortic ectasia: Secondary | ICD-10-CM | POA: Diagnosis not present

## 2017-09-13 DIAGNOSIS — Z6832 Body mass index (BMI) 32.0-32.9, adult: Secondary | ICD-10-CM | POA: Diagnosis not present

## 2017-09-20 ENCOUNTER — Encounter: Payer: Self-pay | Admitting: Internal Medicine

## 2017-09-20 ENCOUNTER — Ambulatory Visit (INDEPENDENT_AMBULATORY_CARE_PROVIDER_SITE_OTHER): Payer: Medicare HMO | Admitting: Internal Medicine

## 2017-09-20 VITALS — BP 134/80 | HR 59 | Ht 60.5 in | Wt 157.2 lb

## 2017-09-20 DIAGNOSIS — J449 Chronic obstructive pulmonary disease, unspecified: Secondary | ICD-10-CM

## 2017-09-20 DIAGNOSIS — G4733 Obstructive sleep apnea (adult) (pediatric): Secondary | ICD-10-CM | POA: Diagnosis not present

## 2017-09-20 MED ORDER — ALBUTEROL SULFATE HFA 108 (90 BASE) MCG/ACT IN AERS
2.0000 | INHALATION_SPRAY | Freq: Four times a day (QID) | RESPIRATORY_TRACT | 3 refills | Status: DC | PRN
Start: 2017-09-20 — End: 2022-07-04

## 2017-09-20 NOTE — Patient Instructions (Signed)
Refill script for albuterol rescue inhaler was sent  We suggested you resume use of your CPAP to see if the headaches go away.  Be sure also to discuss the headaches with your medical doctor at your next appointment.  Please call if we can help

## 2017-09-20 NOTE — Progress Notes (Signed)
Patient ID: Nancy Cole, female    DOB: 02/03/1934, 83 y.o.   MRN: [id number]  HPI female never smoker followed for asthma/fixed obstructive airways disease, OSA, rhinitis NPSG 01/19/04-  Moderately severe obstructive sleep apnea/hypopnea syndrome, RDI 39.8 per hour with oxygen desaturation to 76%, body weight 151 pounds  CPAP titration to 9 CWP Walk Test room air 02/16/15- no desaturation below 91%. PFT 06/16/2015-moderate obstructive airways disease-FVC 2.00/90%, FEV1 1.01/61%, FEV1/FVC 0.68, TLC 78%, DLCO 75%. Insignificant response to bronchodilator --------------------------------------------------------------------------------------------  09/20/16- 83 year old female never smoker followed for asthma/fixed obstructive airways disease, OSA, rhinitis CPAP 9/[facility]>  Replace machine, auto 5-15 today FOLLOWS FOR: DME: [facility]. Pt wears CPAP nighlty and no new supplies needed at this time. Pt states she is not resting very well-pt is not sure why.  Anoro, albuterol HFA, Singulair Download compliance 96%, averaging over 7 hours per night with AHI 2.9/hour. Humidity bothers her breathing. Dyspnea on exertion is stable. She is not active and stays indoors most of the time. No acute respiratory events. Only occasional use of rescue inhaler. No recent infections. Bothersome watery postnasal drip with no real pattern.  09/20/2017- 83 year old female never smoker followed for asthma/fixed obstructive airways disease, OSA, rhinitis CPAP auto 5-15/[facility]              Download 13% compliance AHI 4.2/hour -----DME [facility] Pt is not wearing CPAP nightly at [facility] in [place], [place]. DL attached.  Singulair, Anoro, Zyrtec, albuterol HFA, Has been adult care facility for 6 months.  She has stopped bothering with CPAP and says she sleeps well, comfortable without it.  Complains of vertex headache that she wakes up with, taking extra strength Tylenol.  Not sure how long the headaches have  been going on.  She will discuss with her PCP at upcoming appointment. Only occasional need for albuterol rescue inhaler, feels well controlled.  Not wheezing at night.  Review of Systems-See HPI  + = positive Constitutional:   No-   weight loss, night sweats, fevers, chills, fatigue, lassitude. HEENT:   No-  headaches, difficulty swallowing, tooth/dental problems, sore throat,       No-  sneezing, itching, ear ache, nasal congestion, + post nasal drip,  CV:  No-   chest pain, orthopnea, PND, swelling in lower extremities, anasarca, dizziness, palpitations Resp: + shortness of breath with exertion or at rest.              No-   productive cough,  No non-productive cough,  No-  coughing up of blood.              No-   change in color of mucus.  No- wheezing.   Skin: No-   rash or lesions. GI:  No-   heartburn, indigestion, abdominal pain, nausea, vomiting,  GU: . MS:  No-   joint pain or swelling.   Neuro- nothing unusual/note hospital workup  Psych:  No- change in mood or affect. No depression or anxiety.  No memory loss.  Objective:   Physical Exam General- Alert, Oriented, Affect-appropriate, Distress- none acute. Elderly.  Skin- rash-none, lesions- none, excoriation- none Lymphadenopathy- none Head- atraumatic            Eyes- Gross vision intact, PERRLA, conjunctivae clear secretions            Ears- Hearing, canals-normal            Nose- Clear, no-Septal dev, mucus, polyps, erosion, perforation  Throat- Mallampati III , mucosa + thrush , drainage- none, tonsils- atrophic, + partial plate  Neck- flexible , trachea midline, no stridor , thyroid nl, carotid no bruit Chest - symmetrical excursion , unlabored           Heart/CV- RRR , no murmur , no gallop  , no rub, nl s1 s2                           - JVD- none , edema- none, stasis changes- none, varices- none           Lung- clear to P&A, wheeze- none, cough- none , dullness-none, rub- none           Chest wall-   Abd- Br/ Gen/ Rectal- Not done, not indicated Extrem- cyanosis- none, clubbing, none, atrophy- none, strength- nl, + rolling walker Neuro- grossly intact to observation Assessment & Plan:

## 2017-09-20 NOTE — Assessment & Plan Note (Signed)
Asthma component is mild, intermittent, uncomplicated.  She is stable without exacerbation.  Has already had flu shot. Plan-refill rescue inhaler, continue Anoro

## 2017-09-20 NOTE — Assessment & Plan Note (Signed)
She still has CPAP machine.  We questioned whether her vertex headaches might have started around the time she dropped off with CPAP. Plan-recommend retrying CPAP auto 5-20 to see if headaches are impacted.

## 2017-11-21 ENCOUNTER — Emergency Department (HOSPITAL_COMMUNITY): Payer: Medicare HMO

## 2017-11-21 ENCOUNTER — Encounter (HOSPITAL_COMMUNITY): Payer: Self-pay | Admitting: Emergency Medicine

## 2017-11-21 ENCOUNTER — Other Ambulatory Visit: Payer: Self-pay

## 2017-11-21 ENCOUNTER — Emergency Department (HOSPITAL_COMMUNITY)
Admission: EM | Admit: 2017-11-21 | Discharge: 2017-11-21 | Disposition: A | Discharge status: Home / Self Care | Payer: Medicare HMO | Attending: Emergency Medicine | Admitting: Emergency Medicine

## 2017-11-21 DIAGNOSIS — J069 Acute upper respiratory infection, unspecified: Secondary | ICD-10-CM | POA: Insufficient documentation

## 2017-11-21 DIAGNOSIS — Z7982 Long term (current) use of aspirin: Secondary | ICD-10-CM | POA: Insufficient documentation

## 2017-11-21 DIAGNOSIS — Z79899 Other long term (current) drug therapy: Secondary | ICD-10-CM | POA: Insufficient documentation

## 2017-11-21 DIAGNOSIS — J4521 Mild intermittent asthma with (acute) exacerbation: Secondary | ICD-10-CM | POA: Diagnosis not present

## 2017-11-21 DIAGNOSIS — R0609 Other forms of dyspnea: Secondary | ICD-10-CM | POA: Diagnosis not present

## 2017-11-21 DIAGNOSIS — B9789 Other viral agents as the cause of diseases classified elsewhere: Secondary | ICD-10-CM

## 2017-11-21 DIAGNOSIS — R05 Cough: Secondary | ICD-10-CM | POA: Diagnosis not present

## 2017-11-21 DIAGNOSIS — Z96652 Presence of left artificial knee joint: Secondary | ICD-10-CM | POA: Insufficient documentation

## 2017-11-21 DIAGNOSIS — J449 Chronic obstructive pulmonary disease, unspecified: Secondary | ICD-10-CM | POA: Insufficient documentation

## 2017-11-21 LAB — RESPIRATORY PANEL BY PCR
ADENOVIRUS-RVPPCR: NOT DETECTED
Bordetella pertussis: NOT DETECTED
CORONAVIRUS 229E-RVPPCR: NOT DETECTED
CORONAVIRUS NL63-RVPPCR: NOT DETECTED
Chlamydophila pneumoniae: NOT DETECTED
Coronavirus HKU1: NOT DETECTED
Coronavirus OC43: NOT DETECTED
Influenza A: NOT DETECTED
Influenza B: NOT DETECTED
MYCOPLASMA PNEUMONIAE-RVPPCR: NOT DETECTED
Metapneumovirus: NOT DETECTED
PARAINFLUENZA VIRUS 1-RVPPCR: DETECTED — AB
Parainfluenza Virus 2: NOT DETECTED
Parainfluenza Virus 3: NOT DETECTED
Parainfluenza Virus 4: NOT DETECTED
Respiratory Syncytial Virus: NOT DETECTED
Rhinovirus / Enterovirus: NOT DETECTED

## 2017-11-21 LAB — CBC WITH DIFFERENTIAL/PLATELET
Abs Immature Granulocytes: 0.01 10*3/uL (ref 0.00–0.07)
BASOS ABS: 0 10*3/uL (ref 0.0–0.1)
Basophils Relative: 1 %
Eosinophils Absolute: 0.1 10*3/uL (ref 0.0–0.5)
Eosinophils Relative: 1 %
HCT: 42.6 % (ref 36.0–46.0)
Hemoglobin: 13.2 g/dL (ref 12.0–15.0)
IMMATURE GRANULOCYTES: 0 %
Lymphocytes Relative: 25 %
Lymphs Abs: 1.5 10*3/uL (ref 0.7–4.0)
MCH: 29.3 pg (ref 26.0–34.0)
MCHC: 31 g/dL (ref 30.0–36.0)
MCV: 94.5 fL (ref 80.0–100.0)
Monocytes Absolute: 0.5 10*3/uL (ref 0.1–1.0)
Monocytes Relative: 9 %
NEUTROS ABS: 3.7 10*3/uL (ref 1.7–7.7)
Neutrophils Relative %: 64 %
Platelets: 221 10*3/uL (ref 150–400)
RBC: 4.51 MIL/uL (ref 3.87–5.11)
RDW: 13.3 % (ref 11.5–15.5)
WBC: 5.8 10*3/uL (ref 4.0–10.5)
nRBC: 0 % (ref 0.0–0.2)

## 2017-11-21 LAB — I-STAT TROPONIN, ED: Troponin i, poc: 0.01 ng/mL (ref 0.00–0.08)

## 2017-11-21 LAB — BASIC METABOLIC PANEL
Anion gap: 7 (ref 5–15)
BUN: 11 mg/dL (ref 8–23)
CALCIUM: 9 mg/dL (ref 8.9–10.3)
CO2: 25 mmol/L (ref 22–32)
CREATININE: 0.67 mg/dL (ref 0.44–1.00)
Chloride: 106 mmol/L (ref 98–111)
GFR calc non Af Amer: >60 mL/min (ref >60)
Glucose, Bld: 81 mg/dL (ref 70–99)
Potassium: 3.8 mmol/L (ref 3.5–5.1)
SODIUM: 138 mmol/L (ref 135–145)

## 2017-11-21 MED ORDER — IPRATROPIUM-ALBUTEROL 0.5-2.5 (3) MG/3ML IN SOLN
3.0000 mL | Freq: Once | RESPIRATORY_TRACT | Status: AC
  Administered 2017-11-21: 3 mL via RESPIRATORY_TRACT
  Filled 2017-11-21: qty 3

## 2017-11-21 MED ORDER — PREDNISONE 50 MG PO TABS
60.0000 mg | ORAL_TABLET | Freq: Once | ORAL | Status: AC
  Administered 2017-11-21: 60 mg via ORAL
  Filled 2017-11-21: qty 1

## 2017-11-21 MED ORDER — OXYMETAZOLINE HCL 0.05 % NA SOLN
1.0000 | Freq: Once | NASAL | Status: AC
  Administered 2017-11-21: 1 via NASAL
  Filled 2017-11-21: qty 15

## 2017-11-21 MED ORDER — PREDNISONE 10 MG PO TABS: 20.0000 mg | ORAL_TABLET | Freq: Every day | ORAL | 0 refills | Status: DC

## 2017-11-21 MED ORDER — PREDNISONE 10 MG PO TABS: 40.0000 mg | ORAL_TABLET | Freq: Every day | ORAL | 0 refills | Status: AC

## 2017-11-21 NOTE — Discharge Instructions (Addendum)
Your symptoms are most likely from a virus that has caused an upper respiratory infection and led to an asthma flare.  Your chest x-ray is negative. Your labs are normal.  You felt better after medicines in ER and your oxygen level was at your baseline when you walked.  A viral illness typically peaks on day 2-3 and resolves after one week.    The main treatment approach for a viral upper respiratory infection is to treat the symptoms, support your immune system and prevent spread of illness.   Stay well-hydrated. Rest. You can use over the counter medications to help with symptoms: 600 mg ibuprofen (motrin, aleve, advil) or acetaminophen (tylenol) every 6 hours, around the clock to help with associated fevers, sore throat, headaches, generalized body aches and malaise.  Oxymetazoline (afrin) intranasal spray once daily for no more than 3 days to help with congestion, after 3 days you can switch to another over-the-counter nasal steroid spray such as fluticasone (flonase) Allergy medication (loratadine, cetirizine, etc) and phenylephrine (sudafed) help with nasal congestion, runny nose and postnasal drip.   Dextromethorphan (Delsym) to suppress cough without mucus Guaifenesin (mucinex) to help with built up mucus in chest and productive cough Wash your hands often to prevent spread.   Use prednisone and scheduled breathing treatment.    Return to ER for fevers, worsening symptoms, chest pain or shortness of breath with exertion.

## 2017-11-21 NOTE — ED Notes (Signed)
Pt c/o cough that is productive with yellow to clear sputum, clear nasal drainage as well, unsure of any fever, pt lives in assisted living facility,

## 2017-11-21 NOTE — ED Notes (Signed)
Walked pt in hallway, starting 02 92% on RA, during walking pt was 93%-94%, pt states " I feel like a different person, so much better already".

## 2017-11-21 NOTE — ED Notes (Signed)
ED Provider at bedside. 

## 2017-11-21 NOTE — ED Notes (Signed)
Patient given discharge instruction, verbalized understand. IV removed, band aid applied. Patient walker out of the department.

## 2017-11-21 NOTE — ED Triage Notes (Signed)
Pt c/o non-productive cough, nasal congestion, malaise, and red, burning eyes x 3 days. Denies fever.

## 2017-11-21 NOTE — ED Notes (Signed)
Pulse ox dropped to 91-92% with ambulation, pt c/o "a little sob" upon getting back into the bed, no distress noted,

## 2017-11-21 NOTE — ED Provider Notes (Addendum)
[facility] EMERGENCY DEPARTMENT Provider Note   CSN: [id number] Arrival date & time: 11/21/17  1122     History   Chief Complaint Chief Complaint  Patient presents with  . Cough    HPI Nancy Cole is a 83 y.o. female with h/o asthma, COPD (never a smoker), HTN, hypothyroidism, OSA on CPAP, TIAs, Alzheimer's and vascular dementia is here for evaluation of SOB.  Onset 2 days ago, only with exertion and walking with her walker.  Usually does not have exertional Sob with short distances.  No SOB at rest. She denies associated CP, light-headedness, palpitations, syncope.  Does report significant nasal congestion, rhinorrhea, cough with yellow/clear sputum production onset 3-4 days ago. She has h/o asthma/COPD but never been a smoker. She has been compliant with both inhalers and cetirizine at home. Inhalers provide good temporary relief.  No other interventions for symptoms.  Unsure of sick contacts but lives in an independent living facility.  She denies fevers, chills, sweats, sore throat, ear pain, CP, nausea, vomiting, abdominal pain, diarrhea.   HPI  Past Medical History:  Diagnosis Date  . Asthma   . COPD (chronic obstructive pulmonary disease) ([place])    PFT 2007 FEV1/FVC 43%  PFT 01/12/10 FEV1 1.0/51%, FVC 2.0/79%, FEV1/FVC 0.48  . DDD (degenerative disc disease)   . Diverticulitis   . OSA on CPAP   . Osteoarthritis   . Seasonal allergies     Patient Active Problem List   Diagnosis Date Noted  . Dementia ([place]) 05/21/2014  . 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 Cerebrovascular disease 05/21/2014  . Numbness 10/23/2011  . Candidiasis 04/29/2011  . Dehydration 04/25/2011  . Hypotension 04/25/2011  . Pyelonephritis 04/25/2011  . Urosepsis 04/25/2011  . Azotemia 04/25/2011  . Leukocytosis 04/25/2011  . Rhinitis 04/13/2010  . THRUSH 09/28/2009  . DIARRHEA 03/31/2009  . DYSPNEA 12/30/2008  . ADENOCARCINOMA, COLON 02/13/2008  . Obstructive sleep apnea 01/28/2007  . Asthma with  COPD ([place]) 01/28/2007  . OSTEOARTHRITIS 01/28/2007    Past Surgical History:  Procedure Laterality Date  . APPENDECTOMY    . CATARACT EXTRACTION, BILATERAL    . CHOLECYSTECTOMY    . PARTIAL COLECTOMY  2009   for diverticulitis  . PLEURAL SCARIFICATION  1979   recurrent spontaneuous--L--- finally had tac pleurodesis  . TOTAL KNEE ARTHROPLASTY     L     OB History   None      Home Medications    Prior to Admission medications   Medication Sig Start Date End Date Taking? Authorizing Provider  acetaminophen (TYLENOL) 500 MG tablet Take 1,000 mg by mouth every 6 (six) hours as needed for mild pain or moderate pain.     [provider]  albuterol (PROVENTIL HFA;VENTOLIN HFA) 108 (90 Base) MCG/ACT inhaler Inhale 2 puffs into the lungs every 6 (six) hours as needed for wheezing or shortness of breath. 09/20/17   Deneise Lever, MD  ANORO ELLIPTA 62.5-25 MCG/INH AEPB INHALE 1 PUFF EVERY DAY 02/07/16   Deneise Lever, MD  aspirin 81 MG tablet Take 81 mg by mouth daily.    [provider]  atorvastatin (LIPITOR) 20 MG tablet Take 20 mg by mouth every morning.  01/20/15   [provider]  cetirizine (ZYRTEC) 10 MG tablet Take 10 mg by mouth every morning.     [provider]  Cholecalciferol (VITAMIN D) 2000 UNITS CAPS Take 1 capsule by mouth at bedtime.     [provider]  donepezil (ARICEPT)  10 MG tablet TAKE 1 TABLET AT BEDTIME 03/27/17   Jaffe, Adam R, DO  donepezil (ARICEPT) 10 MG tablet Take 1 tablet (10 mg total) by mouth at bedtime. 06/25/17   Pieter Partridge, DO  HYDROcodone-acetaminophen (NORCO/VICODIN) 5-325 MG tablet Take 1 tablet by mouth every 6 (six) hours as needed. 04/12/17   Lily Kocher, PA-C  levothyroxine (SYNTHROID, LEVOTHROID) 100 MCG tablet Take 100 mcg by mouth daily.      [provider]  meloxicam (MOBIC) 7.5 MG tablet Take 1 tablet (7.5 mg total) by mouth daily. 04/12/17   Lily Kocher, PA-C  memantine (NAMENDA  XR) 28 MG CP24 24 hr capsule Take 1 capsule (28 mg total) by mouth daily. 03/20/17   Pieter Partridge, DO  memantine (NAMENDA XR) 28 MG CP24 24 hr capsule Take 1 capsule (28 mg total) by mouth daily. 06/25/17   Jaffe, Adam R, DO  montelukast (SINGULAIR) 10 MG tablet TAKE 1 TABLET AT BEDTIME    Young, Clinton D, MD  potassium chloride (K-DUR,KLOR-CON) 10 MEQ tablet Take 20 mEq by mouth 2 (two) times daily.  09/14/10   [provider]  predniSONE (DELTASONE) 10 MG tablet Take 4 tablets (40 mg total) by mouth daily for 7 days. 11/21/17 11/28/17  Kinnie Feil, PA-C  sertraline (ZOLOFT) 100 MG tablet Take 100 mg by mouth daily.    [provider]    Family History Family History  Problem Relation Age of Onset  . COPD Father        heavy smoker  . Heart attack Mother   . COPD Brother        smoker    Social History Social History   Tobacco Use  . Smoking status: Never Smoker  . Smokeless tobacco: Never Used  Substance Use Topics  . Alcohol use: No    Alcohol/week: 0.0 standard drinks  . Drug use: No     Allergies   Sulfamethoxazole and Penicillins   Review of Systems Review of Systems  HENT: Positive for congestion, postnasal drip and rhinorrhea.   Respiratory: Positive for cough and shortness of breath.   All other systems reviewed and are negative.    Physical Exam Updated Vital Signs BP 133/73 (BP Location: Right Arm)   Pulse 81   Temp 98.7 F (37.1 C) (Oral)   Resp 16   Ht 5\' 4"  (1.626 m)   Wt 77.1 kg   SpO2 95%   BMI 29.18 kg/m   Physical Exam  Constitutional: She is oriented to person, place, and time. She appears well-developed and well-nourished.  Sound congested, rhinorrhea noted. Non toxic.   HENT:  Head: Normocephalic and atraumatic.  Nose: Nose normal.  Mild mucosal edema and erythema, clear rhinorrhea noted bilaterally.  Oropharynx and tonsils slightly erythematous w/o edema, exudates, petechiae. Uvula midline.   Eyes: Pupils are  equal, round, and reactive to light. Conjunctivae and EOM are normal.  Neck: Normal range of motion.  Mild bilateral enlargement of submandibular lymph nodes.  Cardiovascular: Normal rate and regular rhythm.  1+ radial and DP pulses bilaterally. No LE edema or calf tenderness.   Pulmonary/Chest: Effort normal. She has wheezes.  Faint expiratory wheezing to lower lobes anteriorly. Normal work of breathing. Speaking in full sentences.  SpO2 dropped to 91-92% with ambulation with complaints of mild SOB, improved with rest.   Abdominal: Soft. Bowel sounds are normal. There is no tenderness.  No G/R/R. No suprapubic or CVA tenderness. Negative Murphy's and McBurney's. Active  BS to lower quadrants.   Musculoskeletal: Normal range of motion.  Neurological: She is alert and oriented to person, place, and time.  Skin: Skin is warm and dry. Capillary refill takes less than 2 seconds.  Psychiatric: She has a normal mood and affect. Her behavior is normal.  Nursing note and vitals reviewed.    ED Treatments / Results  Labs (all labs ordered are listed, but only abnormal results are displayed) Labs Reviewed  RESPIRATORY PANEL BY PCR  CBC WITH DIFFERENTIAL/PLATELET  BASIC METABOLIC PANEL  I-STAT TROPONIN, ED    EKG EKG Interpretation  Date/Time:  Thursday November 21 2017 12:48:59 EST Ventricular Rate:  65 PR Interval:  176 QRS Duration: 80 QT Interval:  438 QTC Calculation: 455 R Axis:   4 Text Interpretation:  Normal sinus rhythm Low voltage QRS Septal infarct , age undetermined When compared with ECG of 10/23/2011 No significant change was found Confirmed by Francine Graven [phone]) on 11/21/2017 1:28:33 PM   Radiology Dg Chest 2 View  Result Date: 11/21/2017 CLINICAL DATA:  Cough EXAM: CHEST - 2 VIEW COMPARISON:  02/16/2015 FINDINGS: The lungs are hyperinflated likely secondary to COPD. There is bibasilar scarring. There is no focal parenchymal opacity. There is no pleural effusion  or pneumothorax. The heart and mediastinal contours are unremarkable. There is no acute osseous injury. There is osteoarthritis of the left glenohumeral joint. IMPRESSION: No active cardiopulmonary disease. Electronically Signed   By: Kathreen Devoid   On: 11/21/2017 12:18    Procedures Procedures (including critical care time)  Medications Ordered in ED Medications  ipratropium-albuterol (DUONEB) 0.5-2.5 (3) MG/3ML nebulizer solution 3 mL (3 mLs Nebulization Given 11/21/17 1331)  predniSONE (DELTASONE) tablet 60 mg (60 mg Oral Given 11/21/17 1329)  oxymetazoline (AFRIN) 0.05 % nasal spray 1 spray (1 spray Each Nare Given 11/21/17 1328)     Initial Impression / Assessment and Plan / ED Course  I have reviewed the triage vital signs and the nursing notes.  Pertinent labs & imaging results that were available during my care of the patient were reviewed by me and considered in my medical decision making (see chart for details).    Pt presents with with URI sxs, exertional dyspnea, wheezing most likely from viral URI or ILI leading to uncomplicated asthma exacerbation.  H/o asthma/COPD but never been a smoker.  Followed closely by pulmonology (Dr. Annamaria Boots).  On exam, pt is non toxic appearing with normal breathing effort. No fever, no tachypnea, no tachycardia, normal oxygen saturations. Only faint expiratory wheezing. Wheezing improved after ED tx with breathing tx and steroids. Pt ambulated in ED with SpO2 at her baseline, and improved symptoms. Spo2 dropped to 91-92% but this is her baseline, was ambulated at pulmonology office September 2019 with exertional Spo2 of 91%.  Pt looked and felt better after afrin, duoneb and prednisone. She ambulated to bathroom a second time without issues.  CXR normal.  Normal WBC. Troponin normal. Pending respiratory panel.  No indication for tamiflu given time frame symptom onset > 72 hours ago.  Given reasuring exam, labs, ambulatory Spo2 at baseline pt considered  appropriate for discharge with tx for viral URI and asthma flare.  Doubt PNA, bacterial bronchitis given work up and exam.  She has no h/o HF and last echo with normal EF.  She does not look fluid overloaded.  ED return precautions given. Patient is aware that a viral URI infection may precede the onset of bacterial bronchitis or pneumonia. Patient and husband will  monitor symptoms closely for next 48-72 hours, aware of s/s that would warrant return to ED for further reevaluation. Pt ambulated again prior to discharge with SpO2 93-94%.   Final Clinical Impressions(s) / ED Diagnoses   Final diagnoses:  Viral URI with cough  Mild intermittent asthma with exacerbation  Exertional dyspnea    ED Discharge Orders         Ordered    predniSONE (DELTASONE) 10 MG tablet  Daily,   Status:  Discontinued     11/21/17 1451    predniSONE (DELTASONE) 10 MG tablet  Daily     11/21/17 1544            Kinnie Feil, PA-C 11/21/17 [place], [place], DO 11/22/17 [phone]

## 2018-06-05 DIAGNOSIS — E038 Other specified hypothyroidism: Secondary | ICD-10-CM | POA: Diagnosis not present

## 2018-06-05 DIAGNOSIS — E7849 Other hyperlipidemia: Secondary | ICD-10-CM | POA: Diagnosis not present

## 2018-06-05 DIAGNOSIS — M859 Disorder of bone density and structure, unspecified: Secondary | ICD-10-CM | POA: Diagnosis not present

## 2018-06-05 DIAGNOSIS — I10 Essential (primary) hypertension: Secondary | ICD-10-CM | POA: Diagnosis not present

## 2018-06-12 DIAGNOSIS — Z1331 Encounter for screening for depression: Secondary | ICD-10-CM | POA: Diagnosis not present

## 2018-06-12 DIAGNOSIS — I10 Essential (primary) hypertension: Secondary | ICD-10-CM | POA: Diagnosis not present

## 2018-06-12 DIAGNOSIS — F3341 Major depressive disorder, recurrent, in partial remission: Secondary | ICD-10-CM | POA: Diagnosis not present

## 2018-06-12 DIAGNOSIS — Z0000 Encounter for general adult medical examination without abnormal findings: Secondary | ICD-10-CM | POA: Diagnosis not present

## 2018-06-12 DIAGNOSIS — E785 Hyperlipidemia, unspecified: Secondary | ICD-10-CM | POA: Diagnosis not present

## 2018-06-12 DIAGNOSIS — I77810 Thoracic aortic ectasia: Secondary | ICD-10-CM | POA: Diagnosis not present

## 2018-06-12 DIAGNOSIS — F0390 Unspecified dementia without behavioral disturbance: Secondary | ICD-10-CM | POA: Diagnosis not present

## 2018-06-12 DIAGNOSIS — M25531 Pain in right wrist: Secondary | ICD-10-CM | POA: Diagnosis not present

## 2018-06-12 DIAGNOSIS — R627 Adult failure to thrive: Secondary | ICD-10-CM | POA: Diagnosis not present

## 2018-06-12 DIAGNOSIS — R809 Proteinuria, unspecified: Secondary | ICD-10-CM | POA: Diagnosis not present

## 2018-06-13 ENCOUNTER — Other Ambulatory Visit: Payer: Self-pay

## 2018-06-13 NOTE — Progress Notes (Addendum)
Virtual Visit via Video Note The purpose of this virtual visit is to provide medical care while limiting exposure to the novel coronavirus.    Consent was obtained for video visit:  Yes  Answered questions that patient had about telehealth interaction:  Yes   I discussed the limitations, risks, security and privacy concerns of performing an evaluation and management service by telemedicine. I also discussed with the patient that there may be a patient responsible charge related to this service. The patient expressed understanding and agreed to proceed.  Pt location: Home Physician Location: Home Name of referring provider:  Shon Baton, MD I connected with Chana Bode Overbey at patients initiation/request on 06/16/2018 at  2:30 PM EDT by video enabled telemedicine application and verified that I am speaking with the correct person using two identifiers. Pt MRN:  [id number] Pt DOB:  07-21-1934 Video Participants:  Erroll Luna   History of Present Illness:  Nancy Cole is an 84 year old right-handed woman with asthma, COPD, hypertension, hypothyroidism, OSA, left macular degeneration and history of TIAs who follows up for mixed Alzheimers and vascular dementia.  UPDATE: She is taking Aricept 10mg  daily and Namenda XR 28mg  daily.  She takes sertraline.  She lives in [facility] assisted living in [place].  She lives in her own apartment.  Her best friend recently moved there.  She feels that her memory is worse.  Her brother handles her finances and manages her medications.  She is able to dress, bath and use toilet independently. Aides from the assisted living come by to check up on her.  Usually she sleeps well but it has been more difficult for the past few nights due to humidity.  Overall, she is feeling well.  She denies depression.  Appetite is okay.  She tries to read during the day and will take naps.  HISTORY: She reports problems with memory, gradually worse over the past  several years. She reports difficulty remembering names of people she knows. She also has gotten lost while driving on familiar routes. She no longer drives. She also started making mistakes when paying the bills. She feels disorganized and has trouble focusing. She denies family history of dementia.  She lives by herself but her brother and sister-in-law live across the street and she sees them daily.  Her brother is her POA.  MRI of brain without contrast from 06/09/14 showed diffuse atrophy and small vessel ischemic changes, including remote lacunar infarcts in both basal ganglia and cerebellum, as well as possible occlusion of left vertebral artery.  B12 was 881.  Carotid doppler from 11/26/14 showed no hemodynamically significant ICA stenosis but demonstrated nonvisualization of left vertebral artery flow.  She was evaluated by Dr. Erling Cruz in 2009 for blackouts. At that time, she was in her car in the driveway and the next thing she knew, she was in the car after knocking down the fence in the backyard. There was no preceding aura, tunnel vision or lightheadedness. She was not confused when she woke up. An MRI of the brain performed 08/07/07 reportedly showed chronic small vessel disease in the periventricular white matter and cerebellum. MRA of the head and neck performed on 09/09/07 showed a diminutive left vertebral artery with dominant right vertebral artery and without intracranial stenosis or carotid stenosis.   She has history of several TIAs, including one in October 2013. She presented with left sided paresthesias and numbness. MRI of the brain revealed no acute infarct, but again  showed extensive white matter disease with chronic lacunes and probable occluded left vertebral artery. Carotid doppler revealed 40-59% right ICA stenosis based on velocities, thought to be due to tortuosity. Transcranial doppler was normal. 2D echo showed EF 60-65% with grade 1 diastolic  dysfunction.  She underwent a driving evaluation with OT at [facility] on 10/12/15.  She exhibited deficits in cognitive function required for safe independent driving, such as functional memory, problem-solving, attention, concentration and decision-making skills.  Therefore, it was recommended that she cease all driving.    Past Medical History: Past Medical History:  Diagnosis Date   Asthma    COPD (chronic obstructive pulmonary disease) ([place])    PFT 2007 FEV1/FVC 43%  PFT 01/12/10 FEV1 1.0/51%, FVC 2.0/79%, FEV1/FVC 0.48   DDD (degenerative disc disease)    Diverticulitis    OSA on CPAP    Osteoarthritis    Seasonal allergies     Medications: Outpatient Encounter Medications as of 06/16/2018  Medication Sig   acetaminophen (TYLENOL) 500 MG tablet Take 1,000 mg by mouth every 6 (six) hours as needed for mild pain or moderate pain.    albuterol (PROVENTIL HFA;VENTOLIN HFA) 108 (90 Base) MCG/ACT inhaler Inhale 2 puffs into the lungs every 6 (six) hours as needed for wheezing or shortness of breath.   ANORO ELLIPTA 62.5-25 MCG/INH AEPB INHALE 1 PUFF EVERY DAY   aspirin 81 MG tablet Take 81 mg by mouth daily.   atorvastatin (LIPITOR) 20 MG tablet Take 20 mg by mouth every morning.    cetirizine (ZYRTEC) 10 MG tablet Take 10 mg by mouth every morning.    Cholecalciferol (VITAMIN D) 2000 UNITS CAPS Take 1 capsule by mouth at bedtime.    donepezil (ARICEPT) 10 MG tablet TAKE 1 TABLET AT BEDTIME   donepezil (ARICEPT) 10 MG tablet Take 1 tablet (10 mg total) by mouth at bedtime.   HYDROcodone-acetaminophen (NORCO/VICODIN) 5-325 MG tablet Take 1 tablet by mouth every 6 (six) hours as needed.   levothyroxine (SYNTHROID, LEVOTHROID) 100 MCG tablet Take 100 mcg by mouth daily.     meloxicam (MOBIC) 7.5 MG tablet Take 1 tablet (7.5 mg total) by mouth daily.   memantine (NAMENDA XR) 28 MG CP24 24 hr capsule Take 1 capsule (28 mg total) by mouth daily.    memantine (NAMENDA XR) 28 MG CP24 24 hr capsule Take 1 capsule (28 mg total) by mouth daily.   montelukast (SINGULAIR) 10 MG tablet TAKE 1 TABLET AT BEDTIME   potassium chloride (K-DUR,KLOR-CON) 10 MEQ tablet Take 20 mEq by mouth 2 (two) times daily.    sertraline (ZOLOFT) 100 MG tablet Take 100 mg by mouth daily.   No facility-administered encounter medications on file as of 06/16/2018.     Allergies: Allergies  Allergen Reactions   Sulfamethoxazole     REACTION: unknown   Penicillins Rash    Has patient had a PCN reaction causing immediate rash, facial/tongue/throat swelling, SOB or lightheadedness with hypotension: No Has patient had a PCN reaction causing severe rash involving mucus membranes or skin necrosis: No Has patient had a PCN reaction that required hospitalization: No Has patient had a PCN reaction occurring within the last 10 years: No If all of the above answers are "NO", then may proceed with Cephalosporin use.     Family History: Family History  Problem Relation Age of Onset   COPD Father        heavy smoker   Heart attack Mother    COPD  Brother        smoker    Social History: Social History   Socioeconomic History   Marital status: Divorced    Spouse name: Not on file   Number of children: Not on file   Years of education: Not on file   Highest education level: Not on file  Occupational History   Occupation: Actuary: [employer]    Comment: Retired  Scientist, product/process development strain: Not on Training and development officer insecurity:    Worry: Not on file    Inability: Not on Lexicographer needs:    Medical: Not on file    Non-medical: Not on file  Tobacco Use   Smoking status: Never Smoker   Smokeless tobacco: Never Used  Substance and Sexual Activity   Alcohol use: No    Alcohol/week: 0.0 standard drinks   Drug use: No   Sexual activity: Never  Lifestyle   Physical activity:    Days per  week: Not on file    Minutes per session: Not on file   Stress: Not on file  Relationships   Social connections:    Talks on phone: Not on file    Gets together: Not on file    Attends religious service: Not on file    Active member of club or organization: Not on file    Attends meetings of clubs or organizations: Not on file    Relationship status: Not on file   Intimate partner violence:    Fear of current or ex partner: Not on file    Emotionally abused: Not on file    Physically abused: Not on file    Forced sexual activity: Not on file  Other Topics Concern   Not on file  Social History Narrative   Positive history of passive tobacco smoke exposure-significant exposure to parents, heavy smokers.     Observations/Objective:   There were no vitals taken for this visit. No acute distress.  Alert and oriented.  Speech fluent and not dysarthric.  Language intact.  Face symmetric.     Assessment and Plan:   Mixed Alzheimer's and vascular dementia.  1.  Aricept and Namenda 2.  Follow up in one year.  Follow Up Instructions:    -I discussed the assessment and treatment plan with the patient. The patient was provided an opportunity to ask questions and all were answered. The patient agreed with the plan and demonstrated an understanding of the instructions.   The patient was advised to call back or seek an in-person evaluation if the symptoms worsen or if the condition fails to improve as anticipated.    Total Time spent in visit with the patient was:  15 minutes.   Dudley Major, DO

## 2018-06-16 ENCOUNTER — Other Ambulatory Visit: Payer: Self-pay

## 2018-06-16 ENCOUNTER — Telehealth (INDEPENDENT_AMBULATORY_CARE_PROVIDER_SITE_OTHER): Payer: Medicare HMO | Admitting: Neurology

## 2018-06-16 ENCOUNTER — Encounter: Payer: Self-pay | Admitting: Neurology

## 2018-06-16 DIAGNOSIS — G309 Alzheimer's disease, unspecified: Secondary | ICD-10-CM

## 2018-06-16 DIAGNOSIS — F0150 Vascular dementia without behavioral disturbance: Secondary | ICD-10-CM

## 2018-06-16 DIAGNOSIS — F0280 Dementia in other diseases classified elsewhere without behavioral disturbance: Secondary | ICD-10-CM

## 2018-06-17 ENCOUNTER — Telehealth: Payer: Self-pay | Admitting: Neurology

## 2018-06-17 NOTE — Telephone Encounter (Signed)
Everything seems stable.  Recommended no change in medications.

## 2018-06-17 NOTE — Telephone Encounter (Signed)
Brother calling to get some info about the VV yesterday. He said she has dementia and he was wanting to know how the visit went. Please call him back at [phone].  Thanks!

## 2018-06-18 NOTE — Telephone Encounter (Signed)
Called and spoke with [name]. We discussed that the Pt is happy that her best friend is now also a resident in the same facility, that she is able to perform ADL's. He is concerned she is up roaming around at night and napping too much during the day. We discussed that this was not uncommon with Alzheimers and that she was in a safe environment. He was satisfied with our discussion and thanked me for the information.

## 2018-07-18 DIAGNOSIS — F0390 Unspecified dementia without behavioral disturbance: Secondary | ICD-10-CM | POA: Diagnosis not present

## 2018-07-18 DIAGNOSIS — M47896 Other spondylosis, lumbar region: Secondary | ICD-10-CM | POA: Diagnosis not present

## 2018-07-18 DIAGNOSIS — M19012 Primary osteoarthritis, left shoulder: Secondary | ICD-10-CM | POA: Diagnosis not present

## 2018-07-18 DIAGNOSIS — I6523 Occlusion and stenosis of bilateral carotid arteries: Secondary | ICD-10-CM | POA: Diagnosis not present

## 2018-07-18 DIAGNOSIS — G8929 Other chronic pain: Secondary | ICD-10-CM | POA: Diagnosis not present

## 2018-07-18 DIAGNOSIS — J449 Chronic obstructive pulmonary disease, unspecified: Secondary | ICD-10-CM | POA: Diagnosis not present

## 2018-07-18 DIAGNOSIS — M19011 Primary osteoarthritis, right shoulder: Secondary | ICD-10-CM | POA: Diagnosis not present

## 2018-07-18 DIAGNOSIS — G4733 Obstructive sleep apnea (adult) (pediatric): Secondary | ICD-10-CM | POA: Diagnosis not present

## 2018-07-18 DIAGNOSIS — I10 Essential (primary) hypertension: Secondary | ICD-10-CM | POA: Diagnosis not present

## 2018-07-21 DIAGNOSIS — F0390 Unspecified dementia without behavioral disturbance: Secondary | ICD-10-CM | POA: Diagnosis not present

## 2018-07-21 DIAGNOSIS — M47896 Other spondylosis, lumbar region: Secondary | ICD-10-CM | POA: Diagnosis not present

## 2018-07-21 DIAGNOSIS — G8929 Other chronic pain: Secondary | ICD-10-CM | POA: Diagnosis not present

## 2018-07-21 DIAGNOSIS — I6523 Occlusion and stenosis of bilateral carotid arteries: Secondary | ICD-10-CM | POA: Diagnosis not present

## 2018-07-21 DIAGNOSIS — I10 Essential (primary) hypertension: Secondary | ICD-10-CM | POA: Diagnosis not present

## 2018-07-21 DIAGNOSIS — G4733 Obstructive sleep apnea (adult) (pediatric): Secondary | ICD-10-CM | POA: Diagnosis not present

## 2018-07-21 DIAGNOSIS — M19011 Primary osteoarthritis, right shoulder: Secondary | ICD-10-CM | POA: Diagnosis not present

## 2018-07-21 DIAGNOSIS — J449 Chronic obstructive pulmonary disease, unspecified: Secondary | ICD-10-CM | POA: Diagnosis not present

## 2018-07-21 DIAGNOSIS — M19012 Primary osteoarthritis, left shoulder: Secondary | ICD-10-CM | POA: Diagnosis not present

## 2018-07-23 DIAGNOSIS — G8929 Other chronic pain: Secondary | ICD-10-CM | POA: Diagnosis not present

## 2018-07-23 DIAGNOSIS — M19011 Primary osteoarthritis, right shoulder: Secondary | ICD-10-CM | POA: Diagnosis not present

## 2018-07-23 DIAGNOSIS — I6523 Occlusion and stenosis of bilateral carotid arteries: Secondary | ICD-10-CM | POA: Diagnosis not present

## 2018-07-23 DIAGNOSIS — M19012 Primary osteoarthritis, left shoulder: Secondary | ICD-10-CM | POA: Diagnosis not present

## 2018-07-23 DIAGNOSIS — M47896 Other spondylosis, lumbar region: Secondary | ICD-10-CM | POA: Diagnosis not present

## 2018-07-23 DIAGNOSIS — G4733 Obstructive sleep apnea (adult) (pediatric): Secondary | ICD-10-CM | POA: Diagnosis not present

## 2018-07-23 DIAGNOSIS — I10 Essential (primary) hypertension: Secondary | ICD-10-CM | POA: Diagnosis not present

## 2018-07-23 DIAGNOSIS — J449 Chronic obstructive pulmonary disease, unspecified: Secondary | ICD-10-CM | POA: Diagnosis not present

## 2018-07-23 DIAGNOSIS — F0390 Unspecified dementia without behavioral disturbance: Secondary | ICD-10-CM | POA: Diagnosis not present

## 2018-07-28 DIAGNOSIS — G8929 Other chronic pain: Secondary | ICD-10-CM | POA: Diagnosis not present

## 2018-07-28 DIAGNOSIS — J449 Chronic obstructive pulmonary disease, unspecified: Secondary | ICD-10-CM | POA: Diagnosis not present

## 2018-07-28 DIAGNOSIS — F0390 Unspecified dementia without behavioral disturbance: Secondary | ICD-10-CM | POA: Diagnosis not present

## 2018-07-28 DIAGNOSIS — M19011 Primary osteoarthritis, right shoulder: Secondary | ICD-10-CM | POA: Diagnosis not present

## 2018-07-28 DIAGNOSIS — I6523 Occlusion and stenosis of bilateral carotid arteries: Secondary | ICD-10-CM | POA: Diagnosis not present

## 2018-07-28 DIAGNOSIS — I10 Essential (primary) hypertension: Secondary | ICD-10-CM | POA: Diagnosis not present

## 2018-07-28 DIAGNOSIS — M47896 Other spondylosis, lumbar region: Secondary | ICD-10-CM | POA: Diagnosis not present

## 2018-07-28 DIAGNOSIS — G4733 Obstructive sleep apnea (adult) (pediatric): Secondary | ICD-10-CM | POA: Diagnosis not present

## 2018-07-28 DIAGNOSIS — M19012 Primary osteoarthritis, left shoulder: Secondary | ICD-10-CM | POA: Diagnosis not present

## 2018-07-29 DIAGNOSIS — J449 Chronic obstructive pulmonary disease, unspecified: Secondary | ICD-10-CM | POA: Diagnosis not present

## 2018-07-29 DIAGNOSIS — I6523 Occlusion and stenosis of bilateral carotid arteries: Secondary | ICD-10-CM | POA: Diagnosis not present

## 2018-07-29 DIAGNOSIS — M47896 Other spondylosis, lumbar region: Secondary | ICD-10-CM | POA: Diagnosis not present

## 2018-07-29 DIAGNOSIS — I10 Essential (primary) hypertension: Secondary | ICD-10-CM | POA: Diagnosis not present

## 2018-07-29 DIAGNOSIS — G8929 Other chronic pain: Secondary | ICD-10-CM | POA: Diagnosis not present

## 2018-07-29 DIAGNOSIS — F0390 Unspecified dementia without behavioral disturbance: Secondary | ICD-10-CM | POA: Diagnosis not present

## 2018-07-29 DIAGNOSIS — M19012 Primary osteoarthritis, left shoulder: Secondary | ICD-10-CM | POA: Diagnosis not present

## 2018-07-29 DIAGNOSIS — G4733 Obstructive sleep apnea (adult) (pediatric): Secondary | ICD-10-CM | POA: Diagnosis not present

## 2018-07-29 DIAGNOSIS — M19011 Primary osteoarthritis, right shoulder: Secondary | ICD-10-CM | POA: Diagnosis not present

## 2018-07-30 DIAGNOSIS — I10 Essential (primary) hypertension: Secondary | ICD-10-CM | POA: Diagnosis not present

## 2018-07-30 DIAGNOSIS — M19011 Primary osteoarthritis, right shoulder: Secondary | ICD-10-CM | POA: Diagnosis not present

## 2018-07-30 DIAGNOSIS — G8929 Other chronic pain: Secondary | ICD-10-CM | POA: Diagnosis not present

## 2018-07-30 DIAGNOSIS — I6523 Occlusion and stenosis of bilateral carotid arteries: Secondary | ICD-10-CM | POA: Diagnosis not present

## 2018-07-30 DIAGNOSIS — M47896 Other spondylosis, lumbar region: Secondary | ICD-10-CM | POA: Diagnosis not present

## 2018-07-30 DIAGNOSIS — G4733 Obstructive sleep apnea (adult) (pediatric): Secondary | ICD-10-CM | POA: Diagnosis not present

## 2018-07-30 DIAGNOSIS — M19012 Primary osteoarthritis, left shoulder: Secondary | ICD-10-CM | POA: Diagnosis not present

## 2018-07-30 DIAGNOSIS — J449 Chronic obstructive pulmonary disease, unspecified: Secondary | ICD-10-CM | POA: Diagnosis not present

## 2018-07-30 DIAGNOSIS — F0390 Unspecified dementia without behavioral disturbance: Secondary | ICD-10-CM | POA: Diagnosis not present

## 2018-07-31 DIAGNOSIS — M19012 Primary osteoarthritis, left shoulder: Secondary | ICD-10-CM | POA: Diagnosis not present

## 2018-07-31 DIAGNOSIS — G8929 Other chronic pain: Secondary | ICD-10-CM | POA: Diagnosis not present

## 2018-07-31 DIAGNOSIS — F0390 Unspecified dementia without behavioral disturbance: Secondary | ICD-10-CM | POA: Diagnosis not present

## 2018-07-31 DIAGNOSIS — I10 Essential (primary) hypertension: Secondary | ICD-10-CM | POA: Diagnosis not present

## 2018-07-31 DIAGNOSIS — G4733 Obstructive sleep apnea (adult) (pediatric): Secondary | ICD-10-CM | POA: Diagnosis not present

## 2018-07-31 DIAGNOSIS — I6523 Occlusion and stenosis of bilateral carotid arteries: Secondary | ICD-10-CM | POA: Diagnosis not present

## 2018-07-31 DIAGNOSIS — M19011 Primary osteoarthritis, right shoulder: Secondary | ICD-10-CM | POA: Diagnosis not present

## 2018-07-31 DIAGNOSIS — M47896 Other spondylosis, lumbar region: Secondary | ICD-10-CM | POA: Diagnosis not present

## 2018-07-31 DIAGNOSIS — J449 Chronic obstructive pulmonary disease, unspecified: Secondary | ICD-10-CM | POA: Diagnosis not present

## 2018-08-04 DIAGNOSIS — I6523 Occlusion and stenosis of bilateral carotid arteries: Secondary | ICD-10-CM | POA: Diagnosis not present

## 2018-08-04 DIAGNOSIS — J449 Chronic obstructive pulmonary disease, unspecified: Secondary | ICD-10-CM | POA: Diagnosis not present

## 2018-08-04 DIAGNOSIS — M19012 Primary osteoarthritis, left shoulder: Secondary | ICD-10-CM | POA: Diagnosis not present

## 2018-08-04 DIAGNOSIS — F0390 Unspecified dementia without behavioral disturbance: Secondary | ICD-10-CM | POA: Diagnosis not present

## 2018-08-04 DIAGNOSIS — G4733 Obstructive sleep apnea (adult) (pediatric): Secondary | ICD-10-CM | POA: Diagnosis not present

## 2018-08-04 DIAGNOSIS — M19011 Primary osteoarthritis, right shoulder: Secondary | ICD-10-CM | POA: Diagnosis not present

## 2018-08-04 DIAGNOSIS — G8929 Other chronic pain: Secondary | ICD-10-CM | POA: Diagnosis not present

## 2018-08-04 DIAGNOSIS — I10 Essential (primary) hypertension: Secondary | ICD-10-CM | POA: Diagnosis not present

## 2018-08-04 DIAGNOSIS — M47896 Other spondylosis, lumbar region: Secondary | ICD-10-CM | POA: Diagnosis not present

## 2018-08-05 DIAGNOSIS — G8929 Other chronic pain: Secondary | ICD-10-CM | POA: Diagnosis not present

## 2018-08-05 DIAGNOSIS — I6523 Occlusion and stenosis of bilateral carotid arteries: Secondary | ICD-10-CM | POA: Diagnosis not present

## 2018-08-05 DIAGNOSIS — J449 Chronic obstructive pulmonary disease, unspecified: Secondary | ICD-10-CM | POA: Diagnosis not present

## 2018-08-05 DIAGNOSIS — G4733 Obstructive sleep apnea (adult) (pediatric): Secondary | ICD-10-CM | POA: Diagnosis not present

## 2018-08-05 DIAGNOSIS — M19012 Primary osteoarthritis, left shoulder: Secondary | ICD-10-CM | POA: Diagnosis not present

## 2018-08-05 DIAGNOSIS — M19011 Primary osteoarthritis, right shoulder: Secondary | ICD-10-CM | POA: Diagnosis not present

## 2018-08-05 DIAGNOSIS — M47896 Other spondylosis, lumbar region: Secondary | ICD-10-CM | POA: Diagnosis not present

## 2018-08-05 DIAGNOSIS — I10 Essential (primary) hypertension: Secondary | ICD-10-CM | POA: Diagnosis not present

## 2018-08-05 DIAGNOSIS — F0390 Unspecified dementia without behavioral disturbance: Secondary | ICD-10-CM | POA: Diagnosis not present

## 2018-08-06 DIAGNOSIS — G8929 Other chronic pain: Secondary | ICD-10-CM | POA: Diagnosis not present

## 2018-08-06 DIAGNOSIS — I6523 Occlusion and stenosis of bilateral carotid arteries: Secondary | ICD-10-CM | POA: Diagnosis not present

## 2018-08-06 DIAGNOSIS — G4733 Obstructive sleep apnea (adult) (pediatric): Secondary | ICD-10-CM | POA: Diagnosis not present

## 2018-08-06 DIAGNOSIS — M47896 Other spondylosis, lumbar region: Secondary | ICD-10-CM | POA: Diagnosis not present

## 2018-08-06 DIAGNOSIS — M19012 Primary osteoarthritis, left shoulder: Secondary | ICD-10-CM | POA: Diagnosis not present

## 2018-08-06 DIAGNOSIS — M19011 Primary osteoarthritis, right shoulder: Secondary | ICD-10-CM | POA: Diagnosis not present

## 2018-08-06 DIAGNOSIS — I10 Essential (primary) hypertension: Secondary | ICD-10-CM | POA: Diagnosis not present

## 2018-08-06 DIAGNOSIS — F0390 Unspecified dementia without behavioral disturbance: Secondary | ICD-10-CM | POA: Diagnosis not present

## 2018-08-06 DIAGNOSIS — J449 Chronic obstructive pulmonary disease, unspecified: Secondary | ICD-10-CM | POA: Diagnosis not present

## 2018-08-07 DIAGNOSIS — M19012 Primary osteoarthritis, left shoulder: Secondary | ICD-10-CM | POA: Diagnosis not present

## 2018-08-07 DIAGNOSIS — M19011 Primary osteoarthritis, right shoulder: Secondary | ICD-10-CM | POA: Diagnosis not present

## 2018-08-07 DIAGNOSIS — I6523 Occlusion and stenosis of bilateral carotid arteries: Secondary | ICD-10-CM | POA: Diagnosis not present

## 2018-08-07 DIAGNOSIS — G8929 Other chronic pain: Secondary | ICD-10-CM | POA: Diagnosis not present

## 2018-08-07 DIAGNOSIS — I10 Essential (primary) hypertension: Secondary | ICD-10-CM | POA: Diagnosis not present

## 2018-08-07 DIAGNOSIS — M47896 Other spondylosis, lumbar region: Secondary | ICD-10-CM | POA: Diagnosis not present

## 2018-08-07 DIAGNOSIS — F0390 Unspecified dementia without behavioral disturbance: Secondary | ICD-10-CM | POA: Diagnosis not present

## 2018-08-07 DIAGNOSIS — G4733 Obstructive sleep apnea (adult) (pediatric): Secondary | ICD-10-CM | POA: Diagnosis not present

## 2018-08-07 DIAGNOSIS — J449 Chronic obstructive pulmonary disease, unspecified: Secondary | ICD-10-CM | POA: Diagnosis not present

## 2018-08-11 DIAGNOSIS — F0390 Unspecified dementia without behavioral disturbance: Secondary | ICD-10-CM | POA: Diagnosis not present

## 2018-08-11 DIAGNOSIS — M47896 Other spondylosis, lumbar region: Secondary | ICD-10-CM | POA: Diagnosis not present

## 2018-08-11 DIAGNOSIS — I6523 Occlusion and stenosis of bilateral carotid arteries: Secondary | ICD-10-CM | POA: Diagnosis not present

## 2018-08-11 DIAGNOSIS — G4733 Obstructive sleep apnea (adult) (pediatric): Secondary | ICD-10-CM | POA: Diagnosis not present

## 2018-08-11 DIAGNOSIS — I10 Essential (primary) hypertension: Secondary | ICD-10-CM | POA: Diagnosis not present

## 2018-08-11 DIAGNOSIS — J449 Chronic obstructive pulmonary disease, unspecified: Secondary | ICD-10-CM | POA: Diagnosis not present

## 2018-08-11 DIAGNOSIS — M19011 Primary osteoarthritis, right shoulder: Secondary | ICD-10-CM | POA: Diagnosis not present

## 2018-08-11 DIAGNOSIS — G8929 Other chronic pain: Secondary | ICD-10-CM | POA: Diagnosis not present

## 2018-08-11 DIAGNOSIS — M19012 Primary osteoarthritis, left shoulder: Secondary | ICD-10-CM | POA: Diagnosis not present

## 2018-08-12 DIAGNOSIS — M19012 Primary osteoarthritis, left shoulder: Secondary | ICD-10-CM | POA: Diagnosis not present

## 2018-08-12 DIAGNOSIS — G8929 Other chronic pain: Secondary | ICD-10-CM | POA: Diagnosis not present

## 2018-08-12 DIAGNOSIS — M47896 Other spondylosis, lumbar region: Secondary | ICD-10-CM | POA: Diagnosis not present

## 2018-08-12 DIAGNOSIS — J449 Chronic obstructive pulmonary disease, unspecified: Secondary | ICD-10-CM | POA: Diagnosis not present

## 2018-08-12 DIAGNOSIS — G4733 Obstructive sleep apnea (adult) (pediatric): Secondary | ICD-10-CM | POA: Diagnosis not present

## 2018-08-12 DIAGNOSIS — M19011 Primary osteoarthritis, right shoulder: Secondary | ICD-10-CM | POA: Diagnosis not present

## 2018-08-12 DIAGNOSIS — F0390 Unspecified dementia without behavioral disturbance: Secondary | ICD-10-CM | POA: Diagnosis not present

## 2018-08-12 DIAGNOSIS — I6523 Occlusion and stenosis of bilateral carotid arteries: Secondary | ICD-10-CM | POA: Diagnosis not present

## 2018-08-12 DIAGNOSIS — I10 Essential (primary) hypertension: Secondary | ICD-10-CM | POA: Diagnosis not present

## 2018-08-13 DIAGNOSIS — G4733 Obstructive sleep apnea (adult) (pediatric): Secondary | ICD-10-CM | POA: Diagnosis not present

## 2018-08-13 DIAGNOSIS — F0390 Unspecified dementia without behavioral disturbance: Secondary | ICD-10-CM | POA: Diagnosis not present

## 2018-08-13 DIAGNOSIS — G8929 Other chronic pain: Secondary | ICD-10-CM | POA: Diagnosis not present

## 2018-08-13 DIAGNOSIS — J449 Chronic obstructive pulmonary disease, unspecified: Secondary | ICD-10-CM | POA: Diagnosis not present

## 2018-08-13 DIAGNOSIS — M19012 Primary osteoarthritis, left shoulder: Secondary | ICD-10-CM | POA: Diagnosis not present

## 2018-08-13 DIAGNOSIS — M19011 Primary osteoarthritis, right shoulder: Secondary | ICD-10-CM | POA: Diagnosis not present

## 2018-08-13 DIAGNOSIS — M47896 Other spondylosis, lumbar region: Secondary | ICD-10-CM | POA: Diagnosis not present

## 2018-08-13 DIAGNOSIS — I10 Essential (primary) hypertension: Secondary | ICD-10-CM | POA: Diagnosis not present

## 2018-08-13 DIAGNOSIS — I6523 Occlusion and stenosis of bilateral carotid arteries: Secondary | ICD-10-CM | POA: Diagnosis not present

## 2018-08-14 ENCOUNTER — Other Ambulatory Visit: Payer: Self-pay | Admitting: Neurology

## 2018-08-14 DIAGNOSIS — M19012 Primary osteoarthritis, left shoulder: Secondary | ICD-10-CM | POA: Diagnosis not present

## 2018-08-14 DIAGNOSIS — G8929 Other chronic pain: Secondary | ICD-10-CM | POA: Diagnosis not present

## 2018-08-14 DIAGNOSIS — M19011 Primary osteoarthritis, right shoulder: Secondary | ICD-10-CM | POA: Diagnosis not present

## 2018-08-14 DIAGNOSIS — I10 Essential (primary) hypertension: Secondary | ICD-10-CM | POA: Diagnosis not present

## 2018-08-14 DIAGNOSIS — I6523 Occlusion and stenosis of bilateral carotid arteries: Secondary | ICD-10-CM | POA: Diagnosis not present

## 2018-08-14 DIAGNOSIS — J449 Chronic obstructive pulmonary disease, unspecified: Secondary | ICD-10-CM | POA: Diagnosis not present

## 2018-08-14 DIAGNOSIS — F0390 Unspecified dementia without behavioral disturbance: Secondary | ICD-10-CM | POA: Diagnosis not present

## 2018-08-14 DIAGNOSIS — M47896 Other spondylosis, lumbar region: Secondary | ICD-10-CM | POA: Diagnosis not present

## 2018-08-14 DIAGNOSIS — G4733 Obstructive sleep apnea (adult) (pediatric): Secondary | ICD-10-CM | POA: Diagnosis not present

## 2018-08-15 DIAGNOSIS — M25461 Effusion, right knee: Secondary | ICD-10-CM | POA: Diagnosis not present

## 2018-08-15 DIAGNOSIS — M1711 Unilateral primary osteoarthritis, right knee: Secondary | ICD-10-CM | POA: Diagnosis not present

## 2018-08-15 DIAGNOSIS — M25561 Pain in right knee: Secondary | ICD-10-CM | POA: Diagnosis not present

## 2018-08-15 DIAGNOSIS — M23006 Cystic meniscus, unspecified meniscus, right knee: Secondary | ICD-10-CM | POA: Diagnosis not present

## 2018-08-15 DIAGNOSIS — I999 Unspecified disorder of circulatory system: Secondary | ICD-10-CM | POA: Diagnosis not present

## 2018-08-15 DIAGNOSIS — M11261 Other chondrocalcinosis, right knee: Secondary | ICD-10-CM | POA: Diagnosis not present

## 2018-08-16 DIAGNOSIS — R109 Unspecified abdominal pain: Secondary | ICD-10-CM | POA: Diagnosis not present

## 2018-08-16 DIAGNOSIS — Y998 Other external cause status: Secondary | ICD-10-CM | POA: Diagnosis not present

## 2018-08-16 DIAGNOSIS — R296 Repeated falls: Secondary | ICD-10-CM | POA: Diagnosis not present

## 2018-08-16 DIAGNOSIS — N200 Calculus of kidney: Secondary | ICD-10-CM | POA: Diagnosis not present

## 2018-08-16 DIAGNOSIS — R918 Other nonspecific abnormal finding of lung field: Secondary | ICD-10-CM | POA: Diagnosis not present

## 2018-08-16 DIAGNOSIS — M6281 Muscle weakness (generalized): Secondary | ICD-10-CM | POA: Diagnosis not present

## 2018-08-16 DIAGNOSIS — M1711 Unilateral primary osteoarthritis, right knee: Secondary | ICD-10-CM | POA: Diagnosis not present

## 2018-08-16 DIAGNOSIS — R2689 Other abnormalities of gait and mobility: Secondary | ICD-10-CM | POA: Diagnosis not present

## 2018-08-16 DIAGNOSIS — R269 Unspecified abnormalities of gait and mobility: Secondary | ICD-10-CM | POA: Diagnosis not present

## 2018-08-16 DIAGNOSIS — M11261 Other chondrocalcinosis, right knee: Secondary | ICD-10-CM | POA: Diagnosis not present

## 2018-08-16 DIAGNOSIS — R9389 Abnormal findings on diagnostic imaging of other specified body structures: Secondary | ICD-10-CM | POA: Diagnosis not present

## 2018-08-16 DIAGNOSIS — R0602 Shortness of breath: Secondary | ICD-10-CM | POA: Diagnosis not present

## 2018-08-16 DIAGNOSIS — M47816 Spondylosis without myelopathy or radiculopathy, lumbar region: Secondary | ICD-10-CM | POA: Diagnosis not present

## 2018-08-16 DIAGNOSIS — J90 Pleural effusion, not elsewhere classified: Secondary | ICD-10-CM | POA: Diagnosis not present

## 2018-08-16 DIAGNOSIS — W06XXXA Fall from bed, initial encounter: Secondary | ICD-10-CM | POA: Diagnosis not present

## 2018-08-16 DIAGNOSIS — N3001 Acute cystitis with hematuria: Secondary | ICD-10-CM | POA: Diagnosis not present

## 2018-08-16 DIAGNOSIS — M25561 Pain in right knee: Secondary | ICD-10-CM | POA: Diagnosis not present

## 2018-08-16 DIAGNOSIS — M542 Cervicalgia: Secondary | ICD-10-CM | POA: Diagnosis not present

## 2018-08-16 DIAGNOSIS — M79606 Pain in leg, unspecified: Secondary | ICD-10-CM | POA: Diagnosis not present

## 2018-08-16 DIAGNOSIS — R911 Solitary pulmonary nodule: Secondary | ICD-10-CM | POA: Diagnosis not present

## 2018-08-16 DIAGNOSIS — M25461 Effusion, right knee: Secondary | ICD-10-CM | POA: Diagnosis not present

## 2018-08-16 DIAGNOSIS — M160 Bilateral primary osteoarthritis of hip: Secondary | ICD-10-CM | POA: Diagnosis not present

## 2018-08-18 DIAGNOSIS — I10 Essential (primary) hypertension: Secondary | ICD-10-CM | POA: Diagnosis not present

## 2018-08-18 DIAGNOSIS — M19011 Primary osteoarthritis, right shoulder: Secondary | ICD-10-CM | POA: Diagnosis not present

## 2018-08-18 DIAGNOSIS — J449 Chronic obstructive pulmonary disease, unspecified: Secondary | ICD-10-CM | POA: Diagnosis not present

## 2018-08-18 DIAGNOSIS — G4733 Obstructive sleep apnea (adult) (pediatric): Secondary | ICD-10-CM | POA: Diagnosis not present

## 2018-08-18 DIAGNOSIS — R9431 Abnormal electrocardiogram [ECG] [EKG]: Secondary | ICD-10-CM | POA: Diagnosis not present

## 2018-08-18 DIAGNOSIS — M47896 Other spondylosis, lumbar region: Secondary | ICD-10-CM | POA: Diagnosis not present

## 2018-08-18 DIAGNOSIS — M19012 Primary osteoarthritis, left shoulder: Secondary | ICD-10-CM | POA: Diagnosis not present

## 2018-08-18 DIAGNOSIS — G8929 Other chronic pain: Secondary | ICD-10-CM | POA: Diagnosis not present

## 2018-08-18 DIAGNOSIS — F0390 Unspecified dementia without behavioral disturbance: Secondary | ICD-10-CM | POA: Diagnosis not present

## 2018-08-18 DIAGNOSIS — I6523 Occlusion and stenosis of bilateral carotid arteries: Secondary | ICD-10-CM | POA: Diagnosis not present

## 2018-08-19 DIAGNOSIS — I10 Essential (primary) hypertension: Secondary | ICD-10-CM | POA: Diagnosis not present

## 2018-08-19 DIAGNOSIS — G8929 Other chronic pain: Secondary | ICD-10-CM | POA: Diagnosis not present

## 2018-08-19 DIAGNOSIS — G4733 Obstructive sleep apnea (adult) (pediatric): Secondary | ICD-10-CM | POA: Diagnosis not present

## 2018-08-19 DIAGNOSIS — M19011 Primary osteoarthritis, right shoulder: Secondary | ICD-10-CM | POA: Diagnosis not present

## 2018-08-19 DIAGNOSIS — J449 Chronic obstructive pulmonary disease, unspecified: Secondary | ICD-10-CM | POA: Diagnosis not present

## 2018-08-19 DIAGNOSIS — I6523 Occlusion and stenosis of bilateral carotid arteries: Secondary | ICD-10-CM | POA: Diagnosis not present

## 2018-08-19 DIAGNOSIS — M19012 Primary osteoarthritis, left shoulder: Secondary | ICD-10-CM | POA: Diagnosis not present

## 2018-08-19 DIAGNOSIS — M47896 Other spondylosis, lumbar region: Secondary | ICD-10-CM | POA: Diagnosis not present

## 2018-08-19 DIAGNOSIS — F0390 Unspecified dementia without behavioral disturbance: Secondary | ICD-10-CM | POA: Diagnosis not present

## 2018-08-20 DIAGNOSIS — I10 Essential (primary) hypertension: Secondary | ICD-10-CM | POA: Diagnosis not present

## 2018-08-20 DIAGNOSIS — J449 Chronic obstructive pulmonary disease, unspecified: Secondary | ICD-10-CM | POA: Diagnosis not present

## 2018-08-20 DIAGNOSIS — M19011 Primary osteoarthritis, right shoulder: Secondary | ICD-10-CM | POA: Diagnosis not present

## 2018-08-20 DIAGNOSIS — M19012 Primary osteoarthritis, left shoulder: Secondary | ICD-10-CM | POA: Diagnosis not present

## 2018-08-20 DIAGNOSIS — G8929 Other chronic pain: Secondary | ICD-10-CM | POA: Diagnosis not present

## 2018-08-20 DIAGNOSIS — I6523 Occlusion and stenosis of bilateral carotid arteries: Secondary | ICD-10-CM | POA: Diagnosis not present

## 2018-08-20 DIAGNOSIS — F0390 Unspecified dementia without behavioral disturbance: Secondary | ICD-10-CM | POA: Diagnosis not present

## 2018-08-20 DIAGNOSIS — M47896 Other spondylosis, lumbar region: Secondary | ICD-10-CM | POA: Diagnosis not present

## 2018-08-20 DIAGNOSIS — G4733 Obstructive sleep apnea (adult) (pediatric): Secondary | ICD-10-CM | POA: Diagnosis not present

## 2018-08-21 DIAGNOSIS — I6523 Occlusion and stenosis of bilateral carotid arteries: Secondary | ICD-10-CM | POA: Diagnosis not present

## 2018-08-21 DIAGNOSIS — G4733 Obstructive sleep apnea (adult) (pediatric): Secondary | ICD-10-CM | POA: Diagnosis not present

## 2018-08-21 DIAGNOSIS — M19011 Primary osteoarthritis, right shoulder: Secondary | ICD-10-CM | POA: Diagnosis not present

## 2018-08-21 DIAGNOSIS — F0390 Unspecified dementia without behavioral disturbance: Secondary | ICD-10-CM | POA: Diagnosis not present

## 2018-08-21 DIAGNOSIS — M19012 Primary osteoarthritis, left shoulder: Secondary | ICD-10-CM | POA: Diagnosis not present

## 2018-08-21 DIAGNOSIS — J449 Chronic obstructive pulmonary disease, unspecified: Secondary | ICD-10-CM | POA: Diagnosis not present

## 2018-08-21 DIAGNOSIS — G8929 Other chronic pain: Secondary | ICD-10-CM | POA: Diagnosis not present

## 2018-08-21 DIAGNOSIS — M47896 Other spondylosis, lumbar region: Secondary | ICD-10-CM | POA: Diagnosis not present

## 2018-08-21 DIAGNOSIS — I10 Essential (primary) hypertension: Secondary | ICD-10-CM | POA: Diagnosis not present

## 2018-08-25 DIAGNOSIS — M19012 Primary osteoarthritis, left shoulder: Secondary | ICD-10-CM | POA: Diagnosis not present

## 2018-08-25 DIAGNOSIS — G8929 Other chronic pain: Secondary | ICD-10-CM | POA: Diagnosis not present

## 2018-08-25 DIAGNOSIS — M47896 Other spondylosis, lumbar region: Secondary | ICD-10-CM | POA: Diagnosis not present

## 2018-08-25 DIAGNOSIS — F0390 Unspecified dementia without behavioral disturbance: Secondary | ICD-10-CM | POA: Diagnosis not present

## 2018-08-25 DIAGNOSIS — M19011 Primary osteoarthritis, right shoulder: Secondary | ICD-10-CM | POA: Diagnosis not present

## 2018-08-25 DIAGNOSIS — J449 Chronic obstructive pulmonary disease, unspecified: Secondary | ICD-10-CM | POA: Diagnosis not present

## 2018-08-25 DIAGNOSIS — G4733 Obstructive sleep apnea (adult) (pediatric): Secondary | ICD-10-CM | POA: Diagnosis not present

## 2018-08-25 DIAGNOSIS — I10 Essential (primary) hypertension: Secondary | ICD-10-CM | POA: Diagnosis not present

## 2018-08-25 DIAGNOSIS — I6523 Occlusion and stenosis of bilateral carotid arteries: Secondary | ICD-10-CM | POA: Diagnosis not present

## 2018-08-25 IMAGING — DX DG WRIST COMPLETE 3+V*L*
4 series · 4 of 4 positions shown · non-contrast
Comparison: None.

CLINICAL DATA: General left wrist pain and swelling for the past
week. No injury.

EXAM:
LEFT WRIST - COMPLETE 3+ VIEW

[wrist pa]
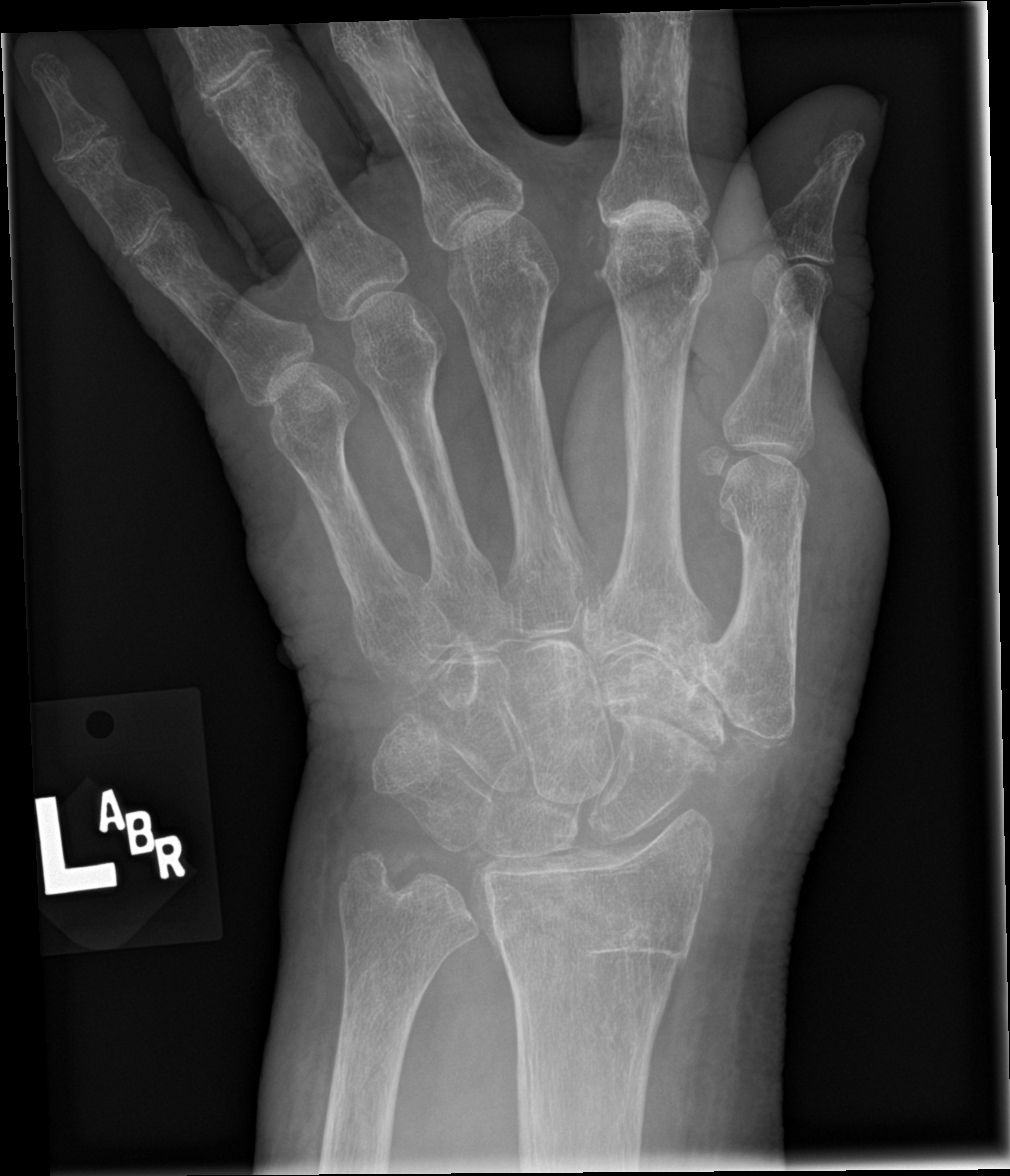

[wrist obl]
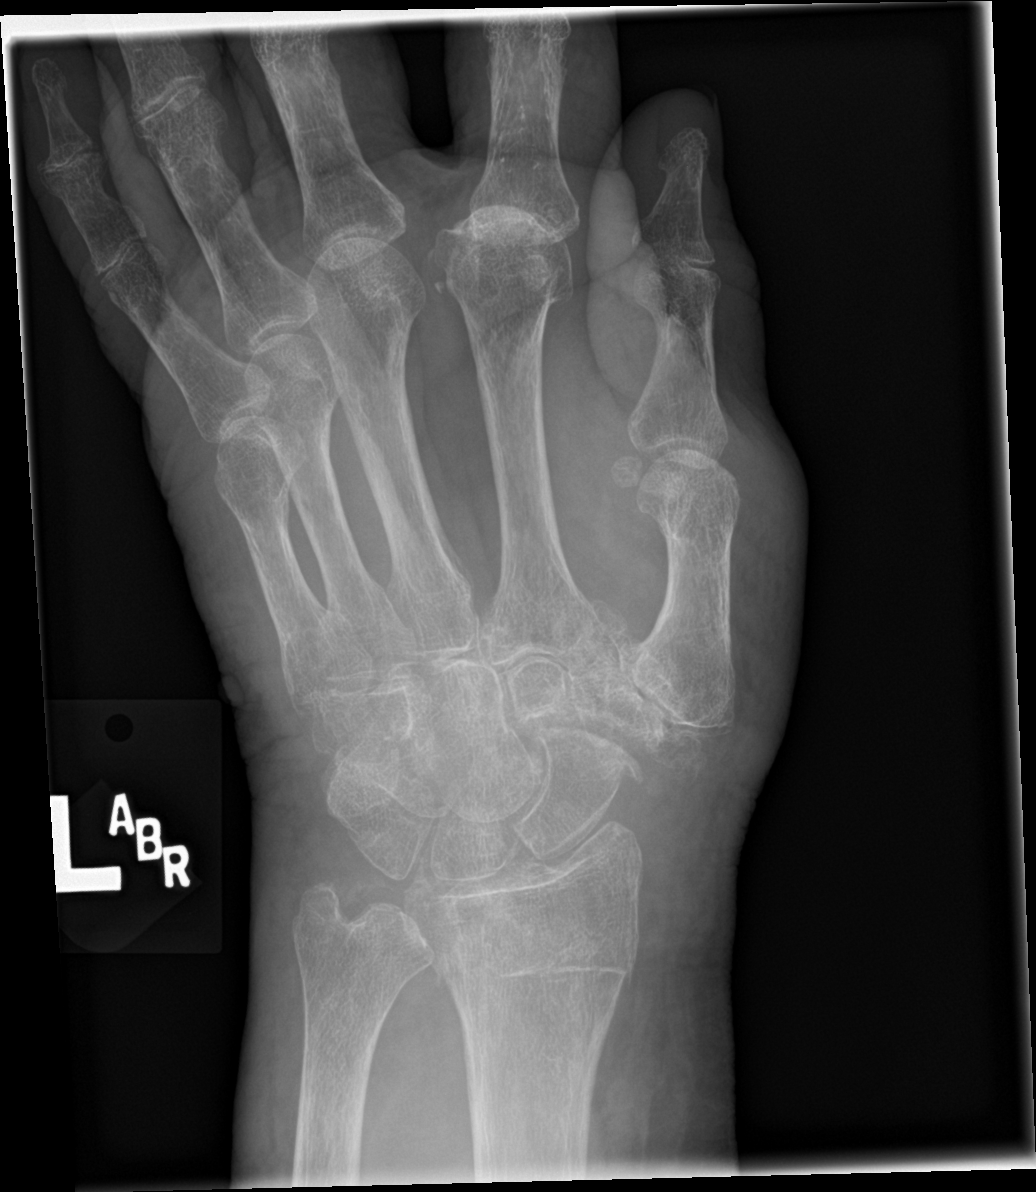

[wrist lat]
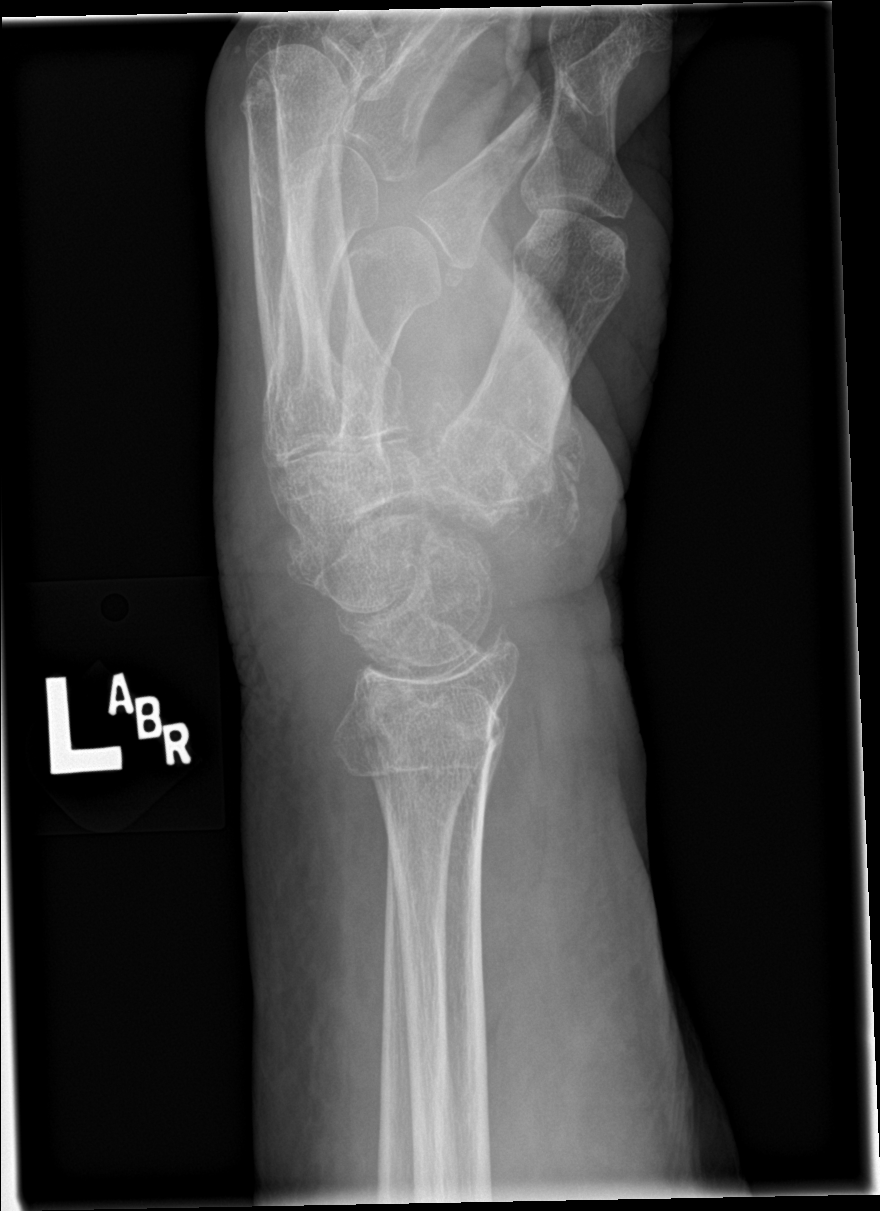

[wrist navicular]
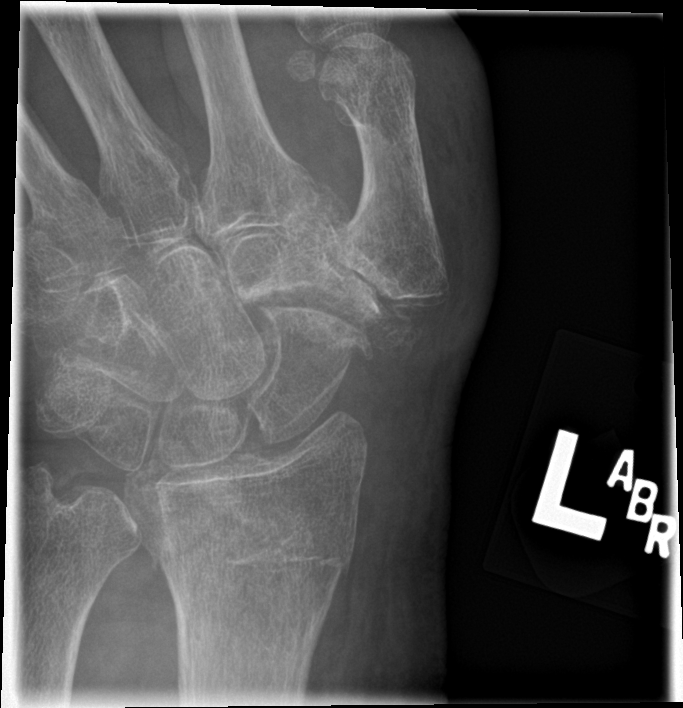

[4 of 4 positions shown; findings below may reference images not displayed]

FINDINGS: There is a nondisplaced fracture through the distal radius. Severe
osteoarthritis of the first CMC and second MCP joints. Diffuse
osteopenia. Chondrocalcinosis of the TFCC.
IMPRESSION: 1. Acute, nondisplaced transverse fracture through the distal
radius. No definite intra-articular extension.

## 2018-08-26 DIAGNOSIS — G4733 Obstructive sleep apnea (adult) (pediatric): Secondary | ICD-10-CM | POA: Diagnosis not present

## 2018-08-26 DIAGNOSIS — I6523 Occlusion and stenosis of bilateral carotid arteries: Secondary | ICD-10-CM | POA: Diagnosis not present

## 2018-08-26 DIAGNOSIS — G8929 Other chronic pain: Secondary | ICD-10-CM | POA: Diagnosis not present

## 2018-08-26 DIAGNOSIS — I10 Essential (primary) hypertension: Secondary | ICD-10-CM | POA: Diagnosis not present

## 2018-08-26 DIAGNOSIS — F0390 Unspecified dementia without behavioral disturbance: Secondary | ICD-10-CM | POA: Diagnosis not present

## 2018-08-26 DIAGNOSIS — M19012 Primary osteoarthritis, left shoulder: Secondary | ICD-10-CM | POA: Diagnosis not present

## 2018-08-26 DIAGNOSIS — M47896 Other spondylosis, lumbar region: Secondary | ICD-10-CM | POA: Diagnosis not present

## 2018-08-26 DIAGNOSIS — J449 Chronic obstructive pulmonary disease, unspecified: Secondary | ICD-10-CM | POA: Diagnosis not present

## 2018-08-26 DIAGNOSIS — M19011 Primary osteoarthritis, right shoulder: Secondary | ICD-10-CM | POA: Diagnosis not present

## 2018-08-27 DIAGNOSIS — I6523 Occlusion and stenosis of bilateral carotid arteries: Secondary | ICD-10-CM | POA: Diagnosis not present

## 2018-08-27 DIAGNOSIS — J449 Chronic obstructive pulmonary disease, unspecified: Secondary | ICD-10-CM | POA: Diagnosis not present

## 2018-08-27 DIAGNOSIS — G8929 Other chronic pain: Secondary | ICD-10-CM | POA: Diagnosis not present

## 2018-08-27 DIAGNOSIS — M19012 Primary osteoarthritis, left shoulder: Secondary | ICD-10-CM | POA: Diagnosis not present

## 2018-08-27 DIAGNOSIS — I10 Essential (primary) hypertension: Secondary | ICD-10-CM | POA: Diagnosis not present

## 2018-08-27 DIAGNOSIS — M19011 Primary osteoarthritis, right shoulder: Secondary | ICD-10-CM | POA: Diagnosis not present

## 2018-08-27 DIAGNOSIS — F0390 Unspecified dementia without behavioral disturbance: Secondary | ICD-10-CM | POA: Diagnosis not present

## 2018-08-27 DIAGNOSIS — M47896 Other spondylosis, lumbar region: Secondary | ICD-10-CM | POA: Diagnosis not present

## 2018-08-27 DIAGNOSIS — G4733 Obstructive sleep apnea (adult) (pediatric): Secondary | ICD-10-CM | POA: Diagnosis not present

## 2018-08-29 ENCOUNTER — Other Ambulatory Visit: Payer: Self-pay

## 2018-08-29 MED ORDER — DONEPEZIL HCL 10 MG PO TABS: 10.0000 mg | ORAL_TABLET | Freq: Every day | ORAL | 3 refills | Status: DC

## 2018-08-29 MED ORDER — MEMANTINE HCL ER 28 MG PO CP24: 28.0000 mg | ORAL_CAPSULE | Freq: Every day | ORAL | 3 refills | Status: DC

## 2018-09-01 DIAGNOSIS — M19012 Primary osteoarthritis, left shoulder: Secondary | ICD-10-CM | POA: Diagnosis not present

## 2018-09-01 DIAGNOSIS — J449 Chronic obstructive pulmonary disease, unspecified: Secondary | ICD-10-CM | POA: Diagnosis not present

## 2018-09-01 DIAGNOSIS — I6523 Occlusion and stenosis of bilateral carotid arteries: Secondary | ICD-10-CM | POA: Diagnosis not present

## 2018-09-01 DIAGNOSIS — G8929 Other chronic pain: Secondary | ICD-10-CM | POA: Diagnosis not present

## 2018-09-01 DIAGNOSIS — G4733 Obstructive sleep apnea (adult) (pediatric): Secondary | ICD-10-CM | POA: Diagnosis not present

## 2018-09-01 DIAGNOSIS — I10 Essential (primary) hypertension: Secondary | ICD-10-CM | POA: Diagnosis not present

## 2018-09-01 DIAGNOSIS — F0390 Unspecified dementia without behavioral disturbance: Secondary | ICD-10-CM | POA: Diagnosis not present

## 2018-09-01 DIAGNOSIS — M19011 Primary osteoarthritis, right shoulder: Secondary | ICD-10-CM | POA: Diagnosis not present

## 2018-09-01 DIAGNOSIS — M47896 Other spondylosis, lumbar region: Secondary | ICD-10-CM | POA: Diagnosis not present

## 2018-09-02 DIAGNOSIS — I6523 Occlusion and stenosis of bilateral carotid arteries: Secondary | ICD-10-CM | POA: Diagnosis not present

## 2018-09-02 DIAGNOSIS — G8929 Other chronic pain: Secondary | ICD-10-CM | POA: Diagnosis not present

## 2018-09-02 DIAGNOSIS — M19012 Primary osteoarthritis, left shoulder: Secondary | ICD-10-CM | POA: Diagnosis not present

## 2018-09-02 DIAGNOSIS — I10 Essential (primary) hypertension: Secondary | ICD-10-CM | POA: Diagnosis not present

## 2018-09-02 DIAGNOSIS — J449 Chronic obstructive pulmonary disease, unspecified: Secondary | ICD-10-CM | POA: Diagnosis not present

## 2018-09-02 DIAGNOSIS — M47896 Other spondylosis, lumbar region: Secondary | ICD-10-CM | POA: Diagnosis not present

## 2018-09-02 DIAGNOSIS — G4733 Obstructive sleep apnea (adult) (pediatric): Secondary | ICD-10-CM | POA: Diagnosis not present

## 2018-09-02 DIAGNOSIS — F0390 Unspecified dementia without behavioral disturbance: Secondary | ICD-10-CM | POA: Diagnosis not present

## 2018-09-02 DIAGNOSIS — M19011 Primary osteoarthritis, right shoulder: Secondary | ICD-10-CM | POA: Diagnosis not present

## 2018-09-03 DIAGNOSIS — J449 Chronic obstructive pulmonary disease, unspecified: Secondary | ICD-10-CM | POA: Diagnosis not present

## 2018-09-03 DIAGNOSIS — I10 Essential (primary) hypertension: Secondary | ICD-10-CM | POA: Diagnosis not present

## 2018-09-03 DIAGNOSIS — M19012 Primary osteoarthritis, left shoulder: Secondary | ICD-10-CM | POA: Diagnosis not present

## 2018-09-03 DIAGNOSIS — G4733 Obstructive sleep apnea (adult) (pediatric): Secondary | ICD-10-CM | POA: Diagnosis not present

## 2018-09-03 DIAGNOSIS — G8929 Other chronic pain: Secondary | ICD-10-CM | POA: Diagnosis not present

## 2018-09-03 DIAGNOSIS — F0390 Unspecified dementia without behavioral disturbance: Secondary | ICD-10-CM | POA: Diagnosis not present

## 2018-09-03 DIAGNOSIS — I6523 Occlusion and stenosis of bilateral carotid arteries: Secondary | ICD-10-CM | POA: Diagnosis not present

## 2018-09-03 DIAGNOSIS — M47896 Other spondylosis, lumbar region: Secondary | ICD-10-CM | POA: Diagnosis not present

## 2018-09-03 DIAGNOSIS — M19011 Primary osteoarthritis, right shoulder: Secondary | ICD-10-CM | POA: Diagnosis not present

## 2018-09-08 DIAGNOSIS — J449 Chronic obstructive pulmonary disease, unspecified: Secondary | ICD-10-CM | POA: Diagnosis not present

## 2018-09-08 DIAGNOSIS — I10 Essential (primary) hypertension: Secondary | ICD-10-CM | POA: Diagnosis not present

## 2018-09-08 DIAGNOSIS — I6523 Occlusion and stenosis of bilateral carotid arteries: Secondary | ICD-10-CM | POA: Diagnosis not present

## 2018-09-08 DIAGNOSIS — M19012 Primary osteoarthritis, left shoulder: Secondary | ICD-10-CM | POA: Diagnosis not present

## 2018-09-08 DIAGNOSIS — G4733 Obstructive sleep apnea (adult) (pediatric): Secondary | ICD-10-CM | POA: Diagnosis not present

## 2018-09-08 DIAGNOSIS — M47896 Other spondylosis, lumbar region: Secondary | ICD-10-CM | POA: Diagnosis not present

## 2018-09-08 DIAGNOSIS — M19011 Primary osteoarthritis, right shoulder: Secondary | ICD-10-CM | POA: Diagnosis not present

## 2018-09-08 DIAGNOSIS — G8929 Other chronic pain: Secondary | ICD-10-CM | POA: Diagnosis not present

## 2018-09-08 DIAGNOSIS — F0390 Unspecified dementia without behavioral disturbance: Secondary | ICD-10-CM | POA: Diagnosis not present

## 2018-09-12 DIAGNOSIS — M47896 Other spondylosis, lumbar region: Secondary | ICD-10-CM | POA: Diagnosis not present

## 2018-09-12 DIAGNOSIS — G8929 Other chronic pain: Secondary | ICD-10-CM | POA: Diagnosis not present

## 2018-09-12 DIAGNOSIS — F0390 Unspecified dementia without behavioral disturbance: Secondary | ICD-10-CM | POA: Diagnosis not present

## 2018-09-12 DIAGNOSIS — J449 Chronic obstructive pulmonary disease, unspecified: Secondary | ICD-10-CM | POA: Diagnosis not present

## 2018-09-12 DIAGNOSIS — M19011 Primary osteoarthritis, right shoulder: Secondary | ICD-10-CM | POA: Diagnosis not present

## 2018-09-12 DIAGNOSIS — I6523 Occlusion and stenosis of bilateral carotid arteries: Secondary | ICD-10-CM | POA: Diagnosis not present

## 2018-09-12 DIAGNOSIS — M19012 Primary osteoarthritis, left shoulder: Secondary | ICD-10-CM | POA: Diagnosis not present

## 2018-09-12 DIAGNOSIS — G4733 Obstructive sleep apnea (adult) (pediatric): Secondary | ICD-10-CM | POA: Diagnosis not present

## 2018-09-12 DIAGNOSIS — I10 Essential (primary) hypertension: Secondary | ICD-10-CM | POA: Diagnosis not present

## 2018-09-16 DIAGNOSIS — H3530 Unspecified macular degeneration: Secondary | ICD-10-CM | POA: Diagnosis not present

## 2018-09-16 DIAGNOSIS — I6523 Occlusion and stenosis of bilateral carotid arteries: Secondary | ICD-10-CM | POA: Diagnosis not present

## 2018-09-16 DIAGNOSIS — I77810 Thoracic aortic ectasia: Secondary | ICD-10-CM | POA: Diagnosis not present

## 2018-09-16 DIAGNOSIS — I10 Essential (primary) hypertension: Secondary | ICD-10-CM | POA: Diagnosis not present

## 2018-09-16 DIAGNOSIS — G8929 Other chronic pain: Secondary | ICD-10-CM | POA: Diagnosis not present

## 2018-09-16 DIAGNOSIS — J449 Chronic obstructive pulmonary disease, unspecified: Secondary | ICD-10-CM | POA: Diagnosis not present

## 2018-09-16 DIAGNOSIS — M19011 Primary osteoarthritis, right shoulder: Secondary | ICD-10-CM | POA: Diagnosis not present

## 2018-09-16 DIAGNOSIS — G4733 Obstructive sleep apnea (adult) (pediatric): Secondary | ICD-10-CM | POA: Diagnosis not present

## 2018-09-16 DIAGNOSIS — F0390 Unspecified dementia without behavioral disturbance: Secondary | ICD-10-CM | POA: Diagnosis not present

## 2018-09-22 ENCOUNTER — Ambulatory Visit: Payer: Medicare HMO | Admitting: Internal Medicine

## 2018-09-22 DIAGNOSIS — G8929 Other chronic pain: Secondary | ICD-10-CM | POA: Diagnosis not present

## 2018-09-22 DIAGNOSIS — J449 Chronic obstructive pulmonary disease, unspecified: Secondary | ICD-10-CM | POA: Diagnosis not present

## 2018-09-22 DIAGNOSIS — H3530 Unspecified macular degeneration: Secondary | ICD-10-CM | POA: Diagnosis not present

## 2018-09-22 DIAGNOSIS — I6523 Occlusion and stenosis of bilateral carotid arteries: Secondary | ICD-10-CM | POA: Diagnosis not present

## 2018-09-22 DIAGNOSIS — I10 Essential (primary) hypertension: Secondary | ICD-10-CM | POA: Diagnosis not present

## 2018-09-22 DIAGNOSIS — M47896 Other spondylosis, lumbar region: Secondary | ICD-10-CM | POA: Diagnosis not present

## 2018-09-22 DIAGNOSIS — F0390 Unspecified dementia without behavioral disturbance: Secondary | ICD-10-CM | POA: Diagnosis not present

## 2018-09-22 DIAGNOSIS — G4733 Obstructive sleep apnea (adult) (pediatric): Secondary | ICD-10-CM | POA: Diagnosis not present

## 2018-09-22 DIAGNOSIS — M19011 Primary osteoarthritis, right shoulder: Secondary | ICD-10-CM | POA: Diagnosis not present

## 2018-09-24 DIAGNOSIS — F0390 Unspecified dementia without behavioral disturbance: Secondary | ICD-10-CM | POA: Diagnosis not present

## 2018-09-24 DIAGNOSIS — M19011 Primary osteoarthritis, right shoulder: Secondary | ICD-10-CM | POA: Diagnosis not present

## 2018-09-24 DIAGNOSIS — I6523 Occlusion and stenosis of bilateral carotid arteries: Secondary | ICD-10-CM | POA: Diagnosis not present

## 2018-09-24 DIAGNOSIS — J449 Chronic obstructive pulmonary disease, unspecified: Secondary | ICD-10-CM | POA: Diagnosis not present

## 2018-09-24 DIAGNOSIS — H3530 Unspecified macular degeneration: Secondary | ICD-10-CM | POA: Diagnosis not present

## 2018-09-24 DIAGNOSIS — I10 Essential (primary) hypertension: Secondary | ICD-10-CM | POA: Diagnosis not present

## 2018-09-24 DIAGNOSIS — M47896 Other spondylosis, lumbar region: Secondary | ICD-10-CM | POA: Diagnosis not present

## 2018-09-24 DIAGNOSIS — G4733 Obstructive sleep apnea (adult) (pediatric): Secondary | ICD-10-CM | POA: Diagnosis not present

## 2018-09-24 DIAGNOSIS — G8929 Other chronic pain: Secondary | ICD-10-CM | POA: Diagnosis not present

## 2018-09-29 DIAGNOSIS — I6523 Occlusion and stenosis of bilateral carotid arteries: Secondary | ICD-10-CM | POA: Diagnosis not present

## 2018-09-29 DIAGNOSIS — M23006 Cystic meniscus, unspecified meniscus, right knee: Secondary | ICD-10-CM | POA: Diagnosis not present

## 2018-09-29 DIAGNOSIS — H3530 Unspecified macular degeneration: Secondary | ICD-10-CM | POA: Diagnosis not present

## 2018-09-29 DIAGNOSIS — I10 Essential (primary) hypertension: Secondary | ICD-10-CM | POA: Diagnosis not present

## 2018-09-29 DIAGNOSIS — M19011 Primary osteoarthritis, right shoulder: Secondary | ICD-10-CM | POA: Diagnosis not present

## 2018-09-29 DIAGNOSIS — J449 Chronic obstructive pulmonary disease, unspecified: Secondary | ICD-10-CM | POA: Diagnosis not present

## 2018-09-29 DIAGNOSIS — M25561 Pain in right knee: Secondary | ICD-10-CM | POA: Diagnosis not present

## 2018-09-29 DIAGNOSIS — M47896 Other spondylosis, lumbar region: Secondary | ICD-10-CM | POA: Diagnosis not present

## 2018-09-29 DIAGNOSIS — G4733 Obstructive sleep apnea (adult) (pediatric): Secondary | ICD-10-CM | POA: Diagnosis not present

## 2018-09-29 DIAGNOSIS — F0390 Unspecified dementia without behavioral disturbance: Secondary | ICD-10-CM | POA: Diagnosis not present

## 2018-09-29 DIAGNOSIS — G8929 Other chronic pain: Secondary | ICD-10-CM | POA: Diagnosis not present

## 2018-10-01 DIAGNOSIS — F0390 Unspecified dementia without behavioral disturbance: Secondary | ICD-10-CM | POA: Diagnosis not present

## 2018-10-01 DIAGNOSIS — M19011 Primary osteoarthritis, right shoulder: Secondary | ICD-10-CM | POA: Diagnosis not present

## 2018-10-01 DIAGNOSIS — G4733 Obstructive sleep apnea (adult) (pediatric): Secondary | ICD-10-CM | POA: Diagnosis not present

## 2018-10-01 DIAGNOSIS — I10 Essential (primary) hypertension: Secondary | ICD-10-CM | POA: Diagnosis not present

## 2018-10-01 DIAGNOSIS — G8929 Other chronic pain: Secondary | ICD-10-CM | POA: Diagnosis not present

## 2018-10-01 DIAGNOSIS — J449 Chronic obstructive pulmonary disease, unspecified: Secondary | ICD-10-CM | POA: Diagnosis not present

## 2018-10-01 DIAGNOSIS — H3530 Unspecified macular degeneration: Secondary | ICD-10-CM | POA: Diagnosis not present

## 2018-10-01 DIAGNOSIS — I6523 Occlusion and stenosis of bilateral carotid arteries: Secondary | ICD-10-CM | POA: Diagnosis not present

## 2018-10-01 DIAGNOSIS — M47896 Other spondylosis, lumbar region: Secondary | ICD-10-CM | POA: Diagnosis not present

## 2018-10-06 DIAGNOSIS — I10 Essential (primary) hypertension: Secondary | ICD-10-CM | POA: Diagnosis not present

## 2018-10-06 DIAGNOSIS — G4733 Obstructive sleep apnea (adult) (pediatric): Secondary | ICD-10-CM | POA: Diagnosis not present

## 2018-10-06 DIAGNOSIS — G8929 Other chronic pain: Secondary | ICD-10-CM | POA: Diagnosis not present

## 2018-10-06 DIAGNOSIS — M47896 Other spondylosis, lumbar region: Secondary | ICD-10-CM | POA: Diagnosis not present

## 2018-10-06 DIAGNOSIS — J449 Chronic obstructive pulmonary disease, unspecified: Secondary | ICD-10-CM | POA: Diagnosis not present

## 2018-10-06 DIAGNOSIS — F0390 Unspecified dementia without behavioral disturbance: Secondary | ICD-10-CM | POA: Diagnosis not present

## 2018-10-06 DIAGNOSIS — H3530 Unspecified macular degeneration: Secondary | ICD-10-CM | POA: Diagnosis not present

## 2018-10-06 DIAGNOSIS — M19011 Primary osteoarthritis, right shoulder: Secondary | ICD-10-CM | POA: Diagnosis not present

## 2018-10-06 DIAGNOSIS — I6523 Occlusion and stenosis of bilateral carotid arteries: Secondary | ICD-10-CM | POA: Diagnosis not present

## 2018-10-08 DIAGNOSIS — M47896 Other spondylosis, lumbar region: Secondary | ICD-10-CM | POA: Diagnosis not present

## 2018-10-08 DIAGNOSIS — G4733 Obstructive sleep apnea (adult) (pediatric): Secondary | ICD-10-CM | POA: Diagnosis not present

## 2018-10-08 DIAGNOSIS — J449 Chronic obstructive pulmonary disease, unspecified: Secondary | ICD-10-CM | POA: Diagnosis not present

## 2018-10-08 DIAGNOSIS — I6523 Occlusion and stenosis of bilateral carotid arteries: Secondary | ICD-10-CM | POA: Diagnosis not present

## 2018-10-08 DIAGNOSIS — F0390 Unspecified dementia without behavioral disturbance: Secondary | ICD-10-CM | POA: Diagnosis not present

## 2018-10-08 DIAGNOSIS — M19011 Primary osteoarthritis, right shoulder: Secondary | ICD-10-CM | POA: Diagnosis not present

## 2018-10-08 DIAGNOSIS — H3530 Unspecified macular degeneration: Secondary | ICD-10-CM | POA: Diagnosis not present

## 2018-10-08 DIAGNOSIS — I10 Essential (primary) hypertension: Secondary | ICD-10-CM | POA: Diagnosis not present

## 2018-10-08 DIAGNOSIS — G8929 Other chronic pain: Secondary | ICD-10-CM | POA: Diagnosis not present

## 2018-10-13 DIAGNOSIS — M19011 Primary osteoarthritis, right shoulder: Secondary | ICD-10-CM | POA: Diagnosis not present

## 2018-10-13 DIAGNOSIS — M47896 Other spondylosis, lumbar region: Secondary | ICD-10-CM | POA: Diagnosis not present

## 2018-10-13 DIAGNOSIS — G4733 Obstructive sleep apnea (adult) (pediatric): Secondary | ICD-10-CM | POA: Diagnosis not present

## 2018-10-13 DIAGNOSIS — I10 Essential (primary) hypertension: Secondary | ICD-10-CM | POA: Diagnosis not present

## 2018-10-13 DIAGNOSIS — H3530 Unspecified macular degeneration: Secondary | ICD-10-CM | POA: Diagnosis not present

## 2018-10-13 DIAGNOSIS — G8929 Other chronic pain: Secondary | ICD-10-CM | POA: Diagnosis not present

## 2018-10-13 DIAGNOSIS — F0390 Unspecified dementia without behavioral disturbance: Secondary | ICD-10-CM | POA: Diagnosis not present

## 2018-10-13 DIAGNOSIS — I6523 Occlusion and stenosis of bilateral carotid arteries: Secondary | ICD-10-CM | POA: Diagnosis not present

## 2018-10-13 DIAGNOSIS — J449 Chronic obstructive pulmonary disease, unspecified: Secondary | ICD-10-CM | POA: Diagnosis not present

## 2018-10-15 DIAGNOSIS — H3530 Unspecified macular degeneration: Secondary | ICD-10-CM | POA: Diagnosis not present

## 2018-10-15 DIAGNOSIS — M19011 Primary osteoarthritis, right shoulder: Secondary | ICD-10-CM | POA: Diagnosis not present

## 2018-10-15 DIAGNOSIS — M47896 Other spondylosis, lumbar region: Secondary | ICD-10-CM | POA: Diagnosis not present

## 2018-10-15 DIAGNOSIS — F0390 Unspecified dementia without behavioral disturbance: Secondary | ICD-10-CM | POA: Diagnosis not present

## 2018-10-15 DIAGNOSIS — G4733 Obstructive sleep apnea (adult) (pediatric): Secondary | ICD-10-CM | POA: Diagnosis not present

## 2018-10-15 DIAGNOSIS — G8929 Other chronic pain: Secondary | ICD-10-CM | POA: Diagnosis not present

## 2018-10-15 DIAGNOSIS — J449 Chronic obstructive pulmonary disease, unspecified: Secondary | ICD-10-CM | POA: Diagnosis not present

## 2018-10-15 DIAGNOSIS — I6523 Occlusion and stenosis of bilateral carotid arteries: Secondary | ICD-10-CM | POA: Diagnosis not present

## 2018-10-15 DIAGNOSIS — I10 Essential (primary) hypertension: Secondary | ICD-10-CM | POA: Diagnosis not present

## 2018-10-16 DIAGNOSIS — F0280 Dementia in other diseases classified elsewhere without behavioral disturbance: Secondary | ICD-10-CM | POA: Diagnosis not present

## 2018-10-16 DIAGNOSIS — J449 Chronic obstructive pulmonary disease, unspecified: Secondary | ICD-10-CM | POA: Diagnosis not present

## 2018-10-16 DIAGNOSIS — N390 Urinary tract infection, site not specified: Secondary | ICD-10-CM | POA: Diagnosis not present

## 2018-10-16 DIAGNOSIS — F0390 Unspecified dementia without behavioral disturbance: Secondary | ICD-10-CM | POA: Diagnosis not present

## 2018-10-16 DIAGNOSIS — M1711 Unilateral primary osteoarthritis, right knee: Secondary | ICD-10-CM | POA: Diagnosis not present

## 2018-10-16 DIAGNOSIS — R269 Unspecified abnormalities of gait and mobility: Secondary | ICD-10-CM | POA: Diagnosis not present

## 2018-10-16 DIAGNOSIS — Z043 Encounter for examination and observation following other accident: Secondary | ICD-10-CM | POA: Diagnosis not present

## 2018-10-16 DIAGNOSIS — G8929 Other chronic pain: Secondary | ICD-10-CM | POA: Diagnosis not present

## 2018-10-16 DIAGNOSIS — M25519 Pain in unspecified shoulder: Secondary | ICD-10-CM | POA: Diagnosis not present

## 2018-10-16 DIAGNOSIS — R319 Hematuria, unspecified: Secondary | ICD-10-CM | POA: Diagnosis not present

## 2018-10-16 DIAGNOSIS — M25551 Pain in right hip: Secondary | ICD-10-CM | POA: Diagnosis not present

## 2018-10-16 DIAGNOSIS — M25461 Effusion, right knee: Secondary | ICD-10-CM | POA: Diagnosis not present

## 2018-10-16 DIAGNOSIS — G309 Alzheimer's disease, unspecified: Secondary | ICD-10-CM | POA: Diagnosis not present

## 2018-10-16 DIAGNOSIS — M19012 Primary osteoarthritis, left shoulder: Secondary | ICD-10-CM | POA: Diagnosis not present

## 2018-10-20 DIAGNOSIS — I6523 Occlusion and stenosis of bilateral carotid arteries: Secondary | ICD-10-CM | POA: Diagnosis not present

## 2018-10-20 DIAGNOSIS — M47896 Other spondylosis, lumbar region: Secondary | ICD-10-CM | POA: Diagnosis not present

## 2018-10-20 DIAGNOSIS — J449 Chronic obstructive pulmonary disease, unspecified: Secondary | ICD-10-CM | POA: Diagnosis not present

## 2018-10-20 DIAGNOSIS — G4733 Obstructive sleep apnea (adult) (pediatric): Secondary | ICD-10-CM | POA: Diagnosis not present

## 2018-10-20 DIAGNOSIS — I10 Essential (primary) hypertension: Secondary | ICD-10-CM | POA: Diagnosis not present

## 2018-10-20 DIAGNOSIS — M19011 Primary osteoarthritis, right shoulder: Secondary | ICD-10-CM | POA: Diagnosis not present

## 2018-10-20 DIAGNOSIS — F0390 Unspecified dementia without behavioral disturbance: Secondary | ICD-10-CM | POA: Diagnosis not present

## 2018-10-20 DIAGNOSIS — H3530 Unspecified macular degeneration: Secondary | ICD-10-CM | POA: Diagnosis not present

## 2018-10-20 DIAGNOSIS — G8929 Other chronic pain: Secondary | ICD-10-CM | POA: Diagnosis not present

## 2018-10-27 DIAGNOSIS — H3530 Unspecified macular degeneration: Secondary | ICD-10-CM | POA: Diagnosis not present

## 2018-10-27 DIAGNOSIS — I6523 Occlusion and stenosis of bilateral carotid arteries: Secondary | ICD-10-CM | POA: Diagnosis not present

## 2018-10-27 DIAGNOSIS — M47896 Other spondylosis, lumbar region: Secondary | ICD-10-CM | POA: Diagnosis not present

## 2018-10-27 DIAGNOSIS — M19011 Primary osteoarthritis, right shoulder: Secondary | ICD-10-CM | POA: Diagnosis not present

## 2018-10-27 DIAGNOSIS — G4733 Obstructive sleep apnea (adult) (pediatric): Secondary | ICD-10-CM | POA: Diagnosis not present

## 2018-10-27 DIAGNOSIS — I10 Essential (primary) hypertension: Secondary | ICD-10-CM | POA: Diagnosis not present

## 2018-10-27 DIAGNOSIS — F0390 Unspecified dementia without behavioral disturbance: Secondary | ICD-10-CM | POA: Diagnosis not present

## 2018-10-27 DIAGNOSIS — G8929 Other chronic pain: Secondary | ICD-10-CM | POA: Diagnosis not present

## 2018-10-27 DIAGNOSIS — J449 Chronic obstructive pulmonary disease, unspecified: Secondary | ICD-10-CM | POA: Diagnosis not present

## 2018-11-03 DIAGNOSIS — M47896 Other spondylosis, lumbar region: Secondary | ICD-10-CM | POA: Diagnosis not present

## 2018-11-03 DIAGNOSIS — G8929 Other chronic pain: Secondary | ICD-10-CM | POA: Diagnosis not present

## 2018-11-03 DIAGNOSIS — I6523 Occlusion and stenosis of bilateral carotid arteries: Secondary | ICD-10-CM | POA: Diagnosis not present

## 2018-11-03 DIAGNOSIS — H3530 Unspecified macular degeneration: Secondary | ICD-10-CM | POA: Diagnosis not present

## 2018-11-03 DIAGNOSIS — M19011 Primary osteoarthritis, right shoulder: Secondary | ICD-10-CM | POA: Diagnosis not present

## 2018-11-03 DIAGNOSIS — Z882 Allergy status to sulfonamides status: Secondary | ICD-10-CM | POA: Diagnosis not present

## 2018-11-03 DIAGNOSIS — J449 Chronic obstructive pulmonary disease, unspecified: Secondary | ICD-10-CM | POA: Diagnosis not present

## 2018-11-03 DIAGNOSIS — F0390 Unspecified dementia without behavioral disturbance: Secondary | ICD-10-CM | POA: Diagnosis not present

## 2018-11-03 DIAGNOSIS — M25561 Pain in right knee: Secondary | ICD-10-CM | POA: Diagnosis not present

## 2018-11-03 DIAGNOSIS — M1711 Unilateral primary osteoarthritis, right knee: Secondary | ICD-10-CM | POA: Diagnosis not present

## 2018-11-03 DIAGNOSIS — G4733 Obstructive sleep apnea (adult) (pediatric): Secondary | ICD-10-CM | POA: Diagnosis not present

## 2018-11-03 DIAGNOSIS — M11261 Other chondrocalcinosis, right knee: Secondary | ICD-10-CM | POA: Diagnosis not present

## 2018-11-03 DIAGNOSIS — I10 Essential (primary) hypertension: Secondary | ICD-10-CM | POA: Diagnosis not present

## 2018-11-03 DIAGNOSIS — Z880 Allergy status to penicillin: Secondary | ICD-10-CM | POA: Diagnosis not present

## 2018-11-10 DIAGNOSIS — H3530 Unspecified macular degeneration: Secondary | ICD-10-CM | POA: Diagnosis not present

## 2018-11-10 DIAGNOSIS — F0390 Unspecified dementia without behavioral disturbance: Secondary | ICD-10-CM | POA: Diagnosis not present

## 2018-11-10 DIAGNOSIS — I6523 Occlusion and stenosis of bilateral carotid arteries: Secondary | ICD-10-CM | POA: Diagnosis not present

## 2018-11-10 DIAGNOSIS — M19011 Primary osteoarthritis, right shoulder: Secondary | ICD-10-CM | POA: Diagnosis not present

## 2018-11-10 DIAGNOSIS — G4733 Obstructive sleep apnea (adult) (pediatric): Secondary | ICD-10-CM | POA: Diagnosis not present

## 2018-11-10 DIAGNOSIS — G8929 Other chronic pain: Secondary | ICD-10-CM | POA: Diagnosis not present

## 2018-11-10 DIAGNOSIS — J449 Chronic obstructive pulmonary disease, unspecified: Secondary | ICD-10-CM | POA: Diagnosis not present

## 2018-11-10 DIAGNOSIS — M47896 Other spondylosis, lumbar region: Secondary | ICD-10-CM | POA: Diagnosis not present

## 2018-11-10 DIAGNOSIS — I10 Essential (primary) hypertension: Secondary | ICD-10-CM | POA: Diagnosis not present

## 2018-12-05 ENCOUNTER — Ambulatory Visit: Payer: Medicare HMO | Admitting: Internal Medicine

## 2018-12-09 ENCOUNTER — Encounter: Payer: Self-pay | Admitting: Internal Medicine

## 2018-12-09 ENCOUNTER — Other Ambulatory Visit: Payer: Self-pay

## 2018-12-09 ENCOUNTER — Ambulatory Visit (INDEPENDENT_AMBULATORY_CARE_PROVIDER_SITE_OTHER): Payer: Medicare HMO | Admitting: Internal Medicine

## 2018-12-09 DIAGNOSIS — J300 Vasomotor rhinitis: Secondary | ICD-10-CM | POA: Diagnosis not present

## 2018-12-09 DIAGNOSIS — G4733 Obstructive sleep apnea (adult) (pediatric): Secondary | ICD-10-CM

## 2018-12-09 DIAGNOSIS — J449 Chronic obstructive pulmonary disease, unspecified: Secondary | ICD-10-CM | POA: Diagnosis not present

## 2018-12-09 NOTE — Assessment & Plan Note (Signed)
At her agee this is likely vasomotor/ nonallergic. Plan- suggested otc antihistamine as needed

## 2018-12-09 NOTE — Progress Notes (Signed)
Patient ID: Nancy Cole, female    DOB: 1934-12-24, 84 y.o.   MRN: [id number]  HPI female never smoker followed for asthma/fixed obstructive airways disease, OSA, rhinitis NPSG 01/19/04-  Moderately severe obstructive sleep apnea/hypopnea syndrome, RDI 39.8 per hour with oxygen desaturation to 76%, body weight 151 pounds  CPAP titration to 9 CWP Walk Test room air 02/16/15- no desaturation below 91%. PFT 06/16/2015-moderate obstructive airways disease-FVC 2.00/90%, FEV1 1.01/61%, FEV1/FVC 0.68, TLC 78%, DLCO 75%. Insignificant response to bronchodilator --------------------------------------------------------------------------------------------  09/20/2017- 84 year old female never smoker followed for asthma/fixed obstructive airways disease, OSA, rhinitis CPAP auto 5-15/[facility]              Download 13% compliance AHI 4.2/hour -----DME [facility] Pt is not wearing CPAP nightly at [facility] in [place], [place]. DL attached.  Singulair, Anoro, Zyrtec, albuterol HFA, Has been adult care facility for 6 months.  She has stopped bothering with CPAP and says she sleeps well, comfortable without it.  Complains of vertex headache that she wakes up with, taking extra strength Tylenol.  Not sure how long the headaches have been going on.  She will discuss with her PCP at upcoming appointment. Only occasional need for albuterol rescue inhaler, feels well controlled.  Not wheezing at night.  12/09/2018-  Virtual Visit via Telephone Note  I connected with Nancy Cole on 12/09/18 at 11:30 AM EST by telephone and verified that I am speaking with the correct person using two identifiers.  Location: Patient: H Provider: O   I discussed the limitations, risks, security and privacy concerns of performing an evaluation and management service by telephone and the availability of in person appointments. I also discussed with the patient that there may be a patient responsible charge related to this  service. The patient expressed understanding and agreed to proceed.   History of Present Illness: 84 year old female never smoker followed for asthma/fixed obstructive airways disease, OSA/ quit CPAP, rhinitis, Colon Cancer -----1 year f/u Lives at [facility] 2-3x/ day recently, but usually 2-3x/ week. Albuterol hfa, Zyrtec, Singulair She no longer drives and due to arthritis in knees she walks only with walker.  Denies significant acute episodes or need for refills. Had flu vax.  Coughs occasionally with clear mucus.  Quit CPAP completely and comfortable without it.  Has some watery eyes and nose- has not been using antihistamine.    Observations/Objective:   Assessment and Plan: OSA- inactive - no longer uses CPAP Asthma- watch need for maintenance inhaler. She feels adequately controlled now.  Rhinitis- allergic or nonallergic- suggested otc antihistamine.  Follow Up Instructions: 1 year   I discussed the assessment and treatment plan with the patient. The patient was provided an opportunity to ask questions and all were answered. The patient agreed with the plan and demonstrated an understanding of the instructions.   The patient was advised to call back or seek an in-person evaluation if the symptoms worsen or if the condition fails to improve as anticipated.  I provided 18 minutes of non-face-to-face time during this encounter.   Baird Lyons, MD    Review of Systems-See HPI  + = positive Constitutional:   No-   weight loss, night sweats, fevers, chills, fatigue, lassitude. HEENT:   No-  headaches, difficulty swallowing, tooth/dental problems, sore throat,       No-  sneezing, itching, ear ache, nasal congestion, + post nasal drip,  CV:  No-   chest pain, orthopnea, PND, swelling in  lower extremities, anasarca, dizziness, palpitations Resp: + shortness of breath with exertion or at rest.              No-   productive cough,  No non-productive cough,   No-  coughing up of blood.              No-   change in color of mucus.  No- wheezing.   Skin: No-   rash or lesions. GI:  No-   heartburn, indigestion, abdominal pain, nausea, vomiting,  GU: . MS:  No-   joint pain or swelling.   Neuro- nothing unusual/note hospital workup  Psych:  No- change in mood or affect. No depression or anxiety.  No memory loss.  Objective:   Physical Exam General- Alert, Oriented, Affect-appropriate, Distress- none acute. Elderly.  Skin- rash-none, lesions- none, excoriation- none Lymphadenopathy- none Head- atraumatic            Eyes- Gross vision intact, PERRLA, conjunctivae clear secretions            Ears- Hearing, canals-normal            Nose- Clear, no-Septal dev, mucus, polyps, erosion, perforation             Throat- Mallampati III , mucosa + thrush , drainage- none, tonsils- atrophic, + partial plate  Neck- flexible , trachea midline, no stridor , thyroid nl, carotid no bruit Chest - symmetrical excursion , unlabored           Heart/CV- RRR , no murmur , no gallop  , no rub, nl s1 s2                           - JVD- none , edema- none, stasis changes- none, varices- none           Lung- clear to P&A, wheeze- none, cough- none , dullness-none, rub- none           Chest wall-  Abd- Br/ Gen/ Rectal- Not done, not indicated Extrem- cyanosis- none, clubbing, none, atrophy- none, strength- nl, + rolling walker Neuro- grossly intact to observation Assessment & Plan:

## 2018-12-09 NOTE — Assessment & Plan Note (Signed)
Inactive problem. She feels she sleeps ok and not interested in intervention.

## 2018-12-09 NOTE — Assessment & Plan Note (Signed)
Reactive component adequately controlled for now with occasional use of rescue inhaler

## 2018-12-09 NOTE — Patient Instructions (Addendum)
Hi Nancy Cole-   The current address for [facility] is: [address] [place], [place]  [address]  [phone]  Please call us if we can help.  We suggested you get an over-the-counter  antihistamine like Zyrtec or Claritin to help with your watery eyes and nose as needed.

## 2018-12-15 DIAGNOSIS — Z9189 Other specified personal risk factors, not elsewhere classified: Secondary | ICD-10-CM | POA: Diagnosis not present

## 2018-12-15 DIAGNOSIS — F0390 Unspecified dementia without behavioral disturbance: Secondary | ICD-10-CM | POA: Diagnosis not present

## 2018-12-15 DIAGNOSIS — I10 Essential (primary) hypertension: Secondary | ICD-10-CM | POA: Diagnosis not present

## 2018-12-15 DIAGNOSIS — F3341 Major depressive disorder, recurrent, in partial remission: Secondary | ICD-10-CM | POA: Diagnosis not present

## 2018-12-15 DIAGNOSIS — E039 Hypothyroidism, unspecified: Secondary | ICD-10-CM | POA: Diagnosis not present

## 2018-12-15 DIAGNOSIS — J449 Chronic obstructive pulmonary disease, unspecified: Secondary | ICD-10-CM | POA: Diagnosis not present

## 2018-12-15 DIAGNOSIS — M1990 Unspecified osteoarthritis, unspecified site: Secondary | ICD-10-CM | POA: Diagnosis not present

## 2018-12-15 DIAGNOSIS — R627 Adult failure to thrive: Secondary | ICD-10-CM | POA: Diagnosis not present

## 2019-01-10 DIAGNOSIS — N390 Urinary tract infection, site not specified: Secondary | ICD-10-CM | POA: Diagnosis not present

## 2019-01-10 DIAGNOSIS — A499 Bacterial infection, unspecified: Secondary | ICD-10-CM | POA: Diagnosis not present

## 2019-01-10 DIAGNOSIS — Z6830 Body mass index (BMI) 30.0-30.9, adult: Secondary | ICD-10-CM | POA: Diagnosis not present

## 2019-01-10 DIAGNOSIS — R300 Dysuria: Secondary | ICD-10-CM | POA: Diagnosis not present

## 2019-01-14 DIAGNOSIS — N390 Urinary tract infection, site not specified: Secondary | ICD-10-CM | POA: Diagnosis not present

## 2019-01-14 DIAGNOSIS — R918 Other nonspecific abnormal finding of lung field: Secondary | ICD-10-CM | POA: Diagnosis not present

## 2019-01-14 DIAGNOSIS — R471 Dysarthria and anarthria: Secondary | ICD-10-CM | POA: Diagnosis not present

## 2019-01-14 DIAGNOSIS — Z880 Allergy status to penicillin: Secondary | ICD-10-CM | POA: Diagnosis not present

## 2019-01-14 DIAGNOSIS — G319 Degenerative disease of nervous system, unspecified: Secondary | ICD-10-CM | POA: Diagnosis not present

## 2019-01-14 DIAGNOSIS — R9431 Abnormal electrocardiogram [ECG] [EKG]: Secondary | ICD-10-CM | POA: Diagnosis not present

## 2019-01-14 DIAGNOSIS — Z8673 Personal history of transient ischemic attack (TIA), and cerebral infarction without residual deficits: Secondary | ICD-10-CM | POA: Diagnosis not present

## 2019-01-14 DIAGNOSIS — R531 Weakness: Secondary | ICD-10-CM | POA: Diagnosis not present

## 2019-01-14 DIAGNOSIS — I6930 Unspecified sequelae of cerebral infarction: Secondary | ICD-10-CM | POA: Diagnosis not present

## 2019-01-14 DIAGNOSIS — R29702 NIHSS score 2: Secondary | ICD-10-CM | POA: Diagnosis not present

## 2019-01-14 DIAGNOSIS — F0390 Unspecified dementia without behavioral disturbance: Secondary | ICD-10-CM | POA: Diagnosis not present

## 2019-01-14 DIAGNOSIS — R29810 Facial weakness: Secondary | ICD-10-CM | POA: Diagnosis not present

## 2019-01-26 DIAGNOSIS — N390 Urinary tract infection, site not specified: Secondary | ICD-10-CM | POA: Diagnosis not present

## 2019-01-26 DIAGNOSIS — R809 Proteinuria, unspecified: Secondary | ICD-10-CM | POA: Diagnosis not present

## 2019-02-12 DIAGNOSIS — U071 COVID-19: Secondary | ICD-10-CM | POA: Diagnosis not present

## 2019-02-17 ENCOUNTER — Telehealth: Payer: Self-pay | Admitting: Internal Medicine

## 2019-02-17 MED ORDER — SPIRIVA RESPIMAT 2.5 MCG/ACT IN AERS: 2.0000 | INHALATION_SPRAY | Freq: Every day | RESPIRATORY_TRACT | 3 refills | Status: DC

## 2019-02-17 NOTE — Telephone Encounter (Signed)
Pt's brother calling to see if the patient can be switched from Anoro Ellipta to Spiriva. Pt's brother stated he wanted to see if Dr. Annamaria Boots was in agreement with the switch. Pt's brother said Dr. Virgina Jock is agreement with the switch. Pt's brother could be reached at [phone]

## 2019-02-17 NOTE — Telephone Encounter (Signed)
I called and spoke with [name] and made him aware that its ok to stop the Anoro and start the Spiriva. He requested that I send it to the mail order. I have sent this in.

## 2019-02-17 NOTE — Telephone Encounter (Signed)
OK to DC Anoro Please change her to Spiriva Respimat 2.5    inhale 2 puffs, once daily, ref x 12 ( or mail-order, # 3, ref x 3)

## 2019-02-17 NOTE — Telephone Encounter (Signed)
Dr. Annamaria Boots, please advise if you are okay with pt switching from the anoro to spiriva and if so, which spiriva dose?  Allergies  Allergen Reactions  . Sulfamethoxazole     REACTION: unknown  . Penicillins Rash    Has patient had a PCN reaction causing immediate rash, facial/tongue/throat swelling, SOB or lightheadedness with hypotension: No Has patient had a PCN reaction causing severe rash involving mucus membranes or skin necrosis: No Has patient had a PCN reaction that required hospitalization: No Has patient had a PCN reaction occurring within the last 10 years: No If all of the above answers are "NO", then may proceed with Cephalosporin use.      Current Outpatient Medications:  .  acetaminophen (TYLENOL) 500 MG tablet, Take 1,000 mg by mouth every 6 (six) hours as needed for mild pain or moderate pain. , Disp: , Rfl:  .  albuterol (PROVENTIL HFA;VENTOLIN HFA) 108 (90 Base) MCG/ACT inhaler, Inhale 2 puffs into the lungs every 6 (six) hours as needed for wheezing or shortness of breath., Disp: 3 Inhaler, Rfl: 3 .  ANORO ELLIPTA 62.5-25 MCG/INH AEPB, INHALE 1 PUFF EVERY DAY, Disp: 180 each, Rfl: 3 .  aspirin 81 MG tablet, Take 81 mg by mouth daily., Disp: , Rfl:  .  atorvastatin (LIPITOR) 20 MG tablet, Take 20 mg by mouth every morning. , Disp: , Rfl: 3 .  cetirizine (ZYRTEC) 10 MG tablet, Take 10 mg by mouth every morning. , Disp: , Rfl:  .  Cholecalciferol (VITAMIN D) 2000 UNITS CAPS, Take 1 capsule by mouth at bedtime. , Disp: , Rfl:  .  donepezil (ARICEPT) 10 MG tablet, Take 1 tablet (10 mg total) by mouth at bedtime., Disp: 90 tablet, Rfl: 3 .  levothyroxine (SYNTHROID, LEVOTHROID) 100 MCG tablet, Take 100 mcg by mouth daily.  , Disp: , Rfl:  .  memantine (NAMENDA XR) 28 MG CP24 24 hr capsule, Take 1 capsule (28 mg total) by mouth daily., Disp: 90 capsule, Rfl: 3 .  montelukast (SINGULAIR) 10 MG tablet, TAKE 1 TABLET AT BEDTIME, Disp: 90 tablet, Rfl: 0 .  potassium chloride  (K-DUR,KLOR-CON) 10 MEQ tablet, Take 20 mEq by mouth 2 (two) times daily. , Disp: , Rfl:  .  sertraline (ZOLOFT) 100 MG tablet, Take 100 mg by mouth daily., Disp: , Rfl:

## 2019-02-21 DIAGNOSIS — Z20828 Contact with and (suspected) exposure to other viral communicable diseases: Secondary | ICD-10-CM | POA: Diagnosis not present

## 2019-02-26 DIAGNOSIS — Z20828 Contact with and (suspected) exposure to other viral communicable diseases: Secondary | ICD-10-CM | POA: Diagnosis not present

## 2019-03-15 DIAGNOSIS — Z9049 Acquired absence of other specified parts of digestive tract: Secondary | ICD-10-CM | POA: Diagnosis not present

## 2019-03-15 DIAGNOSIS — R319 Hematuria, unspecified: Secondary | ICD-10-CM | POA: Diagnosis not present

## 2019-03-15 DIAGNOSIS — J449 Chronic obstructive pulmonary disease, unspecified: Secondary | ICD-10-CM | POA: Diagnosis not present

## 2019-03-15 DIAGNOSIS — R531 Weakness: Secondary | ICD-10-CM | POA: Diagnosis not present

## 2019-03-15 DIAGNOSIS — N3001 Acute cystitis with hematuria: Secondary | ICD-10-CM | POA: Diagnosis not present

## 2019-03-15 DIAGNOSIS — N3090 Cystitis, unspecified without hematuria: Secondary | ICD-10-CM | POA: Diagnosis not present

## 2019-03-15 DIAGNOSIS — F0280 Dementia in other diseases classified elsewhere without behavioral disturbance: Secondary | ICD-10-CM | POA: Diagnosis not present

## 2019-03-15 DIAGNOSIS — G309 Alzheimer's disease, unspecified: Secondary | ICD-10-CM | POA: Diagnosis not present

## 2019-03-15 DIAGNOSIS — R509 Fever, unspecified: Secondary | ICD-10-CM | POA: Diagnosis not present

## 2019-03-15 DIAGNOSIS — R4182 Altered mental status, unspecified: Secondary | ICD-10-CM | POA: Diagnosis not present

## 2019-03-15 DIAGNOSIS — I10 Essential (primary) hypertension: Secondary | ICD-10-CM | POA: Diagnosis not present

## 2019-03-15 DIAGNOSIS — M25551 Pain in right hip: Secondary | ICD-10-CM | POA: Diagnosis not present

## 2019-03-15 DIAGNOSIS — G9340 Encephalopathy, unspecified: Secondary | ICD-10-CM | POA: Diagnosis not present

## 2019-03-15 DIAGNOSIS — G308 Other Alzheimer's disease: Secondary | ICD-10-CM | POA: Diagnosis not present

## 2019-03-15 DIAGNOSIS — D72829 Elevated white blood cell count, unspecified: Secondary | ICD-10-CM | POA: Diagnosis not present

## 2019-03-15 DIAGNOSIS — R296 Repeated falls: Secondary | ICD-10-CM | POA: Diagnosis not present

## 2019-03-15 DIAGNOSIS — S0990XA Unspecified injury of head, initial encounter: Secondary | ICD-10-CM | POA: Diagnosis not present

## 2019-03-15 DIAGNOSIS — E039 Hypothyroidism, unspecified: Secondary | ICD-10-CM | POA: Diagnosis not present

## 2019-03-15 DIAGNOSIS — N1330 Unspecified hydronephrosis: Secondary | ICD-10-CM | POA: Diagnosis not present

## 2019-03-15 DIAGNOSIS — K449 Diaphragmatic hernia without obstruction or gangrene: Secondary | ICD-10-CM | POA: Diagnosis not present

## 2019-03-15 DIAGNOSIS — M25552 Pain in left hip: Secondary | ICD-10-CM | POA: Diagnosis not present

## 2019-03-15 DIAGNOSIS — M542 Cervicalgia: Secondary | ICD-10-CM | POA: Diagnosis not present

## 2019-03-15 DIAGNOSIS — Z20822 Contact with and (suspected) exposure to covid-19: Secondary | ICD-10-CM | POA: Diagnosis not present

## 2019-03-15 DIAGNOSIS — M25461 Effusion, right knee: Secondary | ICD-10-CM | POA: Diagnosis not present

## 2019-03-15 DIAGNOSIS — R2689 Other abnormalities of gait and mobility: Secondary | ICD-10-CM | POA: Diagnosis not present

## 2019-03-16 DIAGNOSIS — N3001 Acute cystitis with hematuria: Secondary | ICD-10-CM | POA: Diagnosis not present

## 2019-03-17 DIAGNOSIS — N3001 Acute cystitis with hematuria: Secondary | ICD-10-CM | POA: Diagnosis not present

## 2019-03-18 DIAGNOSIS — N3001 Acute cystitis with hematuria: Secondary | ICD-10-CM | POA: Diagnosis not present

## 2019-03-19 DIAGNOSIS — N3001 Acute cystitis with hematuria: Secondary | ICD-10-CM | POA: Diagnosis not present

## 2019-03-20 DIAGNOSIS — J441 Chronic obstructive pulmonary disease with (acute) exacerbation: Secondary | ICD-10-CM | POA: Diagnosis not present

## 2019-03-20 DIAGNOSIS — F339 Major depressive disorder, recurrent, unspecified: Secondary | ICD-10-CM | POA: Diagnosis not present

## 2019-03-20 DIAGNOSIS — G459 Transient cerebral ischemic attack, unspecified: Secondary | ICD-10-CM | POA: Diagnosis not present

## 2019-03-20 DIAGNOSIS — F0150 Vascular dementia without behavioral disturbance: Secondary | ICD-10-CM | POA: Diagnosis not present

## 2019-03-20 DIAGNOSIS — G308 Other Alzheimer's disease: Secondary | ICD-10-CM | POA: Diagnosis not present

## 2019-03-20 DIAGNOSIS — N200 Calculus of kidney: Secondary | ICD-10-CM | POA: Diagnosis not present

## 2019-03-20 DIAGNOSIS — M6281 Muscle weakness (generalized): Secondary | ICD-10-CM | POA: Diagnosis not present

## 2019-03-20 DIAGNOSIS — R531 Weakness: Secondary | ICD-10-CM | POA: Diagnosis not present

## 2019-03-20 DIAGNOSIS — G309 Alzheimer's disease, unspecified: Secondary | ICD-10-CM | POA: Diagnosis not present

## 2019-03-20 DIAGNOSIS — F0280 Dementia in other diseases classified elsewhere without behavioral disturbance: Secondary | ICD-10-CM | POA: Diagnosis not present

## 2019-03-20 DIAGNOSIS — N390 Urinary tract infection, site not specified: Secondary | ICD-10-CM | POA: Diagnosis not present

## 2019-03-20 DIAGNOSIS — J449 Chronic obstructive pulmonary disease, unspecified: Secondary | ICD-10-CM | POA: Diagnosis not present

## 2019-03-20 DIAGNOSIS — Z8673 Personal history of transient ischemic attack (TIA), and cerebral infarction without residual deficits: Secondary | ICD-10-CM | POA: Diagnosis not present

## 2019-03-20 DIAGNOSIS — Z7982 Long term (current) use of aspirin: Secondary | ICD-10-CM | POA: Diagnosis not present

## 2019-03-20 DIAGNOSIS — R279 Unspecified lack of coordination: Secondary | ICD-10-CM | POA: Diagnosis not present

## 2019-03-20 DIAGNOSIS — Z743 Need for continuous supervision: Secondary | ICD-10-CM | POA: Diagnosis not present

## 2019-03-20 DIAGNOSIS — J309 Allergic rhinitis, unspecified: Secondary | ICD-10-CM | POA: Diagnosis not present

## 2019-03-20 DIAGNOSIS — R296 Repeated falls: Secondary | ICD-10-CM | POA: Diagnosis not present

## 2019-03-20 DIAGNOSIS — I10 Essential (primary) hypertension: Secondary | ICD-10-CM | POA: Diagnosis not present

## 2019-03-20 DIAGNOSIS — E785 Hyperlipidemia, unspecified: Secondary | ICD-10-CM | POA: Diagnosis not present

## 2019-03-20 DIAGNOSIS — N3001 Acute cystitis with hematuria: Secondary | ICD-10-CM | POA: Diagnosis not present

## 2019-03-20 DIAGNOSIS — E039 Hypothyroidism, unspecified: Secondary | ICD-10-CM | POA: Diagnosis not present

## 2019-03-20 DIAGNOSIS — D72829 Elevated white blood cell count, unspecified: Secondary | ICD-10-CM | POA: Diagnosis not present

## 2019-03-20 DIAGNOSIS — G301 Alzheimer's disease with late onset: Secondary | ICD-10-CM | POA: Diagnosis not present

## 2019-03-20 DIAGNOSIS — R509 Fever, unspecified: Secondary | ICD-10-CM | POA: Diagnosis not present

## 2019-03-20 DIAGNOSIS — G4733 Obstructive sleep apnea (adult) (pediatric): Secondary | ICD-10-CM | POA: Diagnosis not present

## 2019-03-20 DIAGNOSIS — Z9181 History of falling: Secondary | ICD-10-CM | POA: Diagnosis not present

## 2019-03-24 DIAGNOSIS — Z7982 Long term (current) use of aspirin: Secondary | ICD-10-CM | POA: Diagnosis not present

## 2019-03-24 DIAGNOSIS — R296 Repeated falls: Secondary | ICD-10-CM | POA: Diagnosis not present

## 2019-03-24 DIAGNOSIS — M6281 Muscle weakness (generalized): Secondary | ICD-10-CM | POA: Diagnosis not present

## 2019-03-24 DIAGNOSIS — N390 Urinary tract infection, site not specified: Secondary | ICD-10-CM | POA: Diagnosis not present

## 2019-03-24 DIAGNOSIS — E039 Hypothyroidism, unspecified: Secondary | ICD-10-CM | POA: Diagnosis not present

## 2019-03-24 DIAGNOSIS — I10 Essential (primary) hypertension: Secondary | ICD-10-CM | POA: Diagnosis not present

## 2019-03-24 DIAGNOSIS — G4733 Obstructive sleep apnea (adult) (pediatric): Secondary | ICD-10-CM | POA: Diagnosis not present

## 2019-03-24 DIAGNOSIS — J441 Chronic obstructive pulmonary disease with (acute) exacerbation: Secondary | ICD-10-CM | POA: Diagnosis not present

## 2019-03-24 DIAGNOSIS — G309 Alzheimer's disease, unspecified: Secondary | ICD-10-CM | POA: Diagnosis not present

## 2019-03-26 DIAGNOSIS — E039 Hypothyroidism, unspecified: Secondary | ICD-10-CM | POA: Diagnosis not present

## 2019-03-26 DIAGNOSIS — I10 Essential (primary) hypertension: Secondary | ICD-10-CM | POA: Diagnosis not present

## 2019-03-26 DIAGNOSIS — J441 Chronic obstructive pulmonary disease with (acute) exacerbation: Secondary | ICD-10-CM | POA: Diagnosis not present

## 2019-03-26 DIAGNOSIS — M6281 Muscle weakness (generalized): Secondary | ICD-10-CM | POA: Diagnosis not present

## 2019-03-26 DIAGNOSIS — G309 Alzheimer's disease, unspecified: Secondary | ICD-10-CM | POA: Diagnosis not present

## 2019-03-26 DIAGNOSIS — G4733 Obstructive sleep apnea (adult) (pediatric): Secondary | ICD-10-CM | POA: Diagnosis not present

## 2019-03-26 DIAGNOSIS — Z9181 History of falling: Secondary | ICD-10-CM | POA: Diagnosis not present

## 2019-03-26 DIAGNOSIS — Z8673 Personal history of transient ischemic attack (TIA), and cerebral infarction without residual deficits: Secondary | ICD-10-CM | POA: Diagnosis not present

## 2019-03-27 DIAGNOSIS — N390 Urinary tract infection, site not specified: Secondary | ICD-10-CM | POA: Diagnosis not present

## 2019-03-27 DIAGNOSIS — Z7982 Long term (current) use of aspirin: Secondary | ICD-10-CM | POA: Diagnosis not present

## 2019-03-27 DIAGNOSIS — E039 Hypothyroidism, unspecified: Secondary | ICD-10-CM | POA: Diagnosis not present

## 2019-03-27 DIAGNOSIS — E785 Hyperlipidemia, unspecified: Secondary | ICD-10-CM | POA: Diagnosis not present

## 2019-03-27 DIAGNOSIS — G309 Alzheimer's disease, unspecified: Secondary | ICD-10-CM | POA: Diagnosis not present

## 2019-03-27 DIAGNOSIS — M6281 Muscle weakness (generalized): Secondary | ICD-10-CM | POA: Diagnosis not present

## 2019-03-27 DIAGNOSIS — I10 Essential (primary) hypertension: Secondary | ICD-10-CM | POA: Diagnosis not present

## 2019-03-27 DIAGNOSIS — G4733 Obstructive sleep apnea (adult) (pediatric): Secondary | ICD-10-CM | POA: Diagnosis not present

## 2019-03-27 DIAGNOSIS — F339 Major depressive disorder, recurrent, unspecified: Secondary | ICD-10-CM | POA: Diagnosis not present

## 2019-03-31 DIAGNOSIS — I10 Essential (primary) hypertension: Secondary | ICD-10-CM | POA: Diagnosis not present

## 2019-03-31 DIAGNOSIS — N390 Urinary tract infection, site not specified: Secondary | ICD-10-CM | POA: Diagnosis not present

## 2019-03-31 DIAGNOSIS — M6281 Muscle weakness (generalized): Secondary | ICD-10-CM | POA: Diagnosis not present

## 2019-03-31 DIAGNOSIS — G4733 Obstructive sleep apnea (adult) (pediatric): Secondary | ICD-10-CM | POA: Diagnosis not present

## 2019-03-31 DIAGNOSIS — F339 Major depressive disorder, recurrent, unspecified: Secondary | ICD-10-CM | POA: Diagnosis not present

## 2019-03-31 DIAGNOSIS — E785 Hyperlipidemia, unspecified: Secondary | ICD-10-CM | POA: Diagnosis not present

## 2019-03-31 DIAGNOSIS — E039 Hypothyroidism, unspecified: Secondary | ICD-10-CM | POA: Diagnosis not present

## 2019-03-31 DIAGNOSIS — Z7982 Long term (current) use of aspirin: Secondary | ICD-10-CM | POA: Diagnosis not present

## 2019-03-31 DIAGNOSIS — Z9181 History of falling: Secondary | ICD-10-CM | POA: Diagnosis not present

## 2019-04-03 DIAGNOSIS — R296 Repeated falls: Secondary | ICD-10-CM | POA: Diagnosis not present

## 2019-04-03 DIAGNOSIS — E039 Hypothyroidism, unspecified: Secondary | ICD-10-CM | POA: Diagnosis not present

## 2019-04-03 DIAGNOSIS — Z7982 Long term (current) use of aspirin: Secondary | ICD-10-CM | POA: Diagnosis not present

## 2019-04-03 DIAGNOSIS — I10 Essential (primary) hypertension: Secondary | ICD-10-CM | POA: Diagnosis not present

## 2019-04-03 DIAGNOSIS — J309 Allergic rhinitis, unspecified: Secondary | ICD-10-CM | POA: Diagnosis not present

## 2019-04-03 DIAGNOSIS — G4733 Obstructive sleep apnea (adult) (pediatric): Secondary | ICD-10-CM | POA: Diagnosis not present

## 2019-04-03 DIAGNOSIS — E785 Hyperlipidemia, unspecified: Secondary | ICD-10-CM | POA: Diagnosis not present

## 2019-04-03 DIAGNOSIS — M6281 Muscle weakness (generalized): Secondary | ICD-10-CM | POA: Diagnosis not present

## 2019-04-03 DIAGNOSIS — Z9181 History of falling: Secondary | ICD-10-CM | POA: Diagnosis not present

## 2019-04-10 DIAGNOSIS — Z9181 History of falling: Secondary | ICD-10-CM | POA: Diagnosis not present

## 2019-04-10 DIAGNOSIS — E039 Hypothyroidism, unspecified: Secondary | ICD-10-CM | POA: Diagnosis not present

## 2019-04-10 DIAGNOSIS — I10 Essential (primary) hypertension: Secondary | ICD-10-CM | POA: Diagnosis not present

## 2019-04-10 DIAGNOSIS — M6281 Muscle weakness (generalized): Secondary | ICD-10-CM | POA: Diagnosis not present

## 2019-04-10 DIAGNOSIS — R296 Repeated falls: Secondary | ICD-10-CM | POA: Diagnosis not present

## 2019-04-10 DIAGNOSIS — J309 Allergic rhinitis, unspecified: Secondary | ICD-10-CM | POA: Diagnosis not present

## 2019-04-10 DIAGNOSIS — E785 Hyperlipidemia, unspecified: Secondary | ICD-10-CM | POA: Diagnosis not present

## 2019-04-10 DIAGNOSIS — Z7982 Long term (current) use of aspirin: Secondary | ICD-10-CM | POA: Diagnosis not present

## 2019-04-10 DIAGNOSIS — G4733 Obstructive sleep apnea (adult) (pediatric): Secondary | ICD-10-CM | POA: Diagnosis not present

## 2019-04-20 DIAGNOSIS — N200 Calculus of kidney: Secondary | ICD-10-CM | POA: Diagnosis not present

## 2019-04-20 DIAGNOSIS — N3001 Acute cystitis with hematuria: Secondary | ICD-10-CM | POA: Diagnosis not present

## 2019-04-27 DIAGNOSIS — F0280 Dementia in other diseases classified elsewhere without behavioral disturbance: Secondary | ICD-10-CM | POA: Diagnosis not present

## 2019-04-27 DIAGNOSIS — J449 Chronic obstructive pulmonary disease, unspecified: Secondary | ICD-10-CM | POA: Diagnosis not present

## 2019-04-27 DIAGNOSIS — Z7982 Long term (current) use of aspirin: Secondary | ICD-10-CM | POA: Diagnosis not present

## 2019-04-27 DIAGNOSIS — E039 Hypothyroidism, unspecified: Secondary | ICD-10-CM | POA: Diagnosis not present

## 2019-04-27 DIAGNOSIS — I10 Essential (primary) hypertension: Secondary | ICD-10-CM | POA: Diagnosis not present

## 2019-04-27 DIAGNOSIS — G309 Alzheimer's disease, unspecified: Secondary | ICD-10-CM | POA: Diagnosis not present

## 2019-04-27 DIAGNOSIS — F0150 Vascular dementia without behavioral disturbance: Secondary | ICD-10-CM | POA: Diagnosis not present

## 2019-04-27 DIAGNOSIS — G4733 Obstructive sleep apnea (adult) (pediatric): Secondary | ICD-10-CM | POA: Diagnosis not present

## 2019-04-27 DIAGNOSIS — G319 Degenerative disease of nervous system, unspecified: Secondary | ICD-10-CM | POA: Diagnosis not present

## 2019-04-28 DIAGNOSIS — G309 Alzheimer's disease, unspecified: Secondary | ICD-10-CM | POA: Diagnosis not present

## 2019-04-28 DIAGNOSIS — I10 Essential (primary) hypertension: Secondary | ICD-10-CM | POA: Diagnosis not present

## 2019-04-28 DIAGNOSIS — J449 Chronic obstructive pulmonary disease, unspecified: Secondary | ICD-10-CM | POA: Diagnosis not present

## 2019-04-28 DIAGNOSIS — F0280 Dementia in other diseases classified elsewhere without behavioral disturbance: Secondary | ICD-10-CM | POA: Diagnosis not present

## 2019-04-29 DIAGNOSIS — Z1159 Encounter for screening for other viral diseases: Secondary | ICD-10-CM | POA: Diagnosis not present

## 2019-04-29 DIAGNOSIS — G319 Degenerative disease of nervous system, unspecified: Secondary | ICD-10-CM | POA: Diagnosis not present

## 2019-04-29 DIAGNOSIS — E039 Hypothyroidism, unspecified: Secondary | ICD-10-CM | POA: Diagnosis not present

## 2019-04-29 DIAGNOSIS — Z7982 Long term (current) use of aspirin: Secondary | ICD-10-CM | POA: Diagnosis not present

## 2019-04-29 DIAGNOSIS — J449 Chronic obstructive pulmonary disease, unspecified: Secondary | ICD-10-CM | POA: Diagnosis not present

## 2019-04-29 DIAGNOSIS — G4733 Obstructive sleep apnea (adult) (pediatric): Secondary | ICD-10-CM | POA: Diagnosis not present

## 2019-04-29 DIAGNOSIS — F0280 Dementia in other diseases classified elsewhere without behavioral disturbance: Secondary | ICD-10-CM | POA: Diagnosis not present

## 2019-04-29 DIAGNOSIS — G309 Alzheimer's disease, unspecified: Secondary | ICD-10-CM | POA: Diagnosis not present

## 2019-04-29 DIAGNOSIS — F0150 Vascular dementia without behavioral disturbance: Secondary | ICD-10-CM | POA: Diagnosis not present

## 2019-04-29 DIAGNOSIS — I10 Essential (primary) hypertension: Secondary | ICD-10-CM | POA: Diagnosis not present

## 2019-04-30 DIAGNOSIS — E559 Vitamin D deficiency, unspecified: Secondary | ICD-10-CM | POA: Diagnosis not present

## 2019-04-30 DIAGNOSIS — E119 Type 2 diabetes mellitus without complications: Secondary | ICD-10-CM | POA: Diagnosis not present

## 2019-05-01 DIAGNOSIS — F3289 Other specified depressive episodes: Secondary | ICD-10-CM | POA: Diagnosis not present

## 2019-05-01 DIAGNOSIS — F0280 Dementia in other diseases classified elsewhere without behavioral disturbance: Secondary | ICD-10-CM | POA: Diagnosis not present

## 2019-05-04 DIAGNOSIS — G309 Alzheimer's disease, unspecified: Secondary | ICD-10-CM | POA: Diagnosis not present

## 2019-05-04 DIAGNOSIS — Z7982 Long term (current) use of aspirin: Secondary | ICD-10-CM | POA: Diagnosis not present

## 2019-05-04 DIAGNOSIS — G319 Degenerative disease of nervous system, unspecified: Secondary | ICD-10-CM | POA: Diagnosis not present

## 2019-05-04 DIAGNOSIS — J449 Chronic obstructive pulmonary disease, unspecified: Secondary | ICD-10-CM | POA: Diagnosis not present

## 2019-05-04 DIAGNOSIS — G4733 Obstructive sleep apnea (adult) (pediatric): Secondary | ICD-10-CM | POA: Diagnosis not present

## 2019-05-04 DIAGNOSIS — E039 Hypothyroidism, unspecified: Secondary | ICD-10-CM | POA: Diagnosis not present

## 2019-05-04 DIAGNOSIS — F0280 Dementia in other diseases classified elsewhere without behavioral disturbance: Secondary | ICD-10-CM | POA: Diagnosis not present

## 2019-05-04 DIAGNOSIS — F0150 Vascular dementia without behavioral disturbance: Secondary | ICD-10-CM | POA: Diagnosis not present

## 2019-05-04 DIAGNOSIS — I10 Essential (primary) hypertension: Secondary | ICD-10-CM | POA: Diagnosis not present

## 2019-05-05 DIAGNOSIS — G309 Alzheimer's disease, unspecified: Secondary | ICD-10-CM | POA: Diagnosis not present

## 2019-05-05 DIAGNOSIS — J449 Chronic obstructive pulmonary disease, unspecified: Secondary | ICD-10-CM | POA: Diagnosis not present

## 2019-05-05 DIAGNOSIS — J00 Acute nasopharyngitis [common cold]: Secondary | ICD-10-CM | POA: Diagnosis not present

## 2019-05-06 DIAGNOSIS — Z7982 Long term (current) use of aspirin: Secondary | ICD-10-CM | POA: Diagnosis not present

## 2019-05-06 DIAGNOSIS — F0150 Vascular dementia without behavioral disturbance: Secondary | ICD-10-CM | POA: Diagnosis not present

## 2019-05-06 DIAGNOSIS — F0280 Dementia in other diseases classified elsewhere without behavioral disturbance: Secondary | ICD-10-CM | POA: Diagnosis not present

## 2019-05-06 DIAGNOSIS — G319 Degenerative disease of nervous system, unspecified: Secondary | ICD-10-CM | POA: Diagnosis not present

## 2019-05-06 DIAGNOSIS — G309 Alzheimer's disease, unspecified: Secondary | ICD-10-CM | POA: Diagnosis not present

## 2019-05-06 DIAGNOSIS — E039 Hypothyroidism, unspecified: Secondary | ICD-10-CM | POA: Diagnosis not present

## 2019-05-06 DIAGNOSIS — J449 Chronic obstructive pulmonary disease, unspecified: Secondary | ICD-10-CM | POA: Diagnosis not present

## 2019-05-06 DIAGNOSIS — G4733 Obstructive sleep apnea (adult) (pediatric): Secondary | ICD-10-CM | POA: Diagnosis not present

## 2019-05-06 DIAGNOSIS — I10 Essential (primary) hypertension: Secondary | ICD-10-CM | POA: Diagnosis not present

## 2019-05-06 DIAGNOSIS — N390 Urinary tract infection, site not specified: Secondary | ICD-10-CM | POA: Diagnosis not present

## 2019-05-11 DIAGNOSIS — J449 Chronic obstructive pulmonary disease, unspecified: Secondary | ICD-10-CM | POA: Diagnosis not present

## 2019-05-11 DIAGNOSIS — G309 Alzheimer's disease, unspecified: Secondary | ICD-10-CM | POA: Diagnosis not present

## 2019-05-11 DIAGNOSIS — E039 Hypothyroidism, unspecified: Secondary | ICD-10-CM | POA: Diagnosis not present

## 2019-05-11 DIAGNOSIS — F0150 Vascular dementia without behavioral disturbance: Secondary | ICD-10-CM | POA: Diagnosis not present

## 2019-05-11 DIAGNOSIS — I10 Essential (primary) hypertension: Secondary | ICD-10-CM | POA: Diagnosis not present

## 2019-05-11 DIAGNOSIS — F0280 Dementia in other diseases classified elsewhere without behavioral disturbance: Secondary | ICD-10-CM | POA: Diagnosis not present

## 2019-05-11 DIAGNOSIS — G319 Degenerative disease of nervous system, unspecified: Secondary | ICD-10-CM | POA: Diagnosis not present

## 2019-05-11 DIAGNOSIS — Z7982 Long term (current) use of aspirin: Secondary | ICD-10-CM | POA: Diagnosis not present

## 2019-05-11 DIAGNOSIS — G4733 Obstructive sleep apnea (adult) (pediatric): Secondary | ICD-10-CM | POA: Diagnosis not present

## 2019-05-12 DIAGNOSIS — J449 Chronic obstructive pulmonary disease, unspecified: Secondary | ICD-10-CM | POA: Diagnosis not present

## 2019-05-12 DIAGNOSIS — I679 Cerebrovascular disease, unspecified: Secondary | ICD-10-CM | POA: Diagnosis not present

## 2019-05-12 DIAGNOSIS — I10 Essential (primary) hypertension: Secondary | ICD-10-CM | POA: Diagnosis not present

## 2019-05-12 DIAGNOSIS — G301 Alzheimer's disease with late onset: Secondary | ICD-10-CM | POA: Diagnosis not present

## 2019-05-13 DIAGNOSIS — J449 Chronic obstructive pulmonary disease, unspecified: Secondary | ICD-10-CM | POA: Diagnosis not present

## 2019-05-13 DIAGNOSIS — F0280 Dementia in other diseases classified elsewhere without behavioral disturbance: Secondary | ICD-10-CM | POA: Diagnosis not present

## 2019-05-13 DIAGNOSIS — I10 Essential (primary) hypertension: Secondary | ICD-10-CM | POA: Diagnosis not present

## 2019-05-13 DIAGNOSIS — E039 Hypothyroidism, unspecified: Secondary | ICD-10-CM | POA: Diagnosis not present

## 2019-05-13 DIAGNOSIS — G309 Alzheimer's disease, unspecified: Secondary | ICD-10-CM | POA: Diagnosis not present

## 2019-05-13 DIAGNOSIS — G319 Degenerative disease of nervous system, unspecified: Secondary | ICD-10-CM | POA: Diagnosis not present

## 2019-05-13 DIAGNOSIS — Z7982 Long term (current) use of aspirin: Secondary | ICD-10-CM | POA: Diagnosis not present

## 2019-05-13 DIAGNOSIS — F0150 Vascular dementia without behavioral disturbance: Secondary | ICD-10-CM | POA: Diagnosis not present

## 2019-05-13 DIAGNOSIS — G4733 Obstructive sleep apnea (adult) (pediatric): Secondary | ICD-10-CM | POA: Diagnosis not present

## 2019-05-14 DIAGNOSIS — Z1159 Encounter for screening for other viral diseases: Secondary | ICD-10-CM | POA: Diagnosis not present

## 2019-05-19 DIAGNOSIS — G301 Alzheimer's disease with late onset: Secondary | ICD-10-CM | POA: Diagnosis not present

## 2019-05-19 DIAGNOSIS — N390 Urinary tract infection, site not specified: Secondary | ICD-10-CM | POA: Diagnosis not present

## 2019-05-19 DIAGNOSIS — M1990 Unspecified osteoarthritis, unspecified site: Secondary | ICD-10-CM | POA: Diagnosis not present

## 2019-05-20 DIAGNOSIS — G4733 Obstructive sleep apnea (adult) (pediatric): Secondary | ICD-10-CM | POA: Diagnosis not present

## 2019-05-20 DIAGNOSIS — J449 Chronic obstructive pulmonary disease, unspecified: Secondary | ICD-10-CM | POA: Diagnosis not present

## 2019-05-20 DIAGNOSIS — G319 Degenerative disease of nervous system, unspecified: Secondary | ICD-10-CM | POA: Diagnosis not present

## 2019-05-20 DIAGNOSIS — I10 Essential (primary) hypertension: Secondary | ICD-10-CM | POA: Diagnosis not present

## 2019-05-20 DIAGNOSIS — E039 Hypothyroidism, unspecified: Secondary | ICD-10-CM | POA: Diagnosis not present

## 2019-05-20 DIAGNOSIS — F0280 Dementia in other diseases classified elsewhere without behavioral disturbance: Secondary | ICD-10-CM | POA: Diagnosis not present

## 2019-05-20 DIAGNOSIS — F0150 Vascular dementia without behavioral disturbance: Secondary | ICD-10-CM | POA: Diagnosis not present

## 2019-05-20 DIAGNOSIS — Z7982 Long term (current) use of aspirin: Secondary | ICD-10-CM | POA: Diagnosis not present

## 2019-05-20 DIAGNOSIS — G309 Alzheimer's disease, unspecified: Secondary | ICD-10-CM | POA: Diagnosis not present

## 2019-05-21 DIAGNOSIS — Z1159 Encounter for screening for other viral diseases: Secondary | ICD-10-CM | POA: Diagnosis not present

## 2019-05-26 DIAGNOSIS — G301 Alzheimer's disease with late onset: Secondary | ICD-10-CM | POA: Diagnosis not present

## 2019-05-26 DIAGNOSIS — I679 Cerebrovascular disease, unspecified: Secondary | ICD-10-CM | POA: Diagnosis not present

## 2019-05-26 DIAGNOSIS — M2550 Pain in unspecified joint: Secondary | ICD-10-CM | POA: Diagnosis not present

## 2019-05-26 DIAGNOSIS — J449 Chronic obstructive pulmonary disease, unspecified: Secondary | ICD-10-CM | POA: Diagnosis not present

## 2019-05-27 DIAGNOSIS — J449 Chronic obstructive pulmonary disease, unspecified: Secondary | ICD-10-CM | POA: Diagnosis not present

## 2019-05-27 DIAGNOSIS — G309 Alzheimer's disease, unspecified: Secondary | ICD-10-CM | POA: Diagnosis not present

## 2019-05-27 DIAGNOSIS — I10 Essential (primary) hypertension: Secondary | ICD-10-CM | POA: Diagnosis not present

## 2019-05-27 DIAGNOSIS — G4733 Obstructive sleep apnea (adult) (pediatric): Secondary | ICD-10-CM | POA: Diagnosis not present

## 2019-05-27 DIAGNOSIS — Z7982 Long term (current) use of aspirin: Secondary | ICD-10-CM | POA: Diagnosis not present

## 2019-05-27 DIAGNOSIS — F0150 Vascular dementia without behavioral disturbance: Secondary | ICD-10-CM | POA: Diagnosis not present

## 2019-05-27 DIAGNOSIS — E039 Hypothyroidism, unspecified: Secondary | ICD-10-CM | POA: Diagnosis not present

## 2019-05-27 DIAGNOSIS — G319 Degenerative disease of nervous system, unspecified: Secondary | ICD-10-CM | POA: Diagnosis not present

## 2019-05-27 DIAGNOSIS — F0280 Dementia in other diseases classified elsewhere without behavioral disturbance: Secondary | ICD-10-CM | POA: Diagnosis not present

## 2019-05-28 DIAGNOSIS — K580 Irritable bowel syndrome with diarrhea: Secondary | ICD-10-CM | POA: Diagnosis not present

## 2019-05-28 DIAGNOSIS — I10 Essential (primary) hypertension: Secondary | ICD-10-CM | POA: Diagnosis not present

## 2019-05-28 DIAGNOSIS — E119 Type 2 diabetes mellitus without complications: Secondary | ICD-10-CM | POA: Diagnosis not present

## 2019-05-29 DIAGNOSIS — Z1159 Encounter for screening for other viral diseases: Secondary | ICD-10-CM | POA: Diagnosis not present

## 2019-06-01 DIAGNOSIS — G319 Degenerative disease of nervous system, unspecified: Secondary | ICD-10-CM | POA: Diagnosis not present

## 2019-06-01 DIAGNOSIS — Z7982 Long term (current) use of aspirin: Secondary | ICD-10-CM | POA: Diagnosis not present

## 2019-06-01 DIAGNOSIS — G4733 Obstructive sleep apnea (adult) (pediatric): Secondary | ICD-10-CM | POA: Diagnosis not present

## 2019-06-01 DIAGNOSIS — F0150 Vascular dementia without behavioral disturbance: Secondary | ICD-10-CM | POA: Diagnosis not present

## 2019-06-01 DIAGNOSIS — E039 Hypothyroidism, unspecified: Secondary | ICD-10-CM | POA: Diagnosis not present

## 2019-06-01 DIAGNOSIS — J449 Chronic obstructive pulmonary disease, unspecified: Secondary | ICD-10-CM | POA: Diagnosis not present

## 2019-06-01 DIAGNOSIS — F0280 Dementia in other diseases classified elsewhere without behavioral disturbance: Secondary | ICD-10-CM | POA: Diagnosis not present

## 2019-06-01 DIAGNOSIS — I10 Essential (primary) hypertension: Secondary | ICD-10-CM | POA: Diagnosis not present

## 2019-06-01 DIAGNOSIS — G309 Alzheimer's disease, unspecified: Secondary | ICD-10-CM | POA: Diagnosis not present

## 2019-06-02 DIAGNOSIS — M1990 Unspecified osteoarthritis, unspecified site: Secondary | ICD-10-CM | POA: Diagnosis not present

## 2019-06-02 DIAGNOSIS — G301 Alzheimer's disease with late onset: Secondary | ICD-10-CM | POA: Diagnosis not present

## 2019-06-02 DIAGNOSIS — N390 Urinary tract infection, site not specified: Secondary | ICD-10-CM | POA: Diagnosis not present

## 2019-06-02 DIAGNOSIS — I10 Essential (primary) hypertension: Secondary | ICD-10-CM | POA: Diagnosis not present

## 2019-06-04 DIAGNOSIS — A4159 Other Gram-negative sepsis: Secondary | ICD-10-CM | POA: Diagnosis not present

## 2019-06-04 DIAGNOSIS — E119 Type 2 diabetes mellitus without complications: Secondary | ICD-10-CM | POA: Diagnosis not present

## 2019-06-05 DIAGNOSIS — Z1159 Encounter for screening for other viral diseases: Secondary | ICD-10-CM | POA: Diagnosis not present

## 2019-06-09 DIAGNOSIS — E039 Hypothyroidism, unspecified: Secondary | ICD-10-CM | POA: Diagnosis not present

## 2019-06-09 DIAGNOSIS — J449 Chronic obstructive pulmonary disease, unspecified: Secondary | ICD-10-CM | POA: Diagnosis not present

## 2019-06-09 DIAGNOSIS — G309 Alzheimer's disease, unspecified: Secondary | ICD-10-CM | POA: Diagnosis not present

## 2019-06-09 DIAGNOSIS — I10 Essential (primary) hypertension: Secondary | ICD-10-CM | POA: Diagnosis not present

## 2019-06-09 DIAGNOSIS — F0150 Vascular dementia without behavioral disturbance: Secondary | ICD-10-CM | POA: Diagnosis not present

## 2019-06-09 DIAGNOSIS — Z7982 Long term (current) use of aspirin: Secondary | ICD-10-CM | POA: Diagnosis not present

## 2019-06-09 DIAGNOSIS — G4733 Obstructive sleep apnea (adult) (pediatric): Secondary | ICD-10-CM | POA: Diagnosis not present

## 2019-06-09 DIAGNOSIS — F0280 Dementia in other diseases classified elsewhere without behavioral disturbance: Secondary | ICD-10-CM | POA: Diagnosis not present

## 2019-06-09 DIAGNOSIS — G319 Degenerative disease of nervous system, unspecified: Secondary | ICD-10-CM | POA: Diagnosis not present

## 2019-06-10 DIAGNOSIS — Z1159 Encounter for screening for other viral diseases: Secondary | ICD-10-CM | POA: Diagnosis not present

## 2019-06-12 DIAGNOSIS — M6281 Muscle weakness (generalized): Secondary | ICD-10-CM | POA: Diagnosis not present

## 2019-06-16 DIAGNOSIS — N390 Urinary tract infection, site not specified: Secondary | ICD-10-CM | POA: Diagnosis not present

## 2019-06-16 DIAGNOSIS — I10 Essential (primary) hypertension: Secondary | ICD-10-CM | POA: Diagnosis not present

## 2019-06-16 DIAGNOSIS — G301 Alzheimer's disease with late onset: Secondary | ICD-10-CM | POA: Diagnosis not present

## 2019-06-17 ENCOUNTER — Ambulatory Visit: Payer: Medicare HMO | Admitting: Neurology

## 2019-06-17 DIAGNOSIS — F0280 Dementia in other diseases classified elsewhere without behavioral disturbance: Secondary | ICD-10-CM | POA: Diagnosis not present

## 2019-06-17 DIAGNOSIS — G4733 Obstructive sleep apnea (adult) (pediatric): Secondary | ICD-10-CM | POA: Diagnosis not present

## 2019-06-17 DIAGNOSIS — I10 Essential (primary) hypertension: Secondary | ICD-10-CM | POA: Diagnosis not present

## 2019-06-17 DIAGNOSIS — E039 Hypothyroidism, unspecified: Secondary | ICD-10-CM | POA: Diagnosis not present

## 2019-06-17 DIAGNOSIS — G319 Degenerative disease of nervous system, unspecified: Secondary | ICD-10-CM | POA: Diagnosis not present

## 2019-06-17 DIAGNOSIS — J449 Chronic obstructive pulmonary disease, unspecified: Secondary | ICD-10-CM | POA: Diagnosis not present

## 2019-06-17 DIAGNOSIS — F0150 Vascular dementia without behavioral disturbance: Secondary | ICD-10-CM | POA: Diagnosis not present

## 2019-06-17 DIAGNOSIS — Z7982 Long term (current) use of aspirin: Secondary | ICD-10-CM | POA: Diagnosis not present

## 2019-06-17 DIAGNOSIS — G309 Alzheimer's disease, unspecified: Secondary | ICD-10-CM | POA: Diagnosis not present

## 2019-06-18 ENCOUNTER — Ambulatory Visit: Payer: Medicare HMO | Admitting: Neurology

## 2019-06-18 DIAGNOSIS — M25511 Pain in right shoulder: Secondary | ICD-10-CM | POA: Diagnosis not present

## 2019-06-18 DIAGNOSIS — M8580 Other specified disorders of bone density and structure, unspecified site: Secondary | ICD-10-CM | POA: Diagnosis not present

## 2019-06-18 DIAGNOSIS — M1711 Unilateral primary osteoarthritis, right knee: Secondary | ICD-10-CM | POA: Diagnosis not present

## 2019-06-18 DIAGNOSIS — M19071 Primary osteoarthritis, right ankle and foot: Secondary | ICD-10-CM | POA: Diagnosis not present

## 2019-07-02 DIAGNOSIS — Z20822 Contact with and (suspected) exposure to covid-19: Secondary | ICD-10-CM | POA: Diagnosis not present

## 2019-07-09 DIAGNOSIS — Z20822 Contact with and (suspected) exposure to covid-19: Secondary | ICD-10-CM | POA: Diagnosis not present

## 2019-07-12 DIAGNOSIS — M6281 Muscle weakness (generalized): Secondary | ICD-10-CM | POA: Diagnosis not present

## 2019-07-16 DIAGNOSIS — Z20822 Contact with and (suspected) exposure to covid-19: Secondary | ICD-10-CM | POA: Diagnosis not present

## 2019-07-22 DIAGNOSIS — F3289 Other specified depressive episodes: Secondary | ICD-10-CM | POA: Diagnosis not present

## 2019-07-22 DIAGNOSIS — F0391 Unspecified dementia with behavioral disturbance: Secondary | ICD-10-CM | POA: Diagnosis not present

## 2019-08-12 DIAGNOSIS — M6281 Muscle weakness (generalized): Secondary | ICD-10-CM | POA: Diagnosis not present

## 2019-08-24 DIAGNOSIS — I10 Essential (primary) hypertension: Secondary | ICD-10-CM | POA: Diagnosis not present

## 2019-08-24 DIAGNOSIS — M545 Low back pain: Secondary | ICD-10-CM | POA: Diagnosis not present

## 2019-08-24 DIAGNOSIS — M25511 Pain in right shoulder: Secondary | ICD-10-CM | POA: Diagnosis not present

## 2019-08-24 DIAGNOSIS — M25512 Pain in left shoulder: Secondary | ICD-10-CM | POA: Diagnosis not present

## 2019-08-24 DIAGNOSIS — M25562 Pain in left knee: Secondary | ICD-10-CM | POA: Diagnosis not present

## 2019-08-24 DIAGNOSIS — M25561 Pain in right knee: Secondary | ICD-10-CM | POA: Diagnosis not present

## 2019-08-24 DIAGNOSIS — F0150 Vascular dementia without behavioral disturbance: Secondary | ICD-10-CM | POA: Diagnosis not present

## 2019-08-24 DIAGNOSIS — F0280 Dementia in other diseases classified elsewhere without behavioral disturbance: Secondary | ICD-10-CM | POA: Diagnosis not present

## 2019-08-24 DIAGNOSIS — G309 Alzheimer's disease, unspecified: Secondary | ICD-10-CM | POA: Diagnosis not present

## 2019-08-31 DIAGNOSIS — G309 Alzheimer's disease, unspecified: Secondary | ICD-10-CM | POA: Diagnosis not present

## 2019-08-31 DIAGNOSIS — M545 Low back pain: Secondary | ICD-10-CM | POA: Diagnosis not present

## 2019-08-31 DIAGNOSIS — I10 Essential (primary) hypertension: Secondary | ICD-10-CM | POA: Diagnosis not present

## 2019-08-31 DIAGNOSIS — M25561 Pain in right knee: Secondary | ICD-10-CM | POA: Diagnosis not present

## 2019-08-31 DIAGNOSIS — M25562 Pain in left knee: Secondary | ICD-10-CM | POA: Diagnosis not present

## 2019-08-31 DIAGNOSIS — F0280 Dementia in other diseases classified elsewhere without behavioral disturbance: Secondary | ICD-10-CM | POA: Diagnosis not present

## 2019-08-31 DIAGNOSIS — M25511 Pain in right shoulder: Secondary | ICD-10-CM | POA: Diagnosis not present

## 2019-08-31 DIAGNOSIS — M25512 Pain in left shoulder: Secondary | ICD-10-CM | POA: Diagnosis not present

## 2019-08-31 DIAGNOSIS — F0150 Vascular dementia without behavioral disturbance: Secondary | ICD-10-CM | POA: Diagnosis not present

## 2019-09-01 DIAGNOSIS — I10 Essential (primary) hypertension: Secondary | ICD-10-CM | POA: Diagnosis not present

## 2019-09-01 DIAGNOSIS — I679 Cerebrovascular disease, unspecified: Secondary | ICD-10-CM | POA: Diagnosis not present

## 2019-09-01 DIAGNOSIS — G301 Alzheimer's disease with late onset: Secondary | ICD-10-CM | POA: Diagnosis not present

## 2019-09-02 DIAGNOSIS — M545 Low back pain: Secondary | ICD-10-CM | POA: Diagnosis not present

## 2019-09-02 DIAGNOSIS — F0280 Dementia in other diseases classified elsewhere without behavioral disturbance: Secondary | ICD-10-CM | POA: Diagnosis not present

## 2019-09-02 DIAGNOSIS — M25561 Pain in right knee: Secondary | ICD-10-CM | POA: Diagnosis not present

## 2019-09-02 DIAGNOSIS — I10 Essential (primary) hypertension: Secondary | ICD-10-CM | POA: Diagnosis not present

## 2019-09-02 DIAGNOSIS — M25512 Pain in left shoulder: Secondary | ICD-10-CM | POA: Diagnosis not present

## 2019-09-02 DIAGNOSIS — M25562 Pain in left knee: Secondary | ICD-10-CM | POA: Diagnosis not present

## 2019-09-02 DIAGNOSIS — G309 Alzheimer's disease, unspecified: Secondary | ICD-10-CM | POA: Diagnosis not present

## 2019-09-02 DIAGNOSIS — M25511 Pain in right shoulder: Secondary | ICD-10-CM | POA: Diagnosis not present

## 2019-09-02 DIAGNOSIS — F0150 Vascular dementia without behavioral disturbance: Secondary | ICD-10-CM | POA: Diagnosis not present

## 2019-09-03 DIAGNOSIS — M25561 Pain in right knee: Secondary | ICD-10-CM | POA: Diagnosis not present

## 2019-09-03 DIAGNOSIS — M545 Low back pain: Secondary | ICD-10-CM | POA: Diagnosis not present

## 2019-09-03 DIAGNOSIS — M25562 Pain in left knee: Secondary | ICD-10-CM | POA: Diagnosis not present

## 2019-09-03 DIAGNOSIS — F0280 Dementia in other diseases classified elsewhere without behavioral disturbance: Secondary | ICD-10-CM | POA: Diagnosis not present

## 2019-09-03 DIAGNOSIS — I10 Essential (primary) hypertension: Secondary | ICD-10-CM | POA: Diagnosis not present

## 2019-09-03 DIAGNOSIS — F0150 Vascular dementia without behavioral disturbance: Secondary | ICD-10-CM | POA: Diagnosis not present

## 2019-09-03 DIAGNOSIS — G309 Alzheimer's disease, unspecified: Secondary | ICD-10-CM | POA: Diagnosis not present

## 2019-09-03 DIAGNOSIS — M25511 Pain in right shoulder: Secondary | ICD-10-CM | POA: Diagnosis not present

## 2019-09-03 DIAGNOSIS — M25512 Pain in left shoulder: Secondary | ICD-10-CM | POA: Diagnosis not present

## 2019-09-04 DIAGNOSIS — M6281 Muscle weakness (generalized): Secondary | ICD-10-CM | POA: Diagnosis not present

## 2019-09-07 DIAGNOSIS — G309 Alzheimer's disease, unspecified: Secondary | ICD-10-CM | POA: Diagnosis not present

## 2019-09-07 DIAGNOSIS — F0150 Vascular dementia without behavioral disturbance: Secondary | ICD-10-CM | POA: Diagnosis not present

## 2019-09-07 DIAGNOSIS — M545 Low back pain: Secondary | ICD-10-CM | POA: Diagnosis not present

## 2019-09-07 DIAGNOSIS — F0280 Dementia in other diseases classified elsewhere without behavioral disturbance: Secondary | ICD-10-CM | POA: Diagnosis not present

## 2019-09-07 DIAGNOSIS — M25561 Pain in right knee: Secondary | ICD-10-CM | POA: Diagnosis not present

## 2019-09-07 DIAGNOSIS — M25512 Pain in left shoulder: Secondary | ICD-10-CM | POA: Diagnosis not present

## 2019-09-07 DIAGNOSIS — M25511 Pain in right shoulder: Secondary | ICD-10-CM | POA: Diagnosis not present

## 2019-09-07 DIAGNOSIS — I10 Essential (primary) hypertension: Secondary | ICD-10-CM | POA: Diagnosis not present

## 2019-09-07 DIAGNOSIS — M25562 Pain in left knee: Secondary | ICD-10-CM | POA: Diagnosis not present

## 2019-09-09 DIAGNOSIS — F0280 Dementia in other diseases classified elsewhere without behavioral disturbance: Secondary | ICD-10-CM | POA: Diagnosis not present

## 2019-09-09 DIAGNOSIS — M25512 Pain in left shoulder: Secondary | ICD-10-CM | POA: Diagnosis not present

## 2019-09-09 DIAGNOSIS — M25562 Pain in left knee: Secondary | ICD-10-CM | POA: Diagnosis not present

## 2019-09-09 DIAGNOSIS — F0150 Vascular dementia without behavioral disturbance: Secondary | ICD-10-CM | POA: Diagnosis not present

## 2019-09-09 DIAGNOSIS — M545 Low back pain: Secondary | ICD-10-CM | POA: Diagnosis not present

## 2019-09-09 DIAGNOSIS — G309 Alzheimer's disease, unspecified: Secondary | ICD-10-CM | POA: Diagnosis not present

## 2019-09-09 DIAGNOSIS — I10 Essential (primary) hypertension: Secondary | ICD-10-CM | POA: Diagnosis not present

## 2019-09-09 DIAGNOSIS — M25561 Pain in right knee: Secondary | ICD-10-CM | POA: Diagnosis not present

## 2019-09-09 DIAGNOSIS — M25511 Pain in right shoulder: Secondary | ICD-10-CM | POA: Diagnosis not present

## 2019-09-10 DIAGNOSIS — Z20822 Contact with and (suspected) exposure to covid-19: Secondary | ICD-10-CM | POA: Diagnosis not present

## 2019-09-12 DIAGNOSIS — M6281 Muscle weakness (generalized): Secondary | ICD-10-CM | POA: Diagnosis not present

## 2019-09-12 DIAGNOSIS — N390 Urinary tract infection, site not specified: Secondary | ICD-10-CM | POA: Diagnosis not present

## 2019-09-15 DIAGNOSIS — F0280 Dementia in other diseases classified elsewhere without behavioral disturbance: Secondary | ICD-10-CM | POA: Diagnosis not present

## 2019-09-15 DIAGNOSIS — F0150 Vascular dementia without behavioral disturbance: Secondary | ICD-10-CM | POA: Diagnosis not present

## 2019-09-15 DIAGNOSIS — M25562 Pain in left knee: Secondary | ICD-10-CM | POA: Diagnosis not present

## 2019-09-15 DIAGNOSIS — M2550 Pain in unspecified joint: Secondary | ICD-10-CM | POA: Diagnosis not present

## 2019-09-15 DIAGNOSIS — M25512 Pain in left shoulder: Secondary | ICD-10-CM | POA: Diagnosis not present

## 2019-09-15 DIAGNOSIS — Z20822 Contact with and (suspected) exposure to covid-19: Secondary | ICD-10-CM | POA: Diagnosis not present

## 2019-09-15 DIAGNOSIS — M25561 Pain in right knee: Secondary | ICD-10-CM | POA: Diagnosis not present

## 2019-09-15 DIAGNOSIS — M25511 Pain in right shoulder: Secondary | ICD-10-CM | POA: Diagnosis not present

## 2019-09-15 DIAGNOSIS — G309 Alzheimer's disease, unspecified: Secondary | ICD-10-CM | POA: Diagnosis not present

## 2019-09-15 DIAGNOSIS — M545 Low back pain: Secondary | ICD-10-CM | POA: Diagnosis not present

## 2019-09-15 DIAGNOSIS — R2681 Unsteadiness on feet: Secondary | ICD-10-CM | POA: Diagnosis not present

## 2019-09-15 DIAGNOSIS — M1990 Unspecified osteoarthritis, unspecified site: Secondary | ICD-10-CM | POA: Diagnosis not present

## 2019-09-15 DIAGNOSIS — I10 Essential (primary) hypertension: Secondary | ICD-10-CM | POA: Diagnosis not present

## 2019-09-15 DIAGNOSIS — G301 Alzheimer's disease with late onset: Secondary | ICD-10-CM | POA: Diagnosis not present

## 2019-09-16 DIAGNOSIS — M25562 Pain in left knee: Secondary | ICD-10-CM | POA: Diagnosis not present

## 2019-09-16 DIAGNOSIS — F0280 Dementia in other diseases classified elsewhere without behavioral disturbance: Secondary | ICD-10-CM | POA: Diagnosis not present

## 2019-09-16 DIAGNOSIS — M545 Low back pain: Secondary | ICD-10-CM | POA: Diagnosis not present

## 2019-09-16 DIAGNOSIS — M25561 Pain in right knee: Secondary | ICD-10-CM | POA: Diagnosis not present

## 2019-09-16 DIAGNOSIS — M25511 Pain in right shoulder: Secondary | ICD-10-CM | POA: Diagnosis not present

## 2019-09-16 DIAGNOSIS — I10 Essential (primary) hypertension: Secondary | ICD-10-CM | POA: Diagnosis not present

## 2019-09-16 DIAGNOSIS — M25512 Pain in left shoulder: Secondary | ICD-10-CM | POA: Diagnosis not present

## 2019-09-16 DIAGNOSIS — G309 Alzheimer's disease, unspecified: Secondary | ICD-10-CM | POA: Diagnosis not present

## 2019-09-16 DIAGNOSIS — F0150 Vascular dementia without behavioral disturbance: Secondary | ICD-10-CM | POA: Diagnosis not present

## 2019-09-17 DIAGNOSIS — M25562 Pain in left knee: Secondary | ICD-10-CM | POA: Diagnosis not present

## 2019-09-17 DIAGNOSIS — M545 Low back pain: Secondary | ICD-10-CM | POA: Diagnosis not present

## 2019-09-17 DIAGNOSIS — F0150 Vascular dementia without behavioral disturbance: Secondary | ICD-10-CM | POA: Diagnosis not present

## 2019-09-17 DIAGNOSIS — M25511 Pain in right shoulder: Secondary | ICD-10-CM | POA: Diagnosis not present

## 2019-09-17 DIAGNOSIS — M25512 Pain in left shoulder: Secondary | ICD-10-CM | POA: Diagnosis not present

## 2019-09-17 DIAGNOSIS — I10 Essential (primary) hypertension: Secondary | ICD-10-CM | POA: Diagnosis not present

## 2019-09-17 DIAGNOSIS — M25561 Pain in right knee: Secondary | ICD-10-CM | POA: Diagnosis not present

## 2019-09-17 DIAGNOSIS — E119 Type 2 diabetes mellitus without complications: Secondary | ICD-10-CM | POA: Diagnosis not present

## 2019-09-17 DIAGNOSIS — Z792 Long term (current) use of antibiotics: Secondary | ICD-10-CM | POA: Diagnosis not present

## 2019-09-17 DIAGNOSIS — F0280 Dementia in other diseases classified elsewhere without behavioral disturbance: Secondary | ICD-10-CM | POA: Diagnosis not present

## 2019-09-17 DIAGNOSIS — G309 Alzheimer's disease, unspecified: Secondary | ICD-10-CM | POA: Diagnosis not present

## 2019-09-17 DIAGNOSIS — Z20822 Contact with and (suspected) exposure to covid-19: Secondary | ICD-10-CM | POA: Diagnosis not present

## 2019-09-18 DIAGNOSIS — M25561 Pain in right knee: Secondary | ICD-10-CM | POA: Diagnosis not present

## 2019-09-18 DIAGNOSIS — F0280 Dementia in other diseases classified elsewhere without behavioral disturbance: Secondary | ICD-10-CM | POA: Diagnosis not present

## 2019-09-18 DIAGNOSIS — M25562 Pain in left knee: Secondary | ICD-10-CM | POA: Diagnosis not present

## 2019-09-18 DIAGNOSIS — G309 Alzheimer's disease, unspecified: Secondary | ICD-10-CM | POA: Diagnosis not present

## 2019-09-18 DIAGNOSIS — M545 Low back pain: Secondary | ICD-10-CM | POA: Diagnosis not present

## 2019-09-18 DIAGNOSIS — M25511 Pain in right shoulder: Secondary | ICD-10-CM | POA: Diagnosis not present

## 2019-09-18 DIAGNOSIS — I10 Essential (primary) hypertension: Secondary | ICD-10-CM | POA: Diagnosis not present

## 2019-09-18 DIAGNOSIS — M25512 Pain in left shoulder: Secondary | ICD-10-CM | POA: Diagnosis not present

## 2019-09-18 DIAGNOSIS — F0150 Vascular dementia without behavioral disturbance: Secondary | ICD-10-CM | POA: Diagnosis not present

## 2019-09-19 DIAGNOSIS — I10 Essential (primary) hypertension: Secondary | ICD-10-CM | POA: Diagnosis not present

## 2019-09-19 DIAGNOSIS — M25511 Pain in right shoulder: Secondary | ICD-10-CM | POA: Diagnosis not present

## 2019-09-19 DIAGNOSIS — M25562 Pain in left knee: Secondary | ICD-10-CM | POA: Diagnosis not present

## 2019-09-19 DIAGNOSIS — F0280 Dementia in other diseases classified elsewhere without behavioral disturbance: Secondary | ICD-10-CM | POA: Diagnosis not present

## 2019-09-19 DIAGNOSIS — M25512 Pain in left shoulder: Secondary | ICD-10-CM | POA: Diagnosis not present

## 2019-09-19 DIAGNOSIS — F0150 Vascular dementia without behavioral disturbance: Secondary | ICD-10-CM | POA: Diagnosis not present

## 2019-09-19 DIAGNOSIS — M25561 Pain in right knee: Secondary | ICD-10-CM | POA: Diagnosis not present

## 2019-09-19 DIAGNOSIS — G309 Alzheimer's disease, unspecified: Secondary | ICD-10-CM | POA: Diagnosis not present

## 2019-09-19 DIAGNOSIS — M545 Low back pain: Secondary | ICD-10-CM | POA: Diagnosis not present

## 2019-09-20 DIAGNOSIS — G309 Alzheimer's disease, unspecified: Secondary | ICD-10-CM | POA: Diagnosis not present

## 2019-09-20 DIAGNOSIS — M545 Low back pain: Secondary | ICD-10-CM | POA: Diagnosis not present

## 2019-09-20 DIAGNOSIS — M25561 Pain in right knee: Secondary | ICD-10-CM | POA: Diagnosis not present

## 2019-09-20 DIAGNOSIS — F0150 Vascular dementia without behavioral disturbance: Secondary | ICD-10-CM | POA: Diagnosis not present

## 2019-09-20 DIAGNOSIS — M25562 Pain in left knee: Secondary | ICD-10-CM | POA: Diagnosis not present

## 2019-09-20 DIAGNOSIS — M25511 Pain in right shoulder: Secondary | ICD-10-CM | POA: Diagnosis not present

## 2019-09-20 DIAGNOSIS — M25512 Pain in left shoulder: Secondary | ICD-10-CM | POA: Diagnosis not present

## 2019-09-20 DIAGNOSIS — I10 Essential (primary) hypertension: Secondary | ICD-10-CM | POA: Diagnosis not present

## 2019-09-20 DIAGNOSIS — F0280 Dementia in other diseases classified elsewhere without behavioral disturbance: Secondary | ICD-10-CM | POA: Diagnosis not present

## 2019-09-21 DIAGNOSIS — Z20822 Contact with and (suspected) exposure to covid-19: Secondary | ICD-10-CM | POA: Diagnosis not present

## 2019-09-21 DIAGNOSIS — G309 Alzheimer's disease, unspecified: Secondary | ICD-10-CM | POA: Diagnosis not present

## 2019-09-21 DIAGNOSIS — M25512 Pain in left shoulder: Secondary | ICD-10-CM | POA: Diagnosis not present

## 2019-09-21 DIAGNOSIS — M25511 Pain in right shoulder: Secondary | ICD-10-CM | POA: Diagnosis not present

## 2019-09-21 DIAGNOSIS — M25562 Pain in left knee: Secondary | ICD-10-CM | POA: Diagnosis not present

## 2019-09-21 DIAGNOSIS — M25561 Pain in right knee: Secondary | ICD-10-CM | POA: Diagnosis not present

## 2019-09-21 DIAGNOSIS — F0150 Vascular dementia without behavioral disturbance: Secondary | ICD-10-CM | POA: Diagnosis not present

## 2019-09-21 DIAGNOSIS — M545 Low back pain: Secondary | ICD-10-CM | POA: Diagnosis not present

## 2019-09-21 DIAGNOSIS — I10 Essential (primary) hypertension: Secondary | ICD-10-CM | POA: Diagnosis not present

## 2019-09-21 DIAGNOSIS — F0280 Dementia in other diseases classified elsewhere without behavioral disturbance: Secondary | ICD-10-CM | POA: Diagnosis not present

## 2019-09-23 DIAGNOSIS — M25512 Pain in left shoulder: Secondary | ICD-10-CM | POA: Diagnosis not present

## 2019-09-23 DIAGNOSIS — M25561 Pain in right knee: Secondary | ICD-10-CM | POA: Diagnosis not present

## 2019-09-23 DIAGNOSIS — M25511 Pain in right shoulder: Secondary | ICD-10-CM | POA: Diagnosis not present

## 2019-09-23 DIAGNOSIS — G309 Alzheimer's disease, unspecified: Secondary | ICD-10-CM | POA: Diagnosis not present

## 2019-09-23 DIAGNOSIS — M25562 Pain in left knee: Secondary | ICD-10-CM | POA: Diagnosis not present

## 2019-09-23 DIAGNOSIS — F0150 Vascular dementia without behavioral disturbance: Secondary | ICD-10-CM | POA: Diagnosis not present

## 2019-09-23 DIAGNOSIS — F0280 Dementia in other diseases classified elsewhere without behavioral disturbance: Secondary | ICD-10-CM | POA: Diagnosis not present

## 2019-09-23 DIAGNOSIS — I10 Essential (primary) hypertension: Secondary | ICD-10-CM | POA: Diagnosis not present

## 2019-09-23 DIAGNOSIS — M545 Low back pain, unspecified: Secondary | ICD-10-CM | POA: Diagnosis not present

## 2019-09-24 DIAGNOSIS — G309 Alzheimer's disease, unspecified: Secondary | ICD-10-CM | POA: Diagnosis not present

## 2019-09-24 DIAGNOSIS — M545 Low back pain, unspecified: Secondary | ICD-10-CM | POA: Diagnosis not present

## 2019-09-24 DIAGNOSIS — Z20822 Contact with and (suspected) exposure to covid-19: Secondary | ICD-10-CM | POA: Diagnosis not present

## 2019-09-24 DIAGNOSIS — M25512 Pain in left shoulder: Secondary | ICD-10-CM | POA: Diagnosis not present

## 2019-09-24 DIAGNOSIS — M25511 Pain in right shoulder: Secondary | ICD-10-CM | POA: Diagnosis not present

## 2019-09-24 DIAGNOSIS — I10 Essential (primary) hypertension: Secondary | ICD-10-CM | POA: Diagnosis not present

## 2019-09-24 DIAGNOSIS — F0150 Vascular dementia without behavioral disturbance: Secondary | ICD-10-CM | POA: Diagnosis not present

## 2019-09-24 DIAGNOSIS — F0280 Dementia in other diseases classified elsewhere without behavioral disturbance: Secondary | ICD-10-CM | POA: Diagnosis not present

## 2019-09-24 DIAGNOSIS — M25561 Pain in right knee: Secondary | ICD-10-CM | POA: Diagnosis not present

## 2019-09-24 DIAGNOSIS — M25562 Pain in left knee: Secondary | ICD-10-CM | POA: Diagnosis not present

## 2019-09-25 DIAGNOSIS — M25511 Pain in right shoulder: Secondary | ICD-10-CM | POA: Diagnosis not present

## 2019-09-25 DIAGNOSIS — I10 Essential (primary) hypertension: Secondary | ICD-10-CM | POA: Diagnosis not present

## 2019-09-25 DIAGNOSIS — M25562 Pain in left knee: Secondary | ICD-10-CM | POA: Diagnosis not present

## 2019-09-25 DIAGNOSIS — F0280 Dementia in other diseases classified elsewhere without behavioral disturbance: Secondary | ICD-10-CM | POA: Diagnosis not present

## 2019-09-25 DIAGNOSIS — G309 Alzheimer's disease, unspecified: Secondary | ICD-10-CM | POA: Diagnosis not present

## 2019-09-25 DIAGNOSIS — M25512 Pain in left shoulder: Secondary | ICD-10-CM | POA: Diagnosis not present

## 2019-09-25 DIAGNOSIS — M25561 Pain in right knee: Secondary | ICD-10-CM | POA: Diagnosis not present

## 2019-09-25 DIAGNOSIS — F0150 Vascular dementia without behavioral disturbance: Secondary | ICD-10-CM | POA: Diagnosis not present

## 2019-09-25 DIAGNOSIS — M545 Low back pain, unspecified: Secondary | ICD-10-CM | POA: Diagnosis not present

## 2019-09-28 DIAGNOSIS — M25512 Pain in left shoulder: Secondary | ICD-10-CM | POA: Diagnosis not present

## 2019-09-28 DIAGNOSIS — G309 Alzheimer's disease, unspecified: Secondary | ICD-10-CM | POA: Diagnosis not present

## 2019-09-28 DIAGNOSIS — F0150 Vascular dementia without behavioral disturbance: Secondary | ICD-10-CM | POA: Diagnosis not present

## 2019-09-28 DIAGNOSIS — M25562 Pain in left knee: Secondary | ICD-10-CM | POA: Diagnosis not present

## 2019-09-28 DIAGNOSIS — M545 Low back pain, unspecified: Secondary | ICD-10-CM | POA: Diagnosis not present

## 2019-09-28 DIAGNOSIS — I10 Essential (primary) hypertension: Secondary | ICD-10-CM | POA: Diagnosis not present

## 2019-09-28 DIAGNOSIS — Z20822 Contact with and (suspected) exposure to covid-19: Secondary | ICD-10-CM | POA: Diagnosis not present

## 2019-09-28 DIAGNOSIS — M25511 Pain in right shoulder: Secondary | ICD-10-CM | POA: Diagnosis not present

## 2019-09-28 DIAGNOSIS — F0280 Dementia in other diseases classified elsewhere without behavioral disturbance: Secondary | ICD-10-CM | POA: Diagnosis not present

## 2019-09-28 DIAGNOSIS — M25561 Pain in right knee: Secondary | ICD-10-CM | POA: Diagnosis not present

## 2019-09-30 DIAGNOSIS — M25562 Pain in left knee: Secondary | ICD-10-CM | POA: Diagnosis not present

## 2019-09-30 DIAGNOSIS — M25511 Pain in right shoulder: Secondary | ICD-10-CM | POA: Diagnosis not present

## 2019-09-30 DIAGNOSIS — M25561 Pain in right knee: Secondary | ICD-10-CM | POA: Diagnosis not present

## 2019-09-30 DIAGNOSIS — F0280 Dementia in other diseases classified elsewhere without behavioral disturbance: Secondary | ICD-10-CM | POA: Diagnosis not present

## 2019-09-30 DIAGNOSIS — I10 Essential (primary) hypertension: Secondary | ICD-10-CM | POA: Diagnosis not present

## 2019-09-30 DIAGNOSIS — M25512 Pain in left shoulder: Secondary | ICD-10-CM | POA: Diagnosis not present

## 2019-09-30 DIAGNOSIS — M545 Low back pain, unspecified: Secondary | ICD-10-CM | POA: Diagnosis not present

## 2019-09-30 DIAGNOSIS — G309 Alzheimer's disease, unspecified: Secondary | ICD-10-CM | POA: Diagnosis not present

## 2019-09-30 DIAGNOSIS — F0150 Vascular dementia without behavioral disturbance: Secondary | ICD-10-CM | POA: Diagnosis not present

## 2019-10-01 DIAGNOSIS — M25512 Pain in left shoulder: Secondary | ICD-10-CM | POA: Diagnosis not present

## 2019-10-01 DIAGNOSIS — M545 Low back pain, unspecified: Secondary | ICD-10-CM | POA: Diagnosis not present

## 2019-10-01 DIAGNOSIS — M25562 Pain in left knee: Secondary | ICD-10-CM | POA: Diagnosis not present

## 2019-10-01 DIAGNOSIS — F0280 Dementia in other diseases classified elsewhere without behavioral disturbance: Secondary | ICD-10-CM | POA: Diagnosis not present

## 2019-10-01 DIAGNOSIS — I10 Essential (primary) hypertension: Secondary | ICD-10-CM | POA: Diagnosis not present

## 2019-10-01 DIAGNOSIS — N390 Urinary tract infection, site not specified: Secondary | ICD-10-CM | POA: Diagnosis not present

## 2019-10-01 DIAGNOSIS — G309 Alzheimer's disease, unspecified: Secondary | ICD-10-CM | POA: Diagnosis not present

## 2019-10-01 DIAGNOSIS — Z09 Encounter for follow-up examination after completed treatment for conditions other than malignant neoplasm: Secondary | ICD-10-CM | POA: Diagnosis not present

## 2019-10-01 DIAGNOSIS — F0150 Vascular dementia without behavioral disturbance: Secondary | ICD-10-CM | POA: Diagnosis not present

## 2019-10-01 DIAGNOSIS — M25511 Pain in right shoulder: Secondary | ICD-10-CM | POA: Diagnosis not present

## 2019-10-01 DIAGNOSIS — M25561 Pain in right knee: Secondary | ICD-10-CM | POA: Diagnosis not present

## 2019-10-01 DIAGNOSIS — B9562 Methicillin resistant Staphylococcus aureus infection as the cause of diseases classified elsewhere: Secondary | ICD-10-CM | POA: Diagnosis not present

## 2019-10-02 DIAGNOSIS — I10 Essential (primary) hypertension: Secondary | ICD-10-CM | POA: Diagnosis not present

## 2019-10-02 DIAGNOSIS — M25561 Pain in right knee: Secondary | ICD-10-CM | POA: Diagnosis not present

## 2019-10-02 DIAGNOSIS — G309 Alzheimer's disease, unspecified: Secondary | ICD-10-CM | POA: Diagnosis not present

## 2019-10-02 DIAGNOSIS — M25511 Pain in right shoulder: Secondary | ICD-10-CM | POA: Diagnosis not present

## 2019-10-02 DIAGNOSIS — F0150 Vascular dementia without behavioral disturbance: Secondary | ICD-10-CM | POA: Diagnosis not present

## 2019-10-02 DIAGNOSIS — M25562 Pain in left knee: Secondary | ICD-10-CM | POA: Diagnosis not present

## 2019-10-02 DIAGNOSIS — F0280 Dementia in other diseases classified elsewhere without behavioral disturbance: Secondary | ICD-10-CM | POA: Diagnosis not present

## 2019-10-02 DIAGNOSIS — M545 Low back pain, unspecified: Secondary | ICD-10-CM | POA: Diagnosis not present

## 2019-10-02 DIAGNOSIS — M25512 Pain in left shoulder: Secondary | ICD-10-CM | POA: Diagnosis not present

## 2019-10-05 DIAGNOSIS — M545 Low back pain, unspecified: Secondary | ICD-10-CM | POA: Diagnosis not present

## 2019-10-05 DIAGNOSIS — I10 Essential (primary) hypertension: Secondary | ICD-10-CM | POA: Diagnosis not present

## 2019-10-05 DIAGNOSIS — M25512 Pain in left shoulder: Secondary | ICD-10-CM | POA: Diagnosis not present

## 2019-10-05 DIAGNOSIS — F0280 Dementia in other diseases classified elsewhere without behavioral disturbance: Secondary | ICD-10-CM | POA: Diagnosis not present

## 2019-10-05 DIAGNOSIS — G309 Alzheimer's disease, unspecified: Secondary | ICD-10-CM | POA: Diagnosis not present

## 2019-10-05 DIAGNOSIS — M25561 Pain in right knee: Secondary | ICD-10-CM | POA: Diagnosis not present

## 2019-10-05 DIAGNOSIS — M25511 Pain in right shoulder: Secondary | ICD-10-CM | POA: Diagnosis not present

## 2019-10-05 DIAGNOSIS — Z20822 Contact with and (suspected) exposure to covid-19: Secondary | ICD-10-CM | POA: Diagnosis not present

## 2019-10-05 DIAGNOSIS — F0150 Vascular dementia without behavioral disturbance: Secondary | ICD-10-CM | POA: Diagnosis not present

## 2019-10-05 DIAGNOSIS — M25562 Pain in left knee: Secondary | ICD-10-CM | POA: Diagnosis not present

## 2019-10-06 DIAGNOSIS — F0280 Dementia in other diseases classified elsewhere without behavioral disturbance: Secondary | ICD-10-CM | POA: Diagnosis not present

## 2019-10-06 DIAGNOSIS — M25512 Pain in left shoulder: Secondary | ICD-10-CM | POA: Diagnosis not present

## 2019-10-06 DIAGNOSIS — G309 Alzheimer's disease, unspecified: Secondary | ICD-10-CM | POA: Diagnosis not present

## 2019-10-06 DIAGNOSIS — M25561 Pain in right knee: Secondary | ICD-10-CM | POA: Diagnosis not present

## 2019-10-06 DIAGNOSIS — M545 Low back pain, unspecified: Secondary | ICD-10-CM | POA: Diagnosis not present

## 2019-10-06 DIAGNOSIS — F0150 Vascular dementia without behavioral disturbance: Secondary | ICD-10-CM | POA: Diagnosis not present

## 2019-10-06 DIAGNOSIS — M25511 Pain in right shoulder: Secondary | ICD-10-CM | POA: Diagnosis not present

## 2019-10-06 DIAGNOSIS — I10 Essential (primary) hypertension: Secondary | ICD-10-CM | POA: Diagnosis not present

## 2019-10-06 DIAGNOSIS — M25562 Pain in left knee: Secondary | ICD-10-CM | POA: Diagnosis not present

## 2019-10-07 DIAGNOSIS — M25562 Pain in left knee: Secondary | ICD-10-CM | POA: Diagnosis not present

## 2019-10-07 DIAGNOSIS — M25561 Pain in right knee: Secondary | ICD-10-CM | POA: Diagnosis not present

## 2019-10-07 DIAGNOSIS — M25511 Pain in right shoulder: Secondary | ICD-10-CM | POA: Diagnosis not present

## 2019-10-07 DIAGNOSIS — I10 Essential (primary) hypertension: Secondary | ICD-10-CM | POA: Diagnosis not present

## 2019-10-07 DIAGNOSIS — G309 Alzheimer's disease, unspecified: Secondary | ICD-10-CM | POA: Diagnosis not present

## 2019-10-07 DIAGNOSIS — M25512 Pain in left shoulder: Secondary | ICD-10-CM | POA: Diagnosis not present

## 2019-10-07 DIAGNOSIS — F0150 Vascular dementia without behavioral disturbance: Secondary | ICD-10-CM | POA: Diagnosis not present

## 2019-10-07 DIAGNOSIS — M545 Low back pain, unspecified: Secondary | ICD-10-CM | POA: Diagnosis not present

## 2019-10-07 DIAGNOSIS — F0280 Dementia in other diseases classified elsewhere without behavioral disturbance: Secondary | ICD-10-CM | POA: Diagnosis not present

## 2019-10-09 DIAGNOSIS — M545 Low back pain, unspecified: Secondary | ICD-10-CM | POA: Diagnosis not present

## 2019-10-09 DIAGNOSIS — M25512 Pain in left shoulder: Secondary | ICD-10-CM | POA: Diagnosis not present

## 2019-10-09 DIAGNOSIS — I10 Essential (primary) hypertension: Secondary | ICD-10-CM | POA: Diagnosis not present

## 2019-10-09 DIAGNOSIS — M25511 Pain in right shoulder: Secondary | ICD-10-CM | POA: Diagnosis not present

## 2019-10-09 DIAGNOSIS — F0280 Dementia in other diseases classified elsewhere without behavioral disturbance: Secondary | ICD-10-CM | POA: Diagnosis not present

## 2019-10-09 DIAGNOSIS — F0150 Vascular dementia without behavioral disturbance: Secondary | ICD-10-CM | POA: Diagnosis not present

## 2019-10-09 DIAGNOSIS — M25562 Pain in left knee: Secondary | ICD-10-CM | POA: Diagnosis not present

## 2019-10-09 DIAGNOSIS — G309 Alzheimer's disease, unspecified: Secondary | ICD-10-CM | POA: Diagnosis not present

## 2019-10-09 DIAGNOSIS — M25561 Pain in right knee: Secondary | ICD-10-CM | POA: Diagnosis not present

## 2019-10-12 DIAGNOSIS — F0280 Dementia in other diseases classified elsewhere without behavioral disturbance: Secondary | ICD-10-CM | POA: Diagnosis not present

## 2019-10-12 DIAGNOSIS — Z20822 Contact with and (suspected) exposure to covid-19: Secondary | ICD-10-CM | POA: Diagnosis not present

## 2019-10-12 DIAGNOSIS — I10 Essential (primary) hypertension: Secondary | ICD-10-CM | POA: Diagnosis not present

## 2019-10-12 DIAGNOSIS — G309 Alzheimer's disease, unspecified: Secondary | ICD-10-CM | POA: Diagnosis not present

## 2019-10-12 DIAGNOSIS — M25562 Pain in left knee: Secondary | ICD-10-CM | POA: Diagnosis not present

## 2019-10-12 DIAGNOSIS — F0150 Vascular dementia without behavioral disturbance: Secondary | ICD-10-CM | POA: Diagnosis not present

## 2019-10-12 DIAGNOSIS — M25512 Pain in left shoulder: Secondary | ICD-10-CM | POA: Diagnosis not present

## 2019-10-12 DIAGNOSIS — M6281 Muscle weakness (generalized): Secondary | ICD-10-CM | POA: Diagnosis not present

## 2019-10-12 DIAGNOSIS — M25511 Pain in right shoulder: Secondary | ICD-10-CM | POA: Diagnosis not present

## 2019-10-12 DIAGNOSIS — M25561 Pain in right knee: Secondary | ICD-10-CM | POA: Diagnosis not present

## 2019-10-12 DIAGNOSIS — M545 Low back pain, unspecified: Secondary | ICD-10-CM | POA: Diagnosis not present

## 2019-10-13 DIAGNOSIS — G309 Alzheimer's disease, unspecified: Secondary | ICD-10-CM | POA: Diagnosis not present

## 2019-10-13 DIAGNOSIS — J449 Chronic obstructive pulmonary disease, unspecified: Secondary | ICD-10-CM | POA: Diagnosis not present

## 2019-10-13 DIAGNOSIS — F0280 Dementia in other diseases classified elsewhere without behavioral disturbance: Secondary | ICD-10-CM | POA: Diagnosis not present

## 2019-10-13 DIAGNOSIS — M25562 Pain in left knee: Secondary | ICD-10-CM | POA: Diagnosis not present

## 2019-10-13 DIAGNOSIS — M25512 Pain in left shoulder: Secondary | ICD-10-CM | POA: Diagnosis not present

## 2019-10-13 DIAGNOSIS — M25511 Pain in right shoulder: Secondary | ICD-10-CM | POA: Diagnosis not present

## 2019-10-13 DIAGNOSIS — G301 Alzheimer's disease with late onset: Secondary | ICD-10-CM | POA: Diagnosis not present

## 2019-10-13 DIAGNOSIS — N390 Urinary tract infection, site not specified: Secondary | ICD-10-CM | POA: Diagnosis not present

## 2019-10-13 DIAGNOSIS — I10 Essential (primary) hypertension: Secondary | ICD-10-CM | POA: Diagnosis not present

## 2019-10-13 DIAGNOSIS — M545 Low back pain, unspecified: Secondary | ICD-10-CM | POA: Diagnosis not present

## 2019-10-13 DIAGNOSIS — M25561 Pain in right knee: Secondary | ICD-10-CM | POA: Diagnosis not present

## 2019-10-13 DIAGNOSIS — F0150 Vascular dementia without behavioral disturbance: Secondary | ICD-10-CM | POA: Diagnosis not present

## 2019-10-15 DIAGNOSIS — F0150 Vascular dementia without behavioral disturbance: Secondary | ICD-10-CM | POA: Diagnosis not present

## 2019-10-15 DIAGNOSIS — G309 Alzheimer's disease, unspecified: Secondary | ICD-10-CM | POA: Diagnosis not present

## 2019-10-15 DIAGNOSIS — M25512 Pain in left shoulder: Secondary | ICD-10-CM | POA: Diagnosis not present

## 2019-10-15 DIAGNOSIS — F0280 Dementia in other diseases classified elsewhere without behavioral disturbance: Secondary | ICD-10-CM | POA: Diagnosis not present

## 2019-10-15 DIAGNOSIS — M25511 Pain in right shoulder: Secondary | ICD-10-CM | POA: Diagnosis not present

## 2019-10-15 DIAGNOSIS — M25561 Pain in right knee: Secondary | ICD-10-CM | POA: Diagnosis not present

## 2019-10-15 DIAGNOSIS — I10 Essential (primary) hypertension: Secondary | ICD-10-CM | POA: Diagnosis not present

## 2019-10-15 DIAGNOSIS — M545 Low back pain, unspecified: Secondary | ICD-10-CM | POA: Diagnosis not present

## 2019-10-15 DIAGNOSIS — M25562 Pain in left knee: Secondary | ICD-10-CM | POA: Diagnosis not present

## 2019-10-19 DIAGNOSIS — Z20822 Contact with and (suspected) exposure to covid-19: Secondary | ICD-10-CM | POA: Diagnosis not present

## 2019-10-20 DIAGNOSIS — F0150 Vascular dementia without behavioral disturbance: Secondary | ICD-10-CM | POA: Diagnosis not present

## 2019-10-20 DIAGNOSIS — I10 Essential (primary) hypertension: Secondary | ICD-10-CM | POA: Diagnosis not present

## 2019-10-20 DIAGNOSIS — M545 Low back pain, unspecified: Secondary | ICD-10-CM | POA: Diagnosis not present

## 2019-10-20 DIAGNOSIS — M25512 Pain in left shoulder: Secondary | ICD-10-CM | POA: Diagnosis not present

## 2019-10-20 DIAGNOSIS — F0280 Dementia in other diseases classified elsewhere without behavioral disturbance: Secondary | ICD-10-CM | POA: Diagnosis not present

## 2019-10-20 DIAGNOSIS — G309 Alzheimer's disease, unspecified: Secondary | ICD-10-CM | POA: Diagnosis not present

## 2019-10-20 DIAGNOSIS — M25562 Pain in left knee: Secondary | ICD-10-CM | POA: Diagnosis not present

## 2019-10-20 DIAGNOSIS — M25511 Pain in right shoulder: Secondary | ICD-10-CM | POA: Diagnosis not present

## 2019-10-20 DIAGNOSIS — M25561 Pain in right knee: Secondary | ICD-10-CM | POA: Diagnosis not present

## 2019-10-22 DIAGNOSIS — E119 Type 2 diabetes mellitus without complications: Secondary | ICD-10-CM | POA: Diagnosis not present

## 2019-10-22 DIAGNOSIS — E559 Vitamin D deficiency, unspecified: Secondary | ICD-10-CM | POA: Diagnosis not present

## 2019-10-26 DIAGNOSIS — Z20822 Contact with and (suspected) exposure to covid-19: Secondary | ICD-10-CM | POA: Diagnosis not present

## 2019-10-29 DIAGNOSIS — I10 Essential (primary) hypertension: Secondary | ICD-10-CM | POA: Diagnosis not present

## 2019-10-29 DIAGNOSIS — J449 Chronic obstructive pulmonary disease, unspecified: Secondary | ICD-10-CM | POA: Diagnosis not present

## 2019-10-29 DIAGNOSIS — G8929 Other chronic pain: Secondary | ICD-10-CM | POA: Diagnosis not present

## 2019-10-29 DIAGNOSIS — F0151 Vascular dementia with behavioral disturbance: Secondary | ICD-10-CM | POA: Diagnosis not present

## 2019-10-29 DIAGNOSIS — M19042 Primary osteoarthritis, left hand: Secondary | ICD-10-CM | POA: Diagnosis not present

## 2019-10-29 DIAGNOSIS — G301 Alzheimer's disease with late onset: Secondary | ICD-10-CM | POA: Diagnosis not present

## 2019-10-29 DIAGNOSIS — M19041 Primary osteoarthritis, right hand: Secondary | ICD-10-CM | POA: Diagnosis not present

## 2019-10-29 DIAGNOSIS — E039 Hypothyroidism, unspecified: Secondary | ICD-10-CM | POA: Diagnosis not present

## 2019-10-29 DIAGNOSIS — I679 Cerebrovascular disease, unspecified: Secondary | ICD-10-CM | POA: Diagnosis not present

## 2019-10-29 DIAGNOSIS — F0280 Dementia in other diseases classified elsewhere without behavioral disturbance: Secondary | ICD-10-CM | POA: Diagnosis not present

## 2019-10-29 DIAGNOSIS — M1710 Unilateral primary osteoarthritis, unspecified knee: Secondary | ICD-10-CM | POA: Diagnosis not present

## 2019-10-29 DIAGNOSIS — M069 Rheumatoid arthritis, unspecified: Secondary | ICD-10-CM | POA: Diagnosis not present

## 2019-11-02 DIAGNOSIS — I10 Essential (primary) hypertension: Secondary | ICD-10-CM | POA: Diagnosis not present

## 2019-11-02 DIAGNOSIS — J449 Chronic obstructive pulmonary disease, unspecified: Secondary | ICD-10-CM | POA: Diagnosis not present

## 2019-11-02 DIAGNOSIS — M1710 Unilateral primary osteoarthritis, unspecified knee: Secondary | ICD-10-CM | POA: Diagnosis not present

## 2019-11-02 DIAGNOSIS — M19041 Primary osteoarthritis, right hand: Secondary | ICD-10-CM | POA: Diagnosis not present

## 2019-11-02 DIAGNOSIS — G8929 Other chronic pain: Secondary | ICD-10-CM | POA: Diagnosis not present

## 2019-11-02 DIAGNOSIS — M069 Rheumatoid arthritis, unspecified: Secondary | ICD-10-CM | POA: Diagnosis not present

## 2019-11-02 DIAGNOSIS — F0280 Dementia in other diseases classified elsewhere without behavioral disturbance: Secondary | ICD-10-CM | POA: Diagnosis not present

## 2019-11-02 DIAGNOSIS — M19042 Primary osteoarthritis, left hand: Secondary | ICD-10-CM | POA: Diagnosis not present

## 2019-11-02 DIAGNOSIS — Z20822 Contact with and (suspected) exposure to covid-19: Secondary | ICD-10-CM | POA: Diagnosis not present

## 2019-11-02 DIAGNOSIS — G301 Alzheimer's disease with late onset: Secondary | ICD-10-CM | POA: Diagnosis not present

## 2019-11-03 DIAGNOSIS — M069 Rheumatoid arthritis, unspecified: Secondary | ICD-10-CM | POA: Diagnosis not present

## 2019-11-03 DIAGNOSIS — M1990 Unspecified osteoarthritis, unspecified site: Secondary | ICD-10-CM | POA: Diagnosis not present

## 2019-11-03 DIAGNOSIS — R2681 Unsteadiness on feet: Secondary | ICD-10-CM | POA: Diagnosis not present

## 2019-11-03 DIAGNOSIS — I679 Cerebrovascular disease, unspecified: Secondary | ICD-10-CM | POA: Diagnosis not present

## 2019-11-03 DIAGNOSIS — G301 Alzheimer's disease with late onset: Secondary | ICD-10-CM | POA: Diagnosis not present

## 2019-11-05 DIAGNOSIS — G301 Alzheimer's disease with late onset: Secondary | ICD-10-CM | POA: Diagnosis not present

## 2019-11-05 DIAGNOSIS — G8929 Other chronic pain: Secondary | ICD-10-CM | POA: Diagnosis not present

## 2019-11-05 DIAGNOSIS — M19042 Primary osteoarthritis, left hand: Secondary | ICD-10-CM | POA: Diagnosis not present

## 2019-11-05 DIAGNOSIS — J449 Chronic obstructive pulmonary disease, unspecified: Secondary | ICD-10-CM | POA: Diagnosis not present

## 2019-11-05 DIAGNOSIS — M069 Rheumatoid arthritis, unspecified: Secondary | ICD-10-CM | POA: Diagnosis not present

## 2019-11-05 DIAGNOSIS — M19041 Primary osteoarthritis, right hand: Secondary | ICD-10-CM | POA: Diagnosis not present

## 2019-11-05 DIAGNOSIS — I10 Essential (primary) hypertension: Secondary | ICD-10-CM | POA: Diagnosis not present

## 2019-11-05 DIAGNOSIS — M1710 Unilateral primary osteoarthritis, unspecified knee: Secondary | ICD-10-CM | POA: Diagnosis not present

## 2019-11-05 DIAGNOSIS — F0280 Dementia in other diseases classified elsewhere without behavioral disturbance: Secondary | ICD-10-CM | POA: Diagnosis not present

## 2019-11-09 DIAGNOSIS — Z20822 Contact with and (suspected) exposure to covid-19: Secondary | ICD-10-CM | POA: Diagnosis not present

## 2019-11-11 DIAGNOSIS — J449 Chronic obstructive pulmonary disease, unspecified: Secondary | ICD-10-CM | POA: Diagnosis not present

## 2019-11-11 DIAGNOSIS — M19041 Primary osteoarthritis, right hand: Secondary | ICD-10-CM | POA: Diagnosis not present

## 2019-11-11 DIAGNOSIS — M1710 Unilateral primary osteoarthritis, unspecified knee: Secondary | ICD-10-CM | POA: Diagnosis not present

## 2019-11-11 DIAGNOSIS — G8929 Other chronic pain: Secondary | ICD-10-CM | POA: Diagnosis not present

## 2019-11-11 DIAGNOSIS — I10 Essential (primary) hypertension: Secondary | ICD-10-CM | POA: Diagnosis not present

## 2019-11-11 DIAGNOSIS — M19042 Primary osteoarthritis, left hand: Secondary | ICD-10-CM | POA: Diagnosis not present

## 2019-11-11 DIAGNOSIS — G301 Alzheimer's disease with late onset: Secondary | ICD-10-CM | POA: Diagnosis not present

## 2019-11-11 DIAGNOSIS — M069 Rheumatoid arthritis, unspecified: Secondary | ICD-10-CM | POA: Diagnosis not present

## 2019-11-11 DIAGNOSIS — F0280 Dementia in other diseases classified elsewhere without behavioral disturbance: Secondary | ICD-10-CM | POA: Diagnosis not present

## 2019-11-12 DIAGNOSIS — M6281 Muscle weakness (generalized): Secondary | ICD-10-CM | POA: Diagnosis not present

## 2019-11-13 DIAGNOSIS — M19042 Primary osteoarthritis, left hand: Secondary | ICD-10-CM | POA: Diagnosis not present

## 2019-11-13 DIAGNOSIS — M19041 Primary osteoarthritis, right hand: Secondary | ICD-10-CM | POA: Diagnosis not present

## 2019-11-13 DIAGNOSIS — J449 Chronic obstructive pulmonary disease, unspecified: Secondary | ICD-10-CM | POA: Diagnosis not present

## 2019-11-13 DIAGNOSIS — M069 Rheumatoid arthritis, unspecified: Secondary | ICD-10-CM | POA: Diagnosis not present

## 2019-11-13 DIAGNOSIS — M1710 Unilateral primary osteoarthritis, unspecified knee: Secondary | ICD-10-CM | POA: Diagnosis not present

## 2019-11-13 DIAGNOSIS — F0280 Dementia in other diseases classified elsewhere without behavioral disturbance: Secondary | ICD-10-CM | POA: Diagnosis not present

## 2019-11-13 DIAGNOSIS — G301 Alzheimer's disease with late onset: Secondary | ICD-10-CM | POA: Diagnosis not present

## 2019-11-13 DIAGNOSIS — G8929 Other chronic pain: Secondary | ICD-10-CM | POA: Diagnosis not present

## 2019-11-13 DIAGNOSIS — I10 Essential (primary) hypertension: Secondary | ICD-10-CM | POA: Diagnosis not present

## 2019-11-16 DIAGNOSIS — Z20822 Contact with and (suspected) exposure to covid-19: Secondary | ICD-10-CM | POA: Diagnosis not present

## 2019-11-17 DIAGNOSIS — R918 Other nonspecific abnormal finding of lung field: Secondary | ICD-10-CM | POA: Diagnosis not present

## 2019-11-18 DIAGNOSIS — M1710 Unilateral primary osteoarthritis, unspecified knee: Secondary | ICD-10-CM | POA: Diagnosis not present

## 2019-11-18 DIAGNOSIS — F0280 Dementia in other diseases classified elsewhere without behavioral disturbance: Secondary | ICD-10-CM | POA: Diagnosis not present

## 2019-11-18 DIAGNOSIS — G8929 Other chronic pain: Secondary | ICD-10-CM | POA: Diagnosis not present

## 2019-11-18 DIAGNOSIS — J449 Chronic obstructive pulmonary disease, unspecified: Secondary | ICD-10-CM | POA: Diagnosis not present

## 2019-11-18 DIAGNOSIS — M069 Rheumatoid arthritis, unspecified: Secondary | ICD-10-CM | POA: Diagnosis not present

## 2019-11-18 DIAGNOSIS — I10 Essential (primary) hypertension: Secondary | ICD-10-CM | POA: Diagnosis not present

## 2019-11-18 DIAGNOSIS — M19041 Primary osteoarthritis, right hand: Secondary | ICD-10-CM | POA: Diagnosis not present

## 2019-11-18 DIAGNOSIS — G301 Alzheimer's disease with late onset: Secondary | ICD-10-CM | POA: Diagnosis not present

## 2019-11-18 DIAGNOSIS — M19042 Primary osteoarthritis, left hand: Secondary | ICD-10-CM | POA: Diagnosis not present

## 2019-11-23 DIAGNOSIS — M1710 Unilateral primary osteoarthritis, unspecified knee: Secondary | ICD-10-CM | POA: Diagnosis not present

## 2019-11-23 DIAGNOSIS — M19041 Primary osteoarthritis, right hand: Secondary | ICD-10-CM | POA: Diagnosis not present

## 2019-11-23 DIAGNOSIS — Z20822 Contact with and (suspected) exposure to covid-19: Secondary | ICD-10-CM | POA: Diagnosis not present

## 2019-11-23 DIAGNOSIS — G8929 Other chronic pain: Secondary | ICD-10-CM | POA: Diagnosis not present

## 2019-11-23 DIAGNOSIS — I10 Essential (primary) hypertension: Secondary | ICD-10-CM | POA: Diagnosis not present

## 2019-11-23 DIAGNOSIS — J449 Chronic obstructive pulmonary disease, unspecified: Secondary | ICD-10-CM | POA: Diagnosis not present

## 2019-11-23 DIAGNOSIS — G301 Alzheimer's disease with late onset: Secondary | ICD-10-CM | POA: Diagnosis not present

## 2019-11-23 DIAGNOSIS — M19042 Primary osteoarthritis, left hand: Secondary | ICD-10-CM | POA: Diagnosis not present

## 2019-11-23 DIAGNOSIS — M069 Rheumatoid arthritis, unspecified: Secondary | ICD-10-CM | POA: Diagnosis not present

## 2019-11-23 DIAGNOSIS — F0280 Dementia in other diseases classified elsewhere without behavioral disturbance: Secondary | ICD-10-CM | POA: Diagnosis not present

## 2019-11-24 DIAGNOSIS — J189 Pneumonia, unspecified organism: Secondary | ICD-10-CM | POA: Diagnosis not present

## 2019-11-24 DIAGNOSIS — I10 Essential (primary) hypertension: Secondary | ICD-10-CM | POA: Diagnosis not present

## 2019-11-24 DIAGNOSIS — J449 Chronic obstructive pulmonary disease, unspecified: Secondary | ICD-10-CM | POA: Diagnosis not present

## 2019-11-27 DIAGNOSIS — J449 Chronic obstructive pulmonary disease, unspecified: Secondary | ICD-10-CM | POA: Diagnosis not present

## 2019-11-27 DIAGNOSIS — M19041 Primary osteoarthritis, right hand: Secondary | ICD-10-CM | POA: Diagnosis not present

## 2019-11-27 DIAGNOSIS — M069 Rheumatoid arthritis, unspecified: Secondary | ICD-10-CM | POA: Diagnosis not present

## 2019-11-27 DIAGNOSIS — M19042 Primary osteoarthritis, left hand: Secondary | ICD-10-CM | POA: Diagnosis not present

## 2019-11-27 DIAGNOSIS — F0280 Dementia in other diseases classified elsewhere without behavioral disturbance: Secondary | ICD-10-CM | POA: Diagnosis not present

## 2019-11-27 DIAGNOSIS — G301 Alzheimer's disease with late onset: Secondary | ICD-10-CM | POA: Diagnosis not present

## 2019-11-27 DIAGNOSIS — M1710 Unilateral primary osteoarthritis, unspecified knee: Secondary | ICD-10-CM | POA: Diagnosis not present

## 2019-11-27 DIAGNOSIS — I10 Essential (primary) hypertension: Secondary | ICD-10-CM | POA: Diagnosis not present

## 2019-11-27 DIAGNOSIS — G8929 Other chronic pain: Secondary | ICD-10-CM | POA: Diagnosis not present

## 2019-11-27 DIAGNOSIS — F3289 Other specified depressive episodes: Secondary | ICD-10-CM | POA: Diagnosis not present

## 2019-11-27 DIAGNOSIS — F0390 Unspecified dementia without behavioral disturbance: Secondary | ICD-10-CM | POA: Diagnosis not present

## 2019-11-28 DIAGNOSIS — M19042 Primary osteoarthritis, left hand: Secondary | ICD-10-CM | POA: Diagnosis not present

## 2019-11-28 DIAGNOSIS — F0280 Dementia in other diseases classified elsewhere without behavioral disturbance: Secondary | ICD-10-CM | POA: Diagnosis not present

## 2019-11-28 DIAGNOSIS — I10 Essential (primary) hypertension: Secondary | ICD-10-CM | POA: Diagnosis not present

## 2019-11-28 DIAGNOSIS — M069 Rheumatoid arthritis, unspecified: Secondary | ICD-10-CM | POA: Diagnosis not present

## 2019-11-28 DIAGNOSIS — M19041 Primary osteoarthritis, right hand: Secondary | ICD-10-CM | POA: Diagnosis not present

## 2019-11-28 DIAGNOSIS — G301 Alzheimer's disease with late onset: Secondary | ICD-10-CM | POA: Diagnosis not present

## 2019-11-28 DIAGNOSIS — M1710 Unilateral primary osteoarthritis, unspecified knee: Secondary | ICD-10-CM | POA: Diagnosis not present

## 2019-11-28 DIAGNOSIS — G8929 Other chronic pain: Secondary | ICD-10-CM | POA: Diagnosis not present

## 2019-11-28 DIAGNOSIS — J449 Chronic obstructive pulmonary disease, unspecified: Secondary | ICD-10-CM | POA: Diagnosis not present

## 2019-11-30 DIAGNOSIS — M19042 Primary osteoarthritis, left hand: Secondary | ICD-10-CM | POA: Diagnosis not present

## 2019-11-30 DIAGNOSIS — M19041 Primary osteoarthritis, right hand: Secondary | ICD-10-CM | POA: Diagnosis not present

## 2019-11-30 DIAGNOSIS — J449 Chronic obstructive pulmonary disease, unspecified: Secondary | ICD-10-CM | POA: Diagnosis not present

## 2019-11-30 DIAGNOSIS — I10 Essential (primary) hypertension: Secondary | ICD-10-CM | POA: Diagnosis not present

## 2019-11-30 DIAGNOSIS — F0280 Dementia in other diseases classified elsewhere without behavioral disturbance: Secondary | ICD-10-CM | POA: Diagnosis not present

## 2019-11-30 DIAGNOSIS — G8929 Other chronic pain: Secondary | ICD-10-CM | POA: Diagnosis not present

## 2019-11-30 DIAGNOSIS — M1710 Unilateral primary osteoarthritis, unspecified knee: Secondary | ICD-10-CM | POA: Diagnosis not present

## 2019-11-30 DIAGNOSIS — M069 Rheumatoid arthritis, unspecified: Secondary | ICD-10-CM | POA: Diagnosis not present

## 2019-11-30 DIAGNOSIS — G301 Alzheimer's disease with late onset: Secondary | ICD-10-CM | POA: Diagnosis not present

## 2019-12-02 DIAGNOSIS — J449 Chronic obstructive pulmonary disease, unspecified: Secondary | ICD-10-CM | POA: Diagnosis not present

## 2019-12-02 DIAGNOSIS — M069 Rheumatoid arthritis, unspecified: Secondary | ICD-10-CM | POA: Diagnosis not present

## 2019-12-02 DIAGNOSIS — I10 Essential (primary) hypertension: Secondary | ICD-10-CM | POA: Diagnosis not present

## 2019-12-02 DIAGNOSIS — M19041 Primary osteoarthritis, right hand: Secondary | ICD-10-CM | POA: Diagnosis not present

## 2019-12-02 DIAGNOSIS — M19042 Primary osteoarthritis, left hand: Secondary | ICD-10-CM | POA: Diagnosis not present

## 2019-12-02 DIAGNOSIS — G301 Alzheimer's disease with late onset: Secondary | ICD-10-CM | POA: Diagnosis not present

## 2019-12-02 DIAGNOSIS — G8929 Other chronic pain: Secondary | ICD-10-CM | POA: Diagnosis not present

## 2019-12-02 DIAGNOSIS — F0280 Dementia in other diseases classified elsewhere without behavioral disturbance: Secondary | ICD-10-CM | POA: Diagnosis not present

## 2019-12-02 DIAGNOSIS — M1710 Unilateral primary osteoarthritis, unspecified knee: Secondary | ICD-10-CM | POA: Diagnosis not present

## 2019-12-07 DIAGNOSIS — M069 Rheumatoid arthritis, unspecified: Secondary | ICD-10-CM | POA: Diagnosis not present

## 2019-12-07 DIAGNOSIS — M19042 Primary osteoarthritis, left hand: Secondary | ICD-10-CM | POA: Diagnosis not present

## 2019-12-07 DIAGNOSIS — G8929 Other chronic pain: Secondary | ICD-10-CM | POA: Diagnosis not present

## 2019-12-07 DIAGNOSIS — J449 Chronic obstructive pulmonary disease, unspecified: Secondary | ICD-10-CM | POA: Diagnosis not present

## 2019-12-07 DIAGNOSIS — G301 Alzheimer's disease with late onset: Secondary | ICD-10-CM | POA: Diagnosis not present

## 2019-12-07 DIAGNOSIS — M19041 Primary osteoarthritis, right hand: Secondary | ICD-10-CM | POA: Diagnosis not present

## 2019-12-07 DIAGNOSIS — M1710 Unilateral primary osteoarthritis, unspecified knee: Secondary | ICD-10-CM | POA: Diagnosis not present

## 2019-12-07 DIAGNOSIS — F0280 Dementia in other diseases classified elsewhere without behavioral disturbance: Secondary | ICD-10-CM | POA: Diagnosis not present

## 2019-12-07 DIAGNOSIS — Z20822 Contact with and (suspected) exposure to covid-19: Secondary | ICD-10-CM | POA: Diagnosis not present

## 2019-12-07 DIAGNOSIS — I10 Essential (primary) hypertension: Secondary | ICD-10-CM | POA: Diagnosis not present

## 2019-12-10 ENCOUNTER — Ambulatory Visit: Payer: Medicare HMO | Admitting: Internal Medicine

## 2019-12-12 DIAGNOSIS — M6281 Muscle weakness (generalized): Secondary | ICD-10-CM | POA: Diagnosis not present

## 2019-12-14 DIAGNOSIS — J449 Chronic obstructive pulmonary disease, unspecified: Secondary | ICD-10-CM | POA: Diagnosis not present

## 2019-12-14 DIAGNOSIS — M19041 Primary osteoarthritis, right hand: Secondary | ICD-10-CM | POA: Diagnosis not present

## 2019-12-14 DIAGNOSIS — F0280 Dementia in other diseases classified elsewhere without behavioral disturbance: Secondary | ICD-10-CM | POA: Diagnosis not present

## 2019-12-14 DIAGNOSIS — G8929 Other chronic pain: Secondary | ICD-10-CM | POA: Diagnosis not present

## 2019-12-14 DIAGNOSIS — G301 Alzheimer's disease with late onset: Secondary | ICD-10-CM | POA: Diagnosis not present

## 2019-12-14 DIAGNOSIS — I10 Essential (primary) hypertension: Secondary | ICD-10-CM | POA: Diagnosis not present

## 2019-12-14 DIAGNOSIS — M1710 Unilateral primary osteoarthritis, unspecified knee: Secondary | ICD-10-CM | POA: Diagnosis not present

## 2019-12-14 DIAGNOSIS — M19042 Primary osteoarthritis, left hand: Secondary | ICD-10-CM | POA: Diagnosis not present

## 2019-12-14 DIAGNOSIS — M069 Rheumatoid arthritis, unspecified: Secondary | ICD-10-CM | POA: Diagnosis not present

## 2019-12-25 DIAGNOSIS — G301 Alzheimer's disease with late onset: Secondary | ICD-10-CM | POA: Diagnosis not present

## 2019-12-25 DIAGNOSIS — F0280 Dementia in other diseases classified elsewhere without behavioral disturbance: Secondary | ICD-10-CM | POA: Diagnosis not present

## 2019-12-25 DIAGNOSIS — I10 Essential (primary) hypertension: Secondary | ICD-10-CM | POA: Diagnosis not present

## 2019-12-25 DIAGNOSIS — M19041 Primary osteoarthritis, right hand: Secondary | ICD-10-CM | POA: Diagnosis not present

## 2019-12-25 DIAGNOSIS — G8929 Other chronic pain: Secondary | ICD-10-CM | POA: Diagnosis not present

## 2019-12-25 DIAGNOSIS — J449 Chronic obstructive pulmonary disease, unspecified: Secondary | ICD-10-CM | POA: Diagnosis not present

## 2019-12-25 DIAGNOSIS — M19042 Primary osteoarthritis, left hand: Secondary | ICD-10-CM | POA: Diagnosis not present

## 2019-12-25 DIAGNOSIS — M1710 Unilateral primary osteoarthritis, unspecified knee: Secondary | ICD-10-CM | POA: Diagnosis not present

## 2019-12-25 DIAGNOSIS — M069 Rheumatoid arthritis, unspecified: Secondary | ICD-10-CM | POA: Diagnosis not present

## 2020-01-11 DIAGNOSIS — Z1152 Encounter for screening for COVID-19: Secondary | ICD-10-CM | POA: Diagnosis not present

## 2020-01-12 DIAGNOSIS — M6281 Muscle weakness (generalized): Secondary | ICD-10-CM | POA: Diagnosis not present

## 2020-01-14 DIAGNOSIS — Z03818 Encounter for observation for suspected exposure to other biological agents ruled out: Secondary | ICD-10-CM | POA: Diagnosis not present

## 2020-01-18 DIAGNOSIS — Z03818 Encounter for observation for suspected exposure to other biological agents ruled out: Secondary | ICD-10-CM | POA: Diagnosis not present

## 2020-01-21 DIAGNOSIS — Z03818 Encounter for observation for suspected exposure to other biological agents ruled out: Secondary | ICD-10-CM | POA: Diagnosis not present

## 2020-01-22 DIAGNOSIS — M069 Rheumatoid arthritis, unspecified: Secondary | ICD-10-CM | POA: Diagnosis not present

## 2020-01-22 DIAGNOSIS — M24541 Contracture, right hand: Secondary | ICD-10-CM | POA: Diagnosis not present

## 2020-01-22 DIAGNOSIS — M6281 Muscle weakness (generalized): Secondary | ICD-10-CM | POA: Diagnosis not present

## 2020-01-26 DIAGNOSIS — Z03818 Encounter for observation for suspected exposure to other biological agents ruled out: Secondary | ICD-10-CM | POA: Diagnosis not present

## 2020-01-27 DIAGNOSIS — M6281 Muscle weakness (generalized): Secondary | ICD-10-CM | POA: Diagnosis not present

## 2020-01-27 DIAGNOSIS — M069 Rheumatoid arthritis, unspecified: Secondary | ICD-10-CM | POA: Diagnosis not present

## 2020-01-27 DIAGNOSIS — M24541 Contracture, right hand: Secondary | ICD-10-CM | POA: Diagnosis not present

## 2020-01-28 DIAGNOSIS — Z03818 Encounter for observation for suspected exposure to other biological agents ruled out: Secondary | ICD-10-CM | POA: Diagnosis not present

## 2020-02-01 DIAGNOSIS — Z03818 Encounter for observation for suspected exposure to other biological agents ruled out: Secondary | ICD-10-CM | POA: Diagnosis not present

## 2020-02-04 DIAGNOSIS — Z03818 Encounter for observation for suspected exposure to other biological agents ruled out: Secondary | ICD-10-CM | POA: Diagnosis not present

## 2020-02-08 DIAGNOSIS — Z03818 Encounter for observation for suspected exposure to other biological agents ruled out: Secondary | ICD-10-CM | POA: Diagnosis not present

## 2020-02-11 DIAGNOSIS — M6281 Muscle weakness (generalized): Secondary | ICD-10-CM | POA: Diagnosis not present

## 2020-02-11 DIAGNOSIS — Z1152 Encounter for screening for COVID-19: Secondary | ICD-10-CM | POA: Diagnosis not present

## 2020-02-11 DIAGNOSIS — M069 Rheumatoid arthritis, unspecified: Secondary | ICD-10-CM | POA: Diagnosis not present

## 2020-02-11 DIAGNOSIS — I69954 Hemiplegia and hemiparesis following unspecified cerebrovascular disease affecting left non-dominant side: Secondary | ICD-10-CM | POA: Diagnosis not present

## 2020-02-11 DIAGNOSIS — R69 Illness, unspecified: Secondary | ICD-10-CM | POA: Diagnosis not present

## 2020-02-11 DIAGNOSIS — M24541 Contracture, right hand: Secondary | ICD-10-CM | POA: Diagnosis not present

## 2020-02-12 DIAGNOSIS — M24541 Contracture, right hand: Secondary | ICD-10-CM | POA: Diagnosis not present

## 2020-02-12 DIAGNOSIS — I69954 Hemiplegia and hemiparesis following unspecified cerebrovascular disease affecting left non-dominant side: Secondary | ICD-10-CM | POA: Diagnosis not present

## 2020-02-12 DIAGNOSIS — M069 Rheumatoid arthritis, unspecified: Secondary | ICD-10-CM | POA: Diagnosis not present

## 2020-02-12 DIAGNOSIS — M6281 Muscle weakness (generalized): Secondary | ICD-10-CM | POA: Diagnosis not present

## 2020-02-15 DIAGNOSIS — Z03818 Encounter for observation for suspected exposure to other biological agents ruled out: Secondary | ICD-10-CM | POA: Diagnosis not present

## 2020-02-18 DIAGNOSIS — Z03818 Encounter for observation for suspected exposure to other biological agents ruled out: Secondary | ICD-10-CM | POA: Diagnosis not present

## 2020-02-22 DIAGNOSIS — Z03818 Encounter for observation for suspected exposure to other biological agents ruled out: Secondary | ICD-10-CM | POA: Diagnosis not present

## 2020-02-24 DIAGNOSIS — M069 Rheumatoid arthritis, unspecified: Secondary | ICD-10-CM | POA: Diagnosis not present

## 2020-02-24 DIAGNOSIS — M24541 Contracture, right hand: Secondary | ICD-10-CM | POA: Diagnosis not present

## 2020-02-24 DIAGNOSIS — M6281 Muscle weakness (generalized): Secondary | ICD-10-CM | POA: Diagnosis not present

## 2020-02-24 DIAGNOSIS — I69954 Hemiplegia and hemiparesis following unspecified cerebrovascular disease affecting left non-dominant side: Secondary | ICD-10-CM | POA: Diagnosis not present

## 2020-02-25 DIAGNOSIS — Z03818 Encounter for observation for suspected exposure to other biological agents ruled out: Secondary | ICD-10-CM | POA: Diagnosis not present

## 2020-02-29 DIAGNOSIS — Z03818 Encounter for observation for suspected exposure to other biological agents ruled out: Secondary | ICD-10-CM | POA: Diagnosis not present

## 2020-03-03 DIAGNOSIS — Z03818 Encounter for observation for suspected exposure to other biological agents ruled out: Secondary | ICD-10-CM | POA: Diagnosis not present

## 2020-03-07 DIAGNOSIS — M069 Rheumatoid arthritis, unspecified: Secondary | ICD-10-CM | POA: Diagnosis not present

## 2020-03-07 DIAGNOSIS — Z03818 Encounter for observation for suspected exposure to other biological agents ruled out: Secondary | ICD-10-CM | POA: Diagnosis not present

## 2020-03-07 DIAGNOSIS — I69954 Hemiplegia and hemiparesis following unspecified cerebrovascular disease affecting left non-dominant side: Secondary | ICD-10-CM | POA: Diagnosis not present

## 2020-03-07 DIAGNOSIS — M6281 Muscle weakness (generalized): Secondary | ICD-10-CM | POA: Diagnosis not present

## 2020-03-07 DIAGNOSIS — M24541 Contracture, right hand: Secondary | ICD-10-CM | POA: Diagnosis not present

## 2020-03-08 DIAGNOSIS — G309 Alzheimer's disease, unspecified: Secondary | ICD-10-CM | POA: Diagnosis not present

## 2020-03-08 DIAGNOSIS — D179 Benign lipomatous neoplasm, unspecified: Secondary | ICD-10-CM | POA: Diagnosis not present

## 2020-03-08 DIAGNOSIS — E039 Hypothyroidism, unspecified: Secondary | ICD-10-CM | POA: Diagnosis not present

## 2020-03-08 DIAGNOSIS — I2510 Atherosclerotic heart disease of native coronary artery without angina pectoris: Secondary | ICD-10-CM | POA: Diagnosis not present

## 2020-03-08 DIAGNOSIS — I10 Essential (primary) hypertension: Secondary | ICD-10-CM | POA: Diagnosis not present

## 2020-03-08 DIAGNOSIS — F0280 Dementia in other diseases classified elsewhere without behavioral disturbance: Secondary | ICD-10-CM | POA: Diagnosis not present

## 2020-03-08 DIAGNOSIS — M1990 Unspecified osteoarthritis, unspecified site: Secondary | ICD-10-CM | POA: Diagnosis not present

## 2020-03-09 DIAGNOSIS — M24541 Contracture, right hand: Secondary | ICD-10-CM | POA: Diagnosis not present

## 2020-03-09 DIAGNOSIS — M6281 Muscle weakness (generalized): Secondary | ICD-10-CM | POA: Diagnosis not present

## 2020-03-09 DIAGNOSIS — M24542 Contracture, left hand: Secondary | ICD-10-CM | POA: Diagnosis not present

## 2020-03-09 DIAGNOSIS — M069 Rheumatoid arthritis, unspecified: Secondary | ICD-10-CM | POA: Diagnosis not present

## 2020-03-09 DIAGNOSIS — I69954 Hemiplegia and hemiparesis following unspecified cerebrovascular disease affecting left non-dominant side: Secondary | ICD-10-CM | POA: Diagnosis not present

## 2020-03-10 DIAGNOSIS — Z03818 Encounter for observation for suspected exposure to other biological agents ruled out: Secondary | ICD-10-CM | POA: Diagnosis not present

## 2020-03-11 DIAGNOSIS — M6281 Muscle weakness (generalized): Secondary | ICD-10-CM | POA: Diagnosis not present

## 2020-03-14 DIAGNOSIS — Z03818 Encounter for observation for suspected exposure to other biological agents ruled out: Secondary | ICD-10-CM | POA: Diagnosis not present

## 2020-03-15 DIAGNOSIS — M6281 Muscle weakness (generalized): Secondary | ICD-10-CM | POA: Diagnosis not present

## 2020-03-15 DIAGNOSIS — M24542 Contracture, left hand: Secondary | ICD-10-CM | POA: Diagnosis not present

## 2020-03-15 DIAGNOSIS — M24541 Contracture, right hand: Secondary | ICD-10-CM | POA: Diagnosis not present

## 2020-03-15 DIAGNOSIS — M069 Rheumatoid arthritis, unspecified: Secondary | ICD-10-CM | POA: Diagnosis not present

## 2020-03-15 DIAGNOSIS — I69954 Hemiplegia and hemiparesis following unspecified cerebrovascular disease affecting left non-dominant side: Secondary | ICD-10-CM | POA: Diagnosis not present

## 2020-03-16 DIAGNOSIS — M069 Rheumatoid arthritis, unspecified: Secondary | ICD-10-CM | POA: Diagnosis not present

## 2020-03-16 DIAGNOSIS — M24542 Contracture, left hand: Secondary | ICD-10-CM | POA: Diagnosis not present

## 2020-03-16 DIAGNOSIS — I69954 Hemiplegia and hemiparesis following unspecified cerebrovascular disease affecting left non-dominant side: Secondary | ICD-10-CM | POA: Diagnosis not present

## 2020-03-16 DIAGNOSIS — M6281 Muscle weakness (generalized): Secondary | ICD-10-CM | POA: Diagnosis not present

## 2020-03-16 DIAGNOSIS — M24541 Contracture, right hand: Secondary | ICD-10-CM | POA: Diagnosis not present

## 2020-03-17 DIAGNOSIS — Z03818 Encounter for observation for suspected exposure to other biological agents ruled out: Secondary | ICD-10-CM | POA: Diagnosis not present

## 2020-03-21 DIAGNOSIS — M069 Rheumatoid arthritis, unspecified: Secondary | ICD-10-CM | POA: Diagnosis not present

## 2020-03-21 DIAGNOSIS — I69954 Hemiplegia and hemiparesis following unspecified cerebrovascular disease affecting left non-dominant side: Secondary | ICD-10-CM | POA: Diagnosis not present

## 2020-03-21 DIAGNOSIS — Z03818 Encounter for observation for suspected exposure to other biological agents ruled out: Secondary | ICD-10-CM | POA: Diagnosis not present

## 2020-03-21 DIAGNOSIS — M24541 Contracture, right hand: Secondary | ICD-10-CM | POA: Diagnosis not present

## 2020-03-21 DIAGNOSIS — M24542 Contracture, left hand: Secondary | ICD-10-CM | POA: Diagnosis not present

## 2020-03-21 DIAGNOSIS — M6281 Muscle weakness (generalized): Secondary | ICD-10-CM | POA: Diagnosis not present

## 2020-03-23 DIAGNOSIS — M6281 Muscle weakness (generalized): Secondary | ICD-10-CM | POA: Diagnosis not present

## 2020-03-23 DIAGNOSIS — M24541 Contracture, right hand: Secondary | ICD-10-CM | POA: Diagnosis not present

## 2020-03-23 DIAGNOSIS — M24542 Contracture, left hand: Secondary | ICD-10-CM | POA: Diagnosis not present

## 2020-03-23 DIAGNOSIS — M069 Rheumatoid arthritis, unspecified: Secondary | ICD-10-CM | POA: Diagnosis not present

## 2020-03-23 DIAGNOSIS — I69954 Hemiplegia and hemiparesis following unspecified cerebrovascular disease affecting left non-dominant side: Secondary | ICD-10-CM | POA: Diagnosis not present

## 2020-03-28 DIAGNOSIS — M069 Rheumatoid arthritis, unspecified: Secondary | ICD-10-CM | POA: Diagnosis not present

## 2020-03-28 DIAGNOSIS — M24542 Contracture, left hand: Secondary | ICD-10-CM | POA: Diagnosis not present

## 2020-03-28 DIAGNOSIS — M24541 Contracture, right hand: Secondary | ICD-10-CM | POA: Diagnosis not present

## 2020-03-28 DIAGNOSIS — M6281 Muscle weakness (generalized): Secondary | ICD-10-CM | POA: Diagnosis not present

## 2020-03-28 DIAGNOSIS — I69954 Hemiplegia and hemiparesis following unspecified cerebrovascular disease affecting left non-dominant side: Secondary | ICD-10-CM | POA: Diagnosis not present

## 2020-03-29 DIAGNOSIS — M069 Rheumatoid arthritis, unspecified: Secondary | ICD-10-CM | POA: Diagnosis not present

## 2020-03-29 DIAGNOSIS — M6281 Muscle weakness (generalized): Secondary | ICD-10-CM | POA: Diagnosis not present

## 2020-03-29 DIAGNOSIS — I69954 Hemiplegia and hemiparesis following unspecified cerebrovascular disease affecting left non-dominant side: Secondary | ICD-10-CM | POA: Diagnosis not present

## 2020-03-29 DIAGNOSIS — M24541 Contracture, right hand: Secondary | ICD-10-CM | POA: Diagnosis not present

## 2020-03-29 DIAGNOSIS — M24542 Contracture, left hand: Secondary | ICD-10-CM | POA: Diagnosis not present

## 2020-04-01 DIAGNOSIS — M6281 Muscle weakness (generalized): Secondary | ICD-10-CM | POA: Diagnosis not present

## 2020-04-01 DIAGNOSIS — I69954 Hemiplegia and hemiparesis following unspecified cerebrovascular disease affecting left non-dominant side: Secondary | ICD-10-CM | POA: Diagnosis not present

## 2020-04-01 DIAGNOSIS — R059 Cough, unspecified: Secondary | ICD-10-CM | POA: Diagnosis not present

## 2020-04-01 DIAGNOSIS — R062 Wheezing: Secondary | ICD-10-CM | POA: Diagnosis not present

## 2020-04-01 DIAGNOSIS — M069 Rheumatoid arthritis, unspecified: Secondary | ICD-10-CM | POA: Diagnosis not present

## 2020-04-01 DIAGNOSIS — R0602 Shortness of breath: Secondary | ICD-10-CM | POA: Diagnosis not present

## 2020-04-01 DIAGNOSIS — M24541 Contracture, right hand: Secondary | ICD-10-CM | POA: Diagnosis not present

## 2020-04-01 DIAGNOSIS — M24542 Contracture, left hand: Secondary | ICD-10-CM | POA: Diagnosis not present

## 2020-04-04 DIAGNOSIS — M24542 Contracture, left hand: Secondary | ICD-10-CM | POA: Diagnosis not present

## 2020-04-04 DIAGNOSIS — M069 Rheumatoid arthritis, unspecified: Secondary | ICD-10-CM | POA: Diagnosis not present

## 2020-04-04 DIAGNOSIS — M24541 Contracture, right hand: Secondary | ICD-10-CM | POA: Diagnosis not present

## 2020-04-04 DIAGNOSIS — I69954 Hemiplegia and hemiparesis following unspecified cerebrovascular disease affecting left non-dominant side: Secondary | ICD-10-CM | POA: Diagnosis not present

## 2020-04-04 DIAGNOSIS — M6281 Muscle weakness (generalized): Secondary | ICD-10-CM | POA: Diagnosis not present

## 2020-04-05 DIAGNOSIS — J449 Chronic obstructive pulmonary disease, unspecified: Secondary | ICD-10-CM | POA: Diagnosis not present

## 2020-04-05 DIAGNOSIS — I10 Essential (primary) hypertension: Secondary | ICD-10-CM | POA: Diagnosis not present

## 2020-04-05 DIAGNOSIS — G309 Alzheimer's disease, unspecified: Secondary | ICD-10-CM | POA: Diagnosis not present

## 2020-04-05 DIAGNOSIS — I2510 Atherosclerotic heart disease of native coronary artery without angina pectoris: Secondary | ICD-10-CM | POA: Diagnosis not present

## 2020-04-07 DIAGNOSIS — F3289 Other specified depressive episodes: Secondary | ICD-10-CM | POA: Diagnosis not present

## 2020-04-07 DIAGNOSIS — F0390 Unspecified dementia without behavioral disturbance: Secondary | ICD-10-CM | POA: Diagnosis not present

## 2020-04-11 DIAGNOSIS — M6281 Muscle weakness (generalized): Secondary | ICD-10-CM | POA: Diagnosis not present

## 2020-04-12 DIAGNOSIS — M6281 Muscle weakness (generalized): Secondary | ICD-10-CM | POA: Diagnosis not present

## 2020-04-12 DIAGNOSIS — M24542 Contracture, left hand: Secondary | ICD-10-CM | POA: Diagnosis not present

## 2020-04-12 DIAGNOSIS — M069 Rheumatoid arthritis, unspecified: Secondary | ICD-10-CM | POA: Diagnosis not present

## 2020-04-12 DIAGNOSIS — I69954 Hemiplegia and hemiparesis following unspecified cerebrovascular disease affecting left non-dominant side: Secondary | ICD-10-CM | POA: Diagnosis not present

## 2020-04-12 DIAGNOSIS — M24541 Contracture, right hand: Secondary | ICD-10-CM | POA: Diagnosis not present

## 2020-04-13 DIAGNOSIS — M069 Rheumatoid arthritis, unspecified: Secondary | ICD-10-CM | POA: Diagnosis not present

## 2020-04-13 DIAGNOSIS — M24541 Contracture, right hand: Secondary | ICD-10-CM | POA: Diagnosis not present

## 2020-04-13 DIAGNOSIS — M6281 Muscle weakness (generalized): Secondary | ICD-10-CM | POA: Diagnosis not present

## 2020-04-13 DIAGNOSIS — M24542 Contracture, left hand: Secondary | ICD-10-CM | POA: Diagnosis not present

## 2020-04-13 DIAGNOSIS — I69954 Hemiplegia and hemiparesis following unspecified cerebrovascular disease affecting left non-dominant side: Secondary | ICD-10-CM | POA: Diagnosis not present

## 2020-04-18 DIAGNOSIS — M24542 Contracture, left hand: Secondary | ICD-10-CM | POA: Diagnosis not present

## 2020-04-18 DIAGNOSIS — M24541 Contracture, right hand: Secondary | ICD-10-CM | POA: Diagnosis not present

## 2020-04-18 DIAGNOSIS — M069 Rheumatoid arthritis, unspecified: Secondary | ICD-10-CM | POA: Diagnosis not present

## 2020-04-18 DIAGNOSIS — I69954 Hemiplegia and hemiparesis following unspecified cerebrovascular disease affecting left non-dominant side: Secondary | ICD-10-CM | POA: Diagnosis not present

## 2020-04-18 DIAGNOSIS — M6281 Muscle weakness (generalized): Secondary | ICD-10-CM | POA: Diagnosis not present

## 2020-04-19 DIAGNOSIS — I10 Essential (primary) hypertension: Secondary | ICD-10-CM | POA: Diagnosis not present

## 2020-04-19 DIAGNOSIS — E039 Hypothyroidism, unspecified: Secondary | ICD-10-CM | POA: Diagnosis not present

## 2020-04-19 DIAGNOSIS — J449 Chronic obstructive pulmonary disease, unspecified: Secondary | ICD-10-CM | POA: Diagnosis not present

## 2020-04-19 DIAGNOSIS — G301 Alzheimer's disease with late onset: Secondary | ICD-10-CM | POA: Diagnosis not present

## 2020-04-19 DIAGNOSIS — R2681 Unsteadiness on feet: Secondary | ICD-10-CM | POA: Diagnosis not present

## 2020-04-19 DIAGNOSIS — F0280 Dementia in other diseases classified elsewhere without behavioral disturbance: Secondary | ICD-10-CM | POA: Diagnosis not present

## 2020-04-19 DIAGNOSIS — M159 Polyosteoarthritis, unspecified: Secondary | ICD-10-CM | POA: Diagnosis not present

## 2020-04-19 DIAGNOSIS — F0150 Vascular dementia without behavioral disturbance: Secondary | ICD-10-CM | POA: Diagnosis not present

## 2020-04-21 DIAGNOSIS — E119 Type 2 diabetes mellitus without complications: Secondary | ICD-10-CM | POA: Diagnosis not present

## 2020-04-21 DIAGNOSIS — M6281 Muscle weakness (generalized): Secondary | ICD-10-CM | POA: Diagnosis not present

## 2020-04-21 DIAGNOSIS — M24542 Contracture, left hand: Secondary | ICD-10-CM | POA: Diagnosis not present

## 2020-04-21 DIAGNOSIS — M069 Rheumatoid arthritis, unspecified: Secondary | ICD-10-CM | POA: Diagnosis not present

## 2020-04-21 DIAGNOSIS — M24541 Contracture, right hand: Secondary | ICD-10-CM | POA: Diagnosis not present

## 2020-04-21 DIAGNOSIS — I69954 Hemiplegia and hemiparesis following unspecified cerebrovascular disease affecting left non-dominant side: Secondary | ICD-10-CM | POA: Diagnosis not present

## 2020-04-21 DIAGNOSIS — E559 Vitamin D deficiency, unspecified: Secondary | ICD-10-CM | POA: Diagnosis not present

## 2020-04-22 DIAGNOSIS — N390 Urinary tract infection, site not specified: Secondary | ICD-10-CM | POA: Diagnosis not present

## 2020-04-25 DIAGNOSIS — M069 Rheumatoid arthritis, unspecified: Secondary | ICD-10-CM | POA: Diagnosis not present

## 2020-04-25 DIAGNOSIS — I69954 Hemiplegia and hemiparesis following unspecified cerebrovascular disease affecting left non-dominant side: Secondary | ICD-10-CM | POA: Diagnosis not present

## 2020-04-25 DIAGNOSIS — M24541 Contracture, right hand: Secondary | ICD-10-CM | POA: Diagnosis not present

## 2020-04-25 DIAGNOSIS — M6281 Muscle weakness (generalized): Secondary | ICD-10-CM | POA: Diagnosis not present

## 2020-04-25 DIAGNOSIS — M24542 Contracture, left hand: Secondary | ICD-10-CM | POA: Diagnosis not present

## 2020-04-26 DIAGNOSIS — D72829 Elevated white blood cell count, unspecified: Secondary | ICD-10-CM | POA: Diagnosis not present

## 2020-04-26 DIAGNOSIS — F0150 Vascular dementia without behavioral disturbance: Secondary | ICD-10-CM | POA: Diagnosis not present

## 2020-04-26 DIAGNOSIS — M159 Polyosteoarthritis, unspecified: Secondary | ICD-10-CM | POA: Diagnosis not present

## 2020-04-26 DIAGNOSIS — J449 Chronic obstructive pulmonary disease, unspecified: Secondary | ICD-10-CM | POA: Diagnosis not present

## 2020-04-27 DIAGNOSIS — M24541 Contracture, right hand: Secondary | ICD-10-CM | POA: Diagnosis not present

## 2020-04-27 DIAGNOSIS — M6281 Muscle weakness (generalized): Secondary | ICD-10-CM | POA: Diagnosis not present

## 2020-04-27 DIAGNOSIS — M069 Rheumatoid arthritis, unspecified: Secondary | ICD-10-CM | POA: Diagnosis not present

## 2020-04-27 DIAGNOSIS — I69954 Hemiplegia and hemiparesis following unspecified cerebrovascular disease affecting left non-dominant side: Secondary | ICD-10-CM | POA: Diagnosis not present

## 2020-04-27 DIAGNOSIS — M24542 Contracture, left hand: Secondary | ICD-10-CM | POA: Diagnosis not present

## 2020-05-02 DIAGNOSIS — M6281 Muscle weakness (generalized): Secondary | ICD-10-CM | POA: Diagnosis not present

## 2020-05-02 DIAGNOSIS — M069 Rheumatoid arthritis, unspecified: Secondary | ICD-10-CM | POA: Diagnosis not present

## 2020-05-02 DIAGNOSIS — M24542 Contracture, left hand: Secondary | ICD-10-CM | POA: Diagnosis not present

## 2020-05-02 DIAGNOSIS — M24541 Contracture, right hand: Secondary | ICD-10-CM | POA: Diagnosis not present

## 2020-05-02 DIAGNOSIS — I69954 Hemiplegia and hemiparesis following unspecified cerebrovascular disease affecting left non-dominant side: Secondary | ICD-10-CM | POA: Diagnosis not present

## 2020-05-03 DIAGNOSIS — G301 Alzheimer's disease with late onset: Secondary | ICD-10-CM | POA: Diagnosis not present

## 2020-05-03 DIAGNOSIS — N390 Urinary tract infection, site not specified: Secondary | ICD-10-CM | POA: Diagnosis not present

## 2020-05-03 DIAGNOSIS — E039 Hypothyroidism, unspecified: Secondary | ICD-10-CM | POA: Diagnosis not present

## 2020-05-03 DIAGNOSIS — F0150 Vascular dementia without behavioral disturbance: Secondary | ICD-10-CM | POA: Diagnosis not present

## 2020-05-05 DIAGNOSIS — I69954 Hemiplegia and hemiparesis following unspecified cerebrovascular disease affecting left non-dominant side: Secondary | ICD-10-CM | POA: Diagnosis not present

## 2020-05-05 DIAGNOSIS — M069 Rheumatoid arthritis, unspecified: Secondary | ICD-10-CM | POA: Diagnosis not present

## 2020-05-05 DIAGNOSIS — M24542 Contracture, left hand: Secondary | ICD-10-CM | POA: Diagnosis not present

## 2020-05-05 DIAGNOSIS — M24541 Contracture, right hand: Secondary | ICD-10-CM | POA: Diagnosis not present

## 2020-05-05 DIAGNOSIS — M6281 Muscle weakness (generalized): Secondary | ICD-10-CM | POA: Diagnosis not present

## 2020-05-09 DIAGNOSIS — Z03818 Encounter for observation for suspected exposure to other biological agents ruled out: Secondary | ICD-10-CM | POA: Diagnosis not present

## 2020-05-11 DIAGNOSIS — M24542 Contracture, left hand: Secondary | ICD-10-CM | POA: Diagnosis not present

## 2020-05-11 DIAGNOSIS — M6281 Muscle weakness (generalized): Secondary | ICD-10-CM | POA: Diagnosis not present

## 2020-05-11 DIAGNOSIS — M069 Rheumatoid arthritis, unspecified: Secondary | ICD-10-CM | POA: Diagnosis not present

## 2020-05-11 DIAGNOSIS — I69954 Hemiplegia and hemiparesis following unspecified cerebrovascular disease affecting left non-dominant side: Secondary | ICD-10-CM | POA: Diagnosis not present

## 2020-05-11 DIAGNOSIS — M24541 Contracture, right hand: Secondary | ICD-10-CM | POA: Diagnosis not present

## 2020-05-12 DIAGNOSIS — M069 Rheumatoid arthritis, unspecified: Secondary | ICD-10-CM | POA: Diagnosis not present

## 2020-05-12 DIAGNOSIS — M24542 Contracture, left hand: Secondary | ICD-10-CM | POA: Diagnosis not present

## 2020-05-12 DIAGNOSIS — M24541 Contracture, right hand: Secondary | ICD-10-CM | POA: Diagnosis not present

## 2020-05-12 DIAGNOSIS — I69954 Hemiplegia and hemiparesis following unspecified cerebrovascular disease affecting left non-dominant side: Secondary | ICD-10-CM | POA: Diagnosis not present

## 2020-05-12 DIAGNOSIS — M6281 Muscle weakness (generalized): Secondary | ICD-10-CM | POA: Diagnosis not present

## 2020-05-17 DIAGNOSIS — M6281 Muscle weakness (generalized): Secondary | ICD-10-CM | POA: Diagnosis not present

## 2020-05-17 DIAGNOSIS — I69954 Hemiplegia and hemiparesis following unspecified cerebrovascular disease affecting left non-dominant side: Secondary | ICD-10-CM | POA: Diagnosis not present

## 2020-05-17 DIAGNOSIS — M069 Rheumatoid arthritis, unspecified: Secondary | ICD-10-CM | POA: Diagnosis not present

## 2020-05-17 DIAGNOSIS — M24541 Contracture, right hand: Secondary | ICD-10-CM | POA: Diagnosis not present

## 2020-05-17 DIAGNOSIS — M24542 Contracture, left hand: Secondary | ICD-10-CM | POA: Diagnosis not present

## 2020-05-19 DIAGNOSIS — M6281 Muscle weakness (generalized): Secondary | ICD-10-CM | POA: Diagnosis not present

## 2020-05-19 DIAGNOSIS — M24542 Contracture, left hand: Secondary | ICD-10-CM | POA: Diagnosis not present

## 2020-05-19 DIAGNOSIS — I69954 Hemiplegia and hemiparesis following unspecified cerebrovascular disease affecting left non-dominant side: Secondary | ICD-10-CM | POA: Diagnosis not present

## 2020-05-19 DIAGNOSIS — M24541 Contracture, right hand: Secondary | ICD-10-CM | POA: Diagnosis not present

## 2020-05-19 DIAGNOSIS — M069 Rheumatoid arthritis, unspecified: Secondary | ICD-10-CM | POA: Diagnosis not present

## 2020-05-24 DIAGNOSIS — M069 Rheumatoid arthritis, unspecified: Secondary | ICD-10-CM | POA: Diagnosis not present

## 2020-05-24 DIAGNOSIS — M6281 Muscle weakness (generalized): Secondary | ICD-10-CM | POA: Diagnosis not present

## 2020-05-24 DIAGNOSIS — M24542 Contracture, left hand: Secondary | ICD-10-CM | POA: Diagnosis not present

## 2020-05-24 DIAGNOSIS — M24541 Contracture, right hand: Secondary | ICD-10-CM | POA: Diagnosis not present

## 2020-05-24 DIAGNOSIS — I69954 Hemiplegia and hemiparesis following unspecified cerebrovascular disease affecting left non-dominant side: Secondary | ICD-10-CM | POA: Diagnosis not present

## 2020-05-25 DIAGNOSIS — M24542 Contracture, left hand: Secondary | ICD-10-CM | POA: Diagnosis not present

## 2020-05-25 DIAGNOSIS — M24541 Contracture, right hand: Secondary | ICD-10-CM | POA: Diagnosis not present

## 2020-05-25 DIAGNOSIS — I69954 Hemiplegia and hemiparesis following unspecified cerebrovascular disease affecting left non-dominant side: Secondary | ICD-10-CM | POA: Diagnosis not present

## 2020-05-25 DIAGNOSIS — M069 Rheumatoid arthritis, unspecified: Secondary | ICD-10-CM | POA: Diagnosis not present

## 2020-05-25 DIAGNOSIS — M6281 Muscle weakness (generalized): Secondary | ICD-10-CM | POA: Diagnosis not present

## 2020-05-31 DIAGNOSIS — I69954 Hemiplegia and hemiparesis following unspecified cerebrovascular disease affecting left non-dominant side: Secondary | ICD-10-CM | POA: Diagnosis not present

## 2020-05-31 DIAGNOSIS — M6281 Muscle weakness (generalized): Secondary | ICD-10-CM | POA: Diagnosis not present

## 2020-05-31 DIAGNOSIS — M069 Rheumatoid arthritis, unspecified: Secondary | ICD-10-CM | POA: Diagnosis not present

## 2020-05-31 DIAGNOSIS — M24541 Contracture, right hand: Secondary | ICD-10-CM | POA: Diagnosis not present

## 2020-05-31 DIAGNOSIS — M24542 Contracture, left hand: Secondary | ICD-10-CM | POA: Diagnosis not present

## 2020-06-02 DIAGNOSIS — M24541 Contracture, right hand: Secondary | ICD-10-CM | POA: Diagnosis not present

## 2020-06-02 DIAGNOSIS — M069 Rheumatoid arthritis, unspecified: Secondary | ICD-10-CM | POA: Diagnosis not present

## 2020-06-02 DIAGNOSIS — I69954 Hemiplegia and hemiparesis following unspecified cerebrovascular disease affecting left non-dominant side: Secondary | ICD-10-CM | POA: Diagnosis not present

## 2020-06-02 DIAGNOSIS — M24542 Contracture, left hand: Secondary | ICD-10-CM | POA: Diagnosis not present

## 2020-06-02 DIAGNOSIS — M6281 Muscle weakness (generalized): Secondary | ICD-10-CM | POA: Diagnosis not present

## 2020-06-07 DIAGNOSIS — T887XXA Unspecified adverse effect of drug or medicament, initial encounter: Secondary | ICD-10-CM | POA: Diagnosis not present

## 2020-06-07 DIAGNOSIS — M24541 Contracture, right hand: Secondary | ICD-10-CM | POA: Diagnosis not present

## 2020-06-07 DIAGNOSIS — M069 Rheumatoid arthritis, unspecified: Secondary | ICD-10-CM | POA: Diagnosis not present

## 2020-06-07 DIAGNOSIS — I69954 Hemiplegia and hemiparesis following unspecified cerebrovascular disease affecting left non-dominant side: Secondary | ICD-10-CM | POA: Diagnosis not present

## 2020-06-07 DIAGNOSIS — G301 Alzheimer's disease with late onset: Secondary | ICD-10-CM | POA: Diagnosis not present

## 2020-06-07 DIAGNOSIS — T8089XA Other complications following infusion, transfusion and therapeutic injection, initial encounter: Secondary | ICD-10-CM | POA: Diagnosis not present

## 2020-06-07 DIAGNOSIS — M6281 Muscle weakness (generalized): Secondary | ICD-10-CM | POA: Diagnosis not present

## 2020-06-07 DIAGNOSIS — M24542 Contracture, left hand: Secondary | ICD-10-CM | POA: Diagnosis not present

## 2020-06-07 DIAGNOSIS — E039 Hypothyroidism, unspecified: Secondary | ICD-10-CM | POA: Diagnosis not present

## 2020-06-07 DIAGNOSIS — J449 Chronic obstructive pulmonary disease, unspecified: Secondary | ICD-10-CM | POA: Diagnosis not present

## 2020-06-09 DIAGNOSIS — M24541 Contracture, right hand: Secondary | ICD-10-CM | POA: Diagnosis not present

## 2020-06-09 DIAGNOSIS — M24542 Contracture, left hand: Secondary | ICD-10-CM | POA: Diagnosis not present

## 2020-06-09 DIAGNOSIS — M069 Rheumatoid arthritis, unspecified: Secondary | ICD-10-CM | POA: Diagnosis not present

## 2020-06-09 DIAGNOSIS — M6281 Muscle weakness (generalized): Secondary | ICD-10-CM | POA: Diagnosis not present

## 2020-06-09 DIAGNOSIS — I69954 Hemiplegia and hemiparesis following unspecified cerebrovascular disease affecting left non-dominant side: Secondary | ICD-10-CM | POA: Diagnosis not present

## 2020-06-11 DIAGNOSIS — M6281 Muscle weakness (generalized): Secondary | ICD-10-CM | POA: Diagnosis not present

## 2020-06-14 DIAGNOSIS — M24541 Contracture, right hand: Secondary | ICD-10-CM | POA: Diagnosis not present

## 2020-06-14 DIAGNOSIS — M6281 Muscle weakness (generalized): Secondary | ICD-10-CM | POA: Diagnosis not present

## 2020-06-14 DIAGNOSIS — M069 Rheumatoid arthritis, unspecified: Secondary | ICD-10-CM | POA: Diagnosis not present

## 2020-06-14 DIAGNOSIS — M24542 Contracture, left hand: Secondary | ICD-10-CM | POA: Diagnosis not present

## 2020-06-14 DIAGNOSIS — I69954 Hemiplegia and hemiparesis following unspecified cerebrovascular disease affecting left non-dominant side: Secondary | ICD-10-CM | POA: Diagnosis not present

## 2020-06-16 DIAGNOSIS — I69954 Hemiplegia and hemiparesis following unspecified cerebrovascular disease affecting left non-dominant side: Secondary | ICD-10-CM | POA: Diagnosis not present

## 2020-06-16 DIAGNOSIS — M24541 Contracture, right hand: Secondary | ICD-10-CM | POA: Diagnosis not present

## 2020-06-16 DIAGNOSIS — M069 Rheumatoid arthritis, unspecified: Secondary | ICD-10-CM | POA: Diagnosis not present

## 2020-06-16 DIAGNOSIS — M24542 Contracture, left hand: Secondary | ICD-10-CM | POA: Diagnosis not present

## 2020-06-16 DIAGNOSIS — M6281 Muscle weakness (generalized): Secondary | ICD-10-CM | POA: Diagnosis not present

## 2020-06-21 DIAGNOSIS — M069 Rheumatoid arthritis, unspecified: Secondary | ICD-10-CM | POA: Diagnosis not present

## 2020-06-21 DIAGNOSIS — I69954 Hemiplegia and hemiparesis following unspecified cerebrovascular disease affecting left non-dominant side: Secondary | ICD-10-CM | POA: Diagnosis not present

## 2020-06-21 DIAGNOSIS — M24542 Contracture, left hand: Secondary | ICD-10-CM | POA: Diagnosis not present

## 2020-06-21 DIAGNOSIS — M6281 Muscle weakness (generalized): Secondary | ICD-10-CM | POA: Diagnosis not present

## 2020-06-21 DIAGNOSIS — M24541 Contracture, right hand: Secondary | ICD-10-CM | POA: Diagnosis not present

## 2020-06-23 DIAGNOSIS — M24542 Contracture, left hand: Secondary | ICD-10-CM | POA: Diagnosis not present

## 2020-06-23 DIAGNOSIS — I69954 Hemiplegia and hemiparesis following unspecified cerebrovascular disease affecting left non-dominant side: Secondary | ICD-10-CM | POA: Diagnosis not present

## 2020-06-23 DIAGNOSIS — M6281 Muscle weakness (generalized): Secondary | ICD-10-CM | POA: Diagnosis not present

## 2020-06-23 DIAGNOSIS — M24541 Contracture, right hand: Secondary | ICD-10-CM | POA: Diagnosis not present

## 2020-06-23 DIAGNOSIS — M069 Rheumatoid arthritis, unspecified: Secondary | ICD-10-CM | POA: Diagnosis not present

## 2020-06-28 DIAGNOSIS — M24542 Contracture, left hand: Secondary | ICD-10-CM | POA: Diagnosis not present

## 2020-06-28 DIAGNOSIS — M069 Rheumatoid arthritis, unspecified: Secondary | ICD-10-CM | POA: Diagnosis not present

## 2020-06-28 DIAGNOSIS — I69954 Hemiplegia and hemiparesis following unspecified cerebrovascular disease affecting left non-dominant side: Secondary | ICD-10-CM | POA: Diagnosis not present

## 2020-06-28 DIAGNOSIS — M6281 Muscle weakness (generalized): Secondary | ICD-10-CM | POA: Diagnosis not present

## 2020-06-28 DIAGNOSIS — M24541 Contracture, right hand: Secondary | ICD-10-CM | POA: Diagnosis not present

## 2020-06-30 DIAGNOSIS — M24541 Contracture, right hand: Secondary | ICD-10-CM | POA: Diagnosis not present

## 2020-06-30 DIAGNOSIS — M24542 Contracture, left hand: Secondary | ICD-10-CM | POA: Diagnosis not present

## 2020-06-30 DIAGNOSIS — I69954 Hemiplegia and hemiparesis following unspecified cerebrovascular disease affecting left non-dominant side: Secondary | ICD-10-CM | POA: Diagnosis not present

## 2020-06-30 DIAGNOSIS — M6281 Muscle weakness (generalized): Secondary | ICD-10-CM | POA: Diagnosis not present

## 2020-06-30 DIAGNOSIS — M069 Rheumatoid arthritis, unspecified: Secondary | ICD-10-CM | POA: Diagnosis not present

## 2020-07-04 DIAGNOSIS — M24542 Contracture, left hand: Secondary | ICD-10-CM | POA: Diagnosis not present

## 2020-07-04 DIAGNOSIS — M069 Rheumatoid arthritis, unspecified: Secondary | ICD-10-CM | POA: Diagnosis not present

## 2020-07-04 DIAGNOSIS — M24541 Contracture, right hand: Secondary | ICD-10-CM | POA: Diagnosis not present

## 2020-07-04 DIAGNOSIS — M6281 Muscle weakness (generalized): Secondary | ICD-10-CM | POA: Diagnosis not present

## 2020-07-04 DIAGNOSIS — I69954 Hemiplegia and hemiparesis following unspecified cerebrovascular disease affecting left non-dominant side: Secondary | ICD-10-CM | POA: Diagnosis not present

## 2020-07-07 DIAGNOSIS — M24542 Contracture, left hand: Secondary | ICD-10-CM | POA: Diagnosis not present

## 2020-07-07 DIAGNOSIS — M069 Rheumatoid arthritis, unspecified: Secondary | ICD-10-CM | POA: Diagnosis not present

## 2020-07-07 DIAGNOSIS — M24541 Contracture, right hand: Secondary | ICD-10-CM | POA: Diagnosis not present

## 2020-07-07 DIAGNOSIS — I69954 Hemiplegia and hemiparesis following unspecified cerebrovascular disease affecting left non-dominant side: Secondary | ICD-10-CM | POA: Diagnosis not present

## 2020-07-07 DIAGNOSIS — M6281 Muscle weakness (generalized): Secondary | ICD-10-CM | POA: Diagnosis not present

## 2020-07-11 DIAGNOSIS — M6281 Muscle weakness (generalized): Secondary | ICD-10-CM | POA: Diagnosis not present

## 2020-07-12 DIAGNOSIS — M24542 Contracture, left hand: Secondary | ICD-10-CM | POA: Diagnosis not present

## 2020-07-12 DIAGNOSIS — M24541 Contracture, right hand: Secondary | ICD-10-CM | POA: Diagnosis not present

## 2020-07-12 DIAGNOSIS — M6281 Muscle weakness (generalized): Secondary | ICD-10-CM | POA: Diagnosis not present

## 2020-07-12 DIAGNOSIS — I69954 Hemiplegia and hemiparesis following unspecified cerebrovascular disease affecting left non-dominant side: Secondary | ICD-10-CM | POA: Diagnosis not present

## 2020-07-12 DIAGNOSIS — M069 Rheumatoid arthritis, unspecified: Secondary | ICD-10-CM | POA: Diagnosis not present

## 2020-07-14 DIAGNOSIS — M24542 Contracture, left hand: Secondary | ICD-10-CM | POA: Diagnosis not present

## 2020-07-14 DIAGNOSIS — M069 Rheumatoid arthritis, unspecified: Secondary | ICD-10-CM | POA: Diagnosis not present

## 2020-07-14 DIAGNOSIS — I69954 Hemiplegia and hemiparesis following unspecified cerebrovascular disease affecting left non-dominant side: Secondary | ICD-10-CM | POA: Diagnosis not present

## 2020-07-14 DIAGNOSIS — M6281 Muscle weakness (generalized): Secondary | ICD-10-CM | POA: Diagnosis not present

## 2020-07-14 DIAGNOSIS — M24541 Contracture, right hand: Secondary | ICD-10-CM | POA: Diagnosis not present

## 2020-08-09 DIAGNOSIS — I2510 Atherosclerotic heart disease of native coronary artery without angina pectoris: Secondary | ICD-10-CM | POA: Diagnosis not present

## 2020-08-09 DIAGNOSIS — J449 Chronic obstructive pulmonary disease, unspecified: Secondary | ICD-10-CM | POA: Diagnosis not present

## 2020-08-09 DIAGNOSIS — G301 Alzheimer's disease with late onset: Secondary | ICD-10-CM | POA: Diagnosis not present

## 2020-08-09 DIAGNOSIS — M159 Polyosteoarthritis, unspecified: Secondary | ICD-10-CM | POA: Diagnosis not present

## 2020-09-05 DIAGNOSIS — F3289 Other specified depressive episodes: Secondary | ICD-10-CM | POA: Diagnosis not present

## 2020-09-05 DIAGNOSIS — F0390 Unspecified dementia without behavioral disturbance: Secondary | ICD-10-CM | POA: Diagnosis not present

## 2020-09-06 DIAGNOSIS — G301 Alzheimer's disease with late onset: Secondary | ICD-10-CM | POA: Diagnosis not present

## 2020-09-06 DIAGNOSIS — M159 Polyosteoarthritis, unspecified: Secondary | ICD-10-CM | POA: Diagnosis not present

## 2020-09-06 DIAGNOSIS — I2510 Atherosclerotic heart disease of native coronary artery without angina pectoris: Secondary | ICD-10-CM | POA: Diagnosis not present

## 2020-09-06 DIAGNOSIS — I10 Essential (primary) hypertension: Secondary | ICD-10-CM | POA: Diagnosis not present

## 2020-09-21 DIAGNOSIS — N390 Urinary tract infection, site not specified: Secondary | ICD-10-CM | POA: Diagnosis not present

## 2020-09-27 DIAGNOSIS — G301 Alzheimer's disease with late onset: Secondary | ICD-10-CM | POA: Diagnosis not present

## 2020-09-27 DIAGNOSIS — I10 Essential (primary) hypertension: Secondary | ICD-10-CM | POA: Diagnosis not present

## 2020-09-27 DIAGNOSIS — N390 Urinary tract infection, site not specified: Secondary | ICD-10-CM | POA: Diagnosis not present

## 2020-09-29 DIAGNOSIS — F3289 Other specified depressive episodes: Secondary | ICD-10-CM | POA: Diagnosis not present

## 2020-09-29 DIAGNOSIS — F0390 Unspecified dementia without behavioral disturbance: Secondary | ICD-10-CM | POA: Diagnosis not present

## 2020-10-04 DIAGNOSIS — M159 Polyosteoarthritis, unspecified: Secondary | ICD-10-CM | POA: Diagnosis not present

## 2020-10-04 DIAGNOSIS — G301 Alzheimer's disease with late onset: Secondary | ICD-10-CM | POA: Diagnosis not present

## 2020-10-04 DIAGNOSIS — E039 Hypothyroidism, unspecified: Secondary | ICD-10-CM | POA: Diagnosis not present

## 2020-10-18 DIAGNOSIS — F3289 Other specified depressive episodes: Secondary | ICD-10-CM | POA: Diagnosis not present

## 2020-10-18 DIAGNOSIS — F0150 Vascular dementia without behavioral disturbance: Secondary | ICD-10-CM | POA: Diagnosis not present

## 2020-10-18 DIAGNOSIS — M159 Polyosteoarthritis, unspecified: Secondary | ICD-10-CM | POA: Diagnosis not present

## 2020-10-18 DIAGNOSIS — I10 Essential (primary) hypertension: Secondary | ICD-10-CM | POA: Diagnosis not present

## 2020-10-18 DIAGNOSIS — M130 Polyarthritis, unspecified: Secondary | ICD-10-CM | POA: Diagnosis not present

## 2020-10-18 DIAGNOSIS — J449 Chronic obstructive pulmonary disease, unspecified: Secondary | ICD-10-CM | POA: Diagnosis not present

## 2020-10-19 DIAGNOSIS — Z20822 Contact with and (suspected) exposure to covid-19: Secondary | ICD-10-CM | POA: Diagnosis not present

## 2020-10-19 DIAGNOSIS — R82998 Other abnormal findings in urine: Secondary | ICD-10-CM | POA: Diagnosis not present

## 2020-10-19 DIAGNOSIS — N3000 Acute cystitis without hematuria: Secondary | ICD-10-CM | POA: Diagnosis not present

## 2020-10-19 DIAGNOSIS — J449 Chronic obstructive pulmonary disease, unspecified: Secondary | ICD-10-CM | POA: Diagnosis not present

## 2020-10-19 DIAGNOSIS — G4733 Obstructive sleep apnea (adult) (pediatric): Secondary | ICD-10-CM | POA: Diagnosis not present

## 2020-10-19 DIAGNOSIS — J984 Other disorders of lung: Secondary | ICD-10-CM | POA: Diagnosis not present

## 2020-10-19 DIAGNOSIS — N3001 Acute cystitis with hematuria: Secondary | ICD-10-CM | POA: Diagnosis not present

## 2020-10-19 DIAGNOSIS — R410 Disorientation, unspecified: Secondary | ICD-10-CM | POA: Diagnosis not present

## 2020-10-19 DIAGNOSIS — R400 Somnolence: Secondary | ICD-10-CM | POA: Diagnosis not present

## 2020-10-19 DIAGNOSIS — R32 Unspecified urinary incontinence: Secondary | ICD-10-CM | POA: Diagnosis not present

## 2020-10-19 DIAGNOSIS — G9341 Metabolic encephalopathy: Secondary | ICD-10-CM | POA: Diagnosis not present

## 2020-10-19 DIAGNOSIS — G309 Alzheimer's disease, unspecified: Secondary | ICD-10-CM | POA: Diagnosis not present

## 2020-10-19 DIAGNOSIS — R531 Weakness: Secondary | ICD-10-CM | POA: Diagnosis not present

## 2020-10-19 DIAGNOSIS — E039 Hypothyroidism, unspecified: Secondary | ICD-10-CM | POA: Diagnosis not present

## 2020-10-19 DIAGNOSIS — N281 Cyst of kidney, acquired: Secondary | ICD-10-CM | POA: Diagnosis not present

## 2020-10-19 DIAGNOSIS — E860 Dehydration: Secondary | ICD-10-CM | POA: Diagnosis not present

## 2020-10-19 DIAGNOSIS — N200 Calculus of kidney: Secondary | ICD-10-CM | POA: Diagnosis not present

## 2020-10-19 DIAGNOSIS — N390 Urinary tract infection, site not specified: Secondary | ICD-10-CM | POA: Diagnosis not present

## 2020-10-19 DIAGNOSIS — K5900 Constipation, unspecified: Secondary | ICD-10-CM | POA: Diagnosis not present

## 2020-10-19 DIAGNOSIS — F0150 Vascular dementia without behavioral disturbance: Secondary | ICD-10-CM | POA: Diagnosis not present

## 2020-10-20 DIAGNOSIS — E039 Hypothyroidism, unspecified: Secondary | ICD-10-CM | POA: Diagnosis not present

## 2020-10-20 DIAGNOSIS — N200 Calculus of kidney: Secondary | ICD-10-CM | POA: Diagnosis not present

## 2020-10-20 DIAGNOSIS — N3000 Acute cystitis without hematuria: Secondary | ICD-10-CM | POA: Diagnosis not present

## 2020-10-20 DIAGNOSIS — G4733 Obstructive sleep apnea (adult) (pediatric): Secondary | ICD-10-CM | POA: Diagnosis not present

## 2020-10-20 DIAGNOSIS — J449 Chronic obstructive pulmonary disease, unspecified: Secondary | ICD-10-CM | POA: Diagnosis not present

## 2020-10-20 DIAGNOSIS — G309 Alzheimer's disease, unspecified: Secondary | ICD-10-CM | POA: Diagnosis not present

## 2020-10-20 DIAGNOSIS — E860 Dehydration: Secondary | ICD-10-CM | POA: Diagnosis not present

## 2020-10-20 DIAGNOSIS — G9341 Metabolic encephalopathy: Secondary | ICD-10-CM | POA: Diagnosis not present

## 2020-10-20 DIAGNOSIS — R531 Weakness: Secondary | ICD-10-CM | POA: Diagnosis not present

## 2020-10-21 DIAGNOSIS — R0902 Hypoxemia: Secondary | ICD-10-CM | POA: Diagnosis not present

## 2020-10-21 DIAGNOSIS — Z743 Need for continuous supervision: Secondary | ICD-10-CM | POA: Diagnosis not present

## 2020-10-21 DIAGNOSIS — G9341 Metabolic encephalopathy: Secondary | ICD-10-CM | POA: Diagnosis not present

## 2020-10-21 DIAGNOSIS — N3000 Acute cystitis without hematuria: Secondary | ICD-10-CM | POA: Diagnosis not present

## 2020-10-21 DIAGNOSIS — G4733 Obstructive sleep apnea (adult) (pediatric): Secondary | ICD-10-CM | POA: Diagnosis not present

## 2020-10-21 DIAGNOSIS — G309 Alzheimer's disease, unspecified: Secondary | ICD-10-CM | POA: Diagnosis not present

## 2020-10-21 DIAGNOSIS — E039 Hypothyroidism, unspecified: Secondary | ICD-10-CM | POA: Diagnosis not present

## 2020-10-21 DIAGNOSIS — N200 Calculus of kidney: Secondary | ICD-10-CM | POA: Diagnosis not present

## 2020-10-21 DIAGNOSIS — R531 Weakness: Secondary | ICD-10-CM | POA: Diagnosis not present

## 2020-10-21 DIAGNOSIS — R404 Transient alteration of awareness: Secondary | ICD-10-CM | POA: Diagnosis not present

## 2020-10-21 DIAGNOSIS — J449 Chronic obstructive pulmonary disease, unspecified: Secondary | ICD-10-CM | POA: Diagnosis not present

## 2020-10-21 DIAGNOSIS — E860 Dehydration: Secondary | ICD-10-CM | POA: Diagnosis not present

## 2020-10-25 DIAGNOSIS — J449 Chronic obstructive pulmonary disease, unspecified: Secondary | ICD-10-CM | POA: Diagnosis not present

## 2020-10-25 DIAGNOSIS — G301 Alzheimer's disease with late onset: Secondary | ICD-10-CM | POA: Diagnosis not present

## 2020-10-25 DIAGNOSIS — N390 Urinary tract infection, site not specified: Secondary | ICD-10-CM | POA: Diagnosis not present

## 2020-10-25 DIAGNOSIS — M159 Polyosteoarthritis, unspecified: Secondary | ICD-10-CM | POA: Diagnosis not present

## 2020-10-27 DIAGNOSIS — E559 Vitamin D deficiency, unspecified: Secondary | ICD-10-CM | POA: Diagnosis not present

## 2020-10-27 DIAGNOSIS — E119 Type 2 diabetes mellitus without complications: Secondary | ICD-10-CM | POA: Diagnosis not present

## 2020-11-01 DIAGNOSIS — I10 Essential (primary) hypertension: Secondary | ICD-10-CM | POA: Diagnosis not present

## 2020-11-01 DIAGNOSIS — E039 Hypothyroidism, unspecified: Secondary | ICD-10-CM | POA: Diagnosis not present

## 2020-11-01 DIAGNOSIS — G301 Alzheimer's disease with late onset: Secondary | ICD-10-CM | POA: Diagnosis not present

## 2020-11-15 DIAGNOSIS — G301 Alzheimer's disease with late onset: Secondary | ICD-10-CM | POA: Diagnosis not present

## 2020-11-15 DIAGNOSIS — U071 COVID-19: Secondary | ICD-10-CM | POA: Diagnosis not present

## 2020-11-15 DIAGNOSIS — F0150 Vascular dementia without behavioral disturbance: Secondary | ICD-10-CM | POA: Diagnosis not present

## 2020-11-15 DIAGNOSIS — J00 Acute nasopharyngitis [common cold]: Secondary | ICD-10-CM | POA: Diagnosis not present

## 2020-11-15 DIAGNOSIS — I10 Essential (primary) hypertension: Secondary | ICD-10-CM | POA: Diagnosis not present

## 2020-11-22 DIAGNOSIS — G9341 Metabolic encephalopathy: Secondary | ICD-10-CM | POA: Diagnosis not present

## 2020-11-22 DIAGNOSIS — G309 Alzheimer's disease, unspecified: Secondary | ICD-10-CM | POA: Diagnosis not present

## 2020-11-22 DIAGNOSIS — R278 Other lack of coordination: Secondary | ICD-10-CM | POA: Diagnosis not present

## 2020-11-22 DIAGNOSIS — M6281 Muscle weakness (generalized): Secondary | ICD-10-CM | POA: Diagnosis not present

## 2020-11-27 DIAGNOSIS — G309 Alzheimer's disease, unspecified: Secondary | ICD-10-CM | POA: Diagnosis not present

## 2020-11-27 DIAGNOSIS — M6281 Muscle weakness (generalized): Secondary | ICD-10-CM | POA: Diagnosis not present

## 2020-11-27 DIAGNOSIS — G9341 Metabolic encephalopathy: Secondary | ICD-10-CM | POA: Diagnosis not present

## 2020-11-27 DIAGNOSIS — R278 Other lack of coordination: Secondary | ICD-10-CM | POA: Diagnosis not present

## 2020-11-28 DIAGNOSIS — R278 Other lack of coordination: Secondary | ICD-10-CM | POA: Diagnosis not present

## 2020-11-28 DIAGNOSIS — G309 Alzheimer's disease, unspecified: Secondary | ICD-10-CM | POA: Diagnosis not present

## 2020-11-28 DIAGNOSIS — G9341 Metabolic encephalopathy: Secondary | ICD-10-CM | POA: Diagnosis not present

## 2020-11-28 DIAGNOSIS — M6281 Muscle weakness (generalized): Secondary | ICD-10-CM | POA: Diagnosis not present

## 2020-11-30 DIAGNOSIS — G9341 Metabolic encephalopathy: Secondary | ICD-10-CM | POA: Diagnosis not present

## 2020-11-30 DIAGNOSIS — M6281 Muscle weakness (generalized): Secondary | ICD-10-CM | POA: Diagnosis not present

## 2020-11-30 DIAGNOSIS — R278 Other lack of coordination: Secondary | ICD-10-CM | POA: Diagnosis not present

## 2020-11-30 DIAGNOSIS — G309 Alzheimer's disease, unspecified: Secondary | ICD-10-CM | POA: Diagnosis not present

## 2020-12-02 DIAGNOSIS — J189 Pneumonia, unspecified organism: Secondary | ICD-10-CM | POA: Diagnosis not present

## 2020-12-02 DIAGNOSIS — F0280 Dementia in other diseases classified elsewhere without behavioral disturbance: Secondary | ICD-10-CM | POA: Diagnosis not present

## 2020-12-02 DIAGNOSIS — F0150 Vascular dementia without behavioral disturbance: Secondary | ICD-10-CM | POA: Diagnosis not present

## 2020-12-02 DIAGNOSIS — R531 Weakness: Secondary | ICD-10-CM | POA: Diagnosis not present

## 2020-12-02 DIAGNOSIS — J158 Pneumonia due to other specified bacteria: Secondary | ICD-10-CM | POA: Diagnosis not present

## 2020-12-02 DIAGNOSIS — R918 Other nonspecific abnormal finding of lung field: Secondary | ICD-10-CM | POA: Diagnosis not present

## 2020-12-02 DIAGNOSIS — N179 Acute kidney failure, unspecified: Secondary | ICD-10-CM | POA: Diagnosis not present

## 2020-12-02 DIAGNOSIS — J159 Unspecified bacterial pneumonia: Secondary | ICD-10-CM | POA: Diagnosis not present

## 2020-12-02 DIAGNOSIS — N281 Cyst of kidney, acquired: Secondary | ICD-10-CM | POA: Diagnosis not present

## 2020-12-02 DIAGNOSIS — N289 Disorder of kidney and ureter, unspecified: Secondary | ICD-10-CM | POA: Diagnosis not present

## 2020-12-02 DIAGNOSIS — N1419 Nephropathy induced by other drugs, medicaments and biological substances: Secondary | ICD-10-CM | POA: Diagnosis not present

## 2020-12-02 DIAGNOSIS — A419 Sepsis, unspecified organism: Secondary | ICD-10-CM | POA: Diagnosis not present

## 2020-12-02 DIAGNOSIS — R404 Transient alteration of awareness: Secondary | ICD-10-CM | POA: Diagnosis not present

## 2020-12-02 DIAGNOSIS — A4151 Sepsis due to Escherichia coli [E. coli]: Secondary | ICD-10-CM | POA: Diagnosis not present

## 2020-12-02 DIAGNOSIS — N200 Calculus of kidney: Secondary | ICD-10-CM | POA: Diagnosis not present

## 2020-12-02 DIAGNOSIS — E039 Hypothyroidism, unspecified: Secondary | ICD-10-CM | POA: Diagnosis not present

## 2020-12-02 DIAGNOSIS — Z1612 Extended spectrum beta lactamase (ESBL) resistance: Secondary | ICD-10-CM | POA: Diagnosis not present

## 2020-12-02 DIAGNOSIS — K449 Diaphragmatic hernia without obstruction or gangrene: Secondary | ICD-10-CM | POA: Diagnosis not present

## 2020-12-02 DIAGNOSIS — I10 Essential (primary) hypertension: Secondary | ICD-10-CM | POA: Diagnosis not present

## 2020-12-02 DIAGNOSIS — N3000 Acute cystitis without hematuria: Secondary | ICD-10-CM | POA: Diagnosis not present

## 2020-12-02 DIAGNOSIS — N170 Acute kidney failure with tubular necrosis: Secondary | ICD-10-CM | POA: Diagnosis not present

## 2020-12-02 DIAGNOSIS — Z20822 Contact with and (suspected) exposure to covid-19: Secondary | ICD-10-CM | POA: Diagnosis not present

## 2020-12-02 DIAGNOSIS — R062 Wheezing: Secondary | ICD-10-CM | POA: Diagnosis not present

## 2020-12-02 DIAGNOSIS — J9601 Acute respiratory failure with hypoxia: Secondary | ICD-10-CM | POA: Diagnosis not present

## 2020-12-02 DIAGNOSIS — F03C0 Unspecified dementia, severe, without behavioral disturbance, psychotic disturbance, mood disturbance, and anxiety: Secondary | ICD-10-CM | POA: Diagnosis not present

## 2020-12-02 DIAGNOSIS — R509 Fever, unspecified: Secondary | ICD-10-CM | POA: Diagnosis not present

## 2020-12-02 DIAGNOSIS — R0600 Dyspnea, unspecified: Secondary | ICD-10-CM | POA: Diagnosis not present

## 2020-12-02 DIAGNOSIS — R000 Tachycardia, unspecified: Secondary | ICD-10-CM | POA: Diagnosis not present

## 2020-12-02 DIAGNOSIS — G928 Other toxic encephalopathy: Secondary | ICD-10-CM | POA: Diagnosis not present

## 2020-12-02 DIAGNOSIS — N3001 Acute cystitis with hematuria: Secondary | ICD-10-CM | POA: Diagnosis not present

## 2020-12-02 DIAGNOSIS — R059 Cough, unspecified: Secondary | ICD-10-CM | POA: Diagnosis not present

## 2020-12-02 DIAGNOSIS — G9341 Metabolic encephalopathy: Secondary | ICD-10-CM | POA: Diagnosis not present

## 2020-12-02 DIAGNOSIS — Z743 Need for continuous supervision: Secondary | ICD-10-CM | POA: Diagnosis not present

## 2020-12-02 DIAGNOSIS — G309 Alzheimer's disease, unspecified: Secondary | ICD-10-CM | POA: Diagnosis not present

## 2020-12-02 DIAGNOSIS — J168 Pneumonia due to other specified infectious organisms: Secondary | ICD-10-CM | POA: Diagnosis not present

## 2020-12-02 DIAGNOSIS — T368X5A Adverse effect of other systemic antibiotics, initial encounter: Secondary | ICD-10-CM | POA: Diagnosis not present

## 2020-12-02 DIAGNOSIS — K5730 Diverticulosis of large intestine without perforation or abscess without bleeding: Secondary | ICD-10-CM | POA: Diagnosis not present

## 2020-12-02 DIAGNOSIS — R0902 Hypoxemia: Secondary | ICD-10-CM | POA: Diagnosis not present

## 2020-12-02 DIAGNOSIS — N19 Unspecified kidney failure: Secondary | ICD-10-CM | POA: Diagnosis not present

## 2020-12-03 DIAGNOSIS — E039 Hypothyroidism, unspecified: Secondary | ICD-10-CM | POA: Diagnosis not present

## 2020-12-03 DIAGNOSIS — A419 Sepsis, unspecified organism: Secondary | ICD-10-CM | POA: Diagnosis not present

## 2020-12-03 DIAGNOSIS — I10 Essential (primary) hypertension: Secondary | ICD-10-CM | POA: Diagnosis not present

## 2020-12-03 DIAGNOSIS — F03C0 Unspecified dementia, severe, without behavioral disturbance, psychotic disturbance, mood disturbance, and anxiety: Secondary | ICD-10-CM | POA: Diagnosis not present

## 2020-12-04 DIAGNOSIS — F03C0 Unspecified dementia, severe, without behavioral disturbance, psychotic disturbance, mood disturbance, and anxiety: Secondary | ICD-10-CM | POA: Diagnosis not present

## 2020-12-04 DIAGNOSIS — I10 Essential (primary) hypertension: Secondary | ICD-10-CM | POA: Diagnosis not present

## 2020-12-04 DIAGNOSIS — A419 Sepsis, unspecified organism: Secondary | ICD-10-CM | POA: Diagnosis not present

## 2020-12-04 DIAGNOSIS — E039 Hypothyroidism, unspecified: Secondary | ICD-10-CM | POA: Diagnosis not present

## 2020-12-05 DIAGNOSIS — E039 Hypothyroidism, unspecified: Secondary | ICD-10-CM | POA: Diagnosis not present

## 2020-12-05 DIAGNOSIS — A419 Sepsis, unspecified organism: Secondary | ICD-10-CM | POA: Diagnosis not present

## 2020-12-05 DIAGNOSIS — F03C0 Unspecified dementia, severe, without behavioral disturbance, psychotic disturbance, mood disturbance, and anxiety: Secondary | ICD-10-CM | POA: Diagnosis not present

## 2020-12-05 DIAGNOSIS — I10 Essential (primary) hypertension: Secondary | ICD-10-CM | POA: Diagnosis not present

## 2020-12-06 DIAGNOSIS — J159 Unspecified bacterial pneumonia: Secondary | ICD-10-CM | POA: Diagnosis not present

## 2020-12-06 DIAGNOSIS — I10 Essential (primary) hypertension: Secondary | ICD-10-CM | POA: Diagnosis not present

## 2020-12-06 DIAGNOSIS — F0280 Dementia in other diseases classified elsewhere without behavioral disturbance: Secondary | ICD-10-CM | POA: Diagnosis not present

## 2020-12-06 DIAGNOSIS — F0150 Vascular dementia without behavioral disturbance: Secondary | ICD-10-CM | POA: Diagnosis not present

## 2020-12-06 DIAGNOSIS — J9601 Acute respiratory failure with hypoxia: Secondary | ICD-10-CM | POA: Diagnosis not present

## 2020-12-06 DIAGNOSIS — G309 Alzheimer's disease, unspecified: Secondary | ICD-10-CM | POA: Diagnosis not present

## 2020-12-06 DIAGNOSIS — G9341 Metabolic encephalopathy: Secondary | ICD-10-CM | POA: Diagnosis not present

## 2020-12-06 DIAGNOSIS — E039 Hypothyroidism, unspecified: Secondary | ICD-10-CM | POA: Diagnosis not present

## 2020-12-06 DIAGNOSIS — N3001 Acute cystitis with hematuria: Secondary | ICD-10-CM | POA: Diagnosis not present

## 2020-12-07 DIAGNOSIS — G309 Alzheimer's disease, unspecified: Secondary | ICD-10-CM | POA: Diagnosis not present

## 2020-12-07 DIAGNOSIS — E039 Hypothyroidism, unspecified: Secondary | ICD-10-CM | POA: Diagnosis not present

## 2020-12-07 DIAGNOSIS — N3001 Acute cystitis with hematuria: Secondary | ICD-10-CM | POA: Diagnosis not present

## 2020-12-07 DIAGNOSIS — J159 Unspecified bacterial pneumonia: Secondary | ICD-10-CM | POA: Diagnosis not present

## 2020-12-07 DIAGNOSIS — I10 Essential (primary) hypertension: Secondary | ICD-10-CM | POA: Diagnosis not present

## 2020-12-07 DIAGNOSIS — J189 Pneumonia, unspecified organism: Secondary | ICD-10-CM | POA: Diagnosis not present

## 2020-12-07 DIAGNOSIS — J9601 Acute respiratory failure with hypoxia: Secondary | ICD-10-CM | POA: Diagnosis not present

## 2020-12-07 DIAGNOSIS — G9341 Metabolic encephalopathy: Secondary | ICD-10-CM | POA: Diagnosis not present

## 2020-12-07 DIAGNOSIS — N179 Acute kidney failure, unspecified: Secondary | ICD-10-CM | POA: Diagnosis not present

## 2020-12-08 DIAGNOSIS — N179 Acute kidney failure, unspecified: Secondary | ICD-10-CM | POA: Diagnosis not present

## 2020-12-08 DIAGNOSIS — J9601 Acute respiratory failure with hypoxia: Secondary | ICD-10-CM | POA: Diagnosis not present

## 2020-12-08 DIAGNOSIS — J189 Pneumonia, unspecified organism: Secondary | ICD-10-CM | POA: Diagnosis not present

## 2020-12-08 DIAGNOSIS — G9341 Metabolic encephalopathy: Secondary | ICD-10-CM | POA: Diagnosis not present

## 2020-12-08 DIAGNOSIS — N3001 Acute cystitis with hematuria: Secondary | ICD-10-CM | POA: Diagnosis not present

## 2020-12-08 DIAGNOSIS — E039 Hypothyroidism, unspecified: Secondary | ICD-10-CM | POA: Diagnosis not present

## 2020-12-08 DIAGNOSIS — I10 Essential (primary) hypertension: Secondary | ICD-10-CM | POA: Diagnosis not present

## 2020-12-08 DIAGNOSIS — G309 Alzheimer's disease, unspecified: Secondary | ICD-10-CM | POA: Diagnosis not present

## 2020-12-08 DIAGNOSIS — J159 Unspecified bacterial pneumonia: Secondary | ICD-10-CM | POA: Diagnosis not present

## 2020-12-09 DIAGNOSIS — N3001 Acute cystitis with hematuria: Secondary | ICD-10-CM | POA: Diagnosis not present

## 2020-12-09 DIAGNOSIS — J9601 Acute respiratory failure with hypoxia: Secondary | ICD-10-CM | POA: Diagnosis not present

## 2020-12-09 DIAGNOSIS — I10 Essential (primary) hypertension: Secondary | ICD-10-CM | POA: Diagnosis not present

## 2020-12-09 DIAGNOSIS — G9341 Metabolic encephalopathy: Secondary | ICD-10-CM | POA: Diagnosis not present

## 2020-12-09 DIAGNOSIS — N179 Acute kidney failure, unspecified: Secondary | ICD-10-CM | POA: Diagnosis not present

## 2020-12-09 DIAGNOSIS — J189 Pneumonia, unspecified organism: Secondary | ICD-10-CM | POA: Diagnosis not present

## 2020-12-09 DIAGNOSIS — J159 Unspecified bacterial pneumonia: Secondary | ICD-10-CM | POA: Diagnosis not present

## 2020-12-09 DIAGNOSIS — G309 Alzheimer's disease, unspecified: Secondary | ICD-10-CM | POA: Diagnosis not present

## 2020-12-09 DIAGNOSIS — E039 Hypothyroidism, unspecified: Secondary | ICD-10-CM | POA: Diagnosis not present

## 2020-12-10 DIAGNOSIS — G9341 Metabolic encephalopathy: Secondary | ICD-10-CM | POA: Diagnosis not present

## 2020-12-10 DIAGNOSIS — I10 Essential (primary) hypertension: Secondary | ICD-10-CM | POA: Diagnosis not present

## 2020-12-10 DIAGNOSIS — G309 Alzheimer's disease, unspecified: Secondary | ICD-10-CM | POA: Diagnosis not present

## 2020-12-10 DIAGNOSIS — J9601 Acute respiratory failure with hypoxia: Secondary | ICD-10-CM | POA: Diagnosis not present

## 2020-12-10 DIAGNOSIS — E039 Hypothyroidism, unspecified: Secondary | ICD-10-CM | POA: Diagnosis not present

## 2020-12-10 DIAGNOSIS — N179 Acute kidney failure, unspecified: Secondary | ICD-10-CM | POA: Diagnosis not present

## 2020-12-10 DIAGNOSIS — J159 Unspecified bacterial pneumonia: Secondary | ICD-10-CM | POA: Diagnosis not present

## 2020-12-10 DIAGNOSIS — J189 Pneumonia, unspecified organism: Secondary | ICD-10-CM | POA: Diagnosis not present

## 2020-12-10 DIAGNOSIS — N3001 Acute cystitis with hematuria: Secondary | ICD-10-CM | POA: Diagnosis not present

## 2020-12-11 DIAGNOSIS — F0150 Vascular dementia without behavioral disturbance: Secondary | ICD-10-CM | POA: Diagnosis not present

## 2020-12-11 DIAGNOSIS — J9601 Acute respiratory failure with hypoxia: Secondary | ICD-10-CM | POA: Diagnosis not present

## 2020-12-11 DIAGNOSIS — E039 Hypothyroidism, unspecified: Secondary | ICD-10-CM | POA: Diagnosis not present

## 2020-12-11 DIAGNOSIS — I10 Essential (primary) hypertension: Secondary | ICD-10-CM | POA: Diagnosis not present

## 2020-12-11 DIAGNOSIS — G309 Alzheimer's disease, unspecified: Secondary | ICD-10-CM | POA: Diagnosis not present

## 2020-12-11 DIAGNOSIS — J189 Pneumonia, unspecified organism: Secondary | ICD-10-CM | POA: Diagnosis not present

## 2020-12-11 DIAGNOSIS — J159 Unspecified bacterial pneumonia: Secondary | ICD-10-CM | POA: Diagnosis not present

## 2020-12-11 DIAGNOSIS — N179 Acute kidney failure, unspecified: Secondary | ICD-10-CM | POA: Diagnosis not present

## 2020-12-11 DIAGNOSIS — N3001 Acute cystitis with hematuria: Secondary | ICD-10-CM | POA: Diagnosis not present

## 2020-12-12 DIAGNOSIS — N3001 Acute cystitis with hematuria: Secondary | ICD-10-CM | POA: Diagnosis not present

## 2020-12-12 DIAGNOSIS — A4151 Sepsis due to Escherichia coli [E. coli]: Secondary | ICD-10-CM | POA: Diagnosis not present

## 2020-12-12 DIAGNOSIS — N170 Acute kidney failure with tubular necrosis: Secondary | ICD-10-CM | POA: Diagnosis not present

## 2020-12-13 DIAGNOSIS — N170 Acute kidney failure with tubular necrosis: Secondary | ICD-10-CM | POA: Diagnosis not present

## 2020-12-13 DIAGNOSIS — N3001 Acute cystitis with hematuria: Secondary | ICD-10-CM | POA: Diagnosis not present

## 2020-12-13 DIAGNOSIS — A4151 Sepsis due to Escherichia coli [E. coli]: Secondary | ICD-10-CM | POA: Diagnosis not present

## 2020-12-14 DIAGNOSIS — A419 Sepsis, unspecified organism: Secondary | ICD-10-CM | POA: Diagnosis not present

## 2020-12-14 DIAGNOSIS — N3001 Acute cystitis with hematuria: Secondary | ICD-10-CM | POA: Diagnosis not present

## 2020-12-14 DIAGNOSIS — N170 Acute kidney failure with tubular necrosis: Secondary | ICD-10-CM | POA: Diagnosis not present

## 2020-12-17 DIAGNOSIS — R55 Syncope and collapse: Secondary | ICD-10-CM | POA: Diagnosis not present

## 2020-12-18 DIAGNOSIS — G309 Alzheimer's disease, unspecified: Secondary | ICD-10-CM | POA: Diagnosis not present

## 2020-12-18 DIAGNOSIS — R55 Syncope and collapse: Secondary | ICD-10-CM | POA: Diagnosis not present

## 2020-12-18 DIAGNOSIS — N200 Calculus of kidney: Secondary | ICD-10-CM | POA: Diagnosis not present

## 2020-12-18 DIAGNOSIS — D72829 Elevated white blood cell count, unspecified: Secondary | ICD-10-CM | POA: Diagnosis not present

## 2020-12-18 DIAGNOSIS — R918 Other nonspecific abnormal finding of lung field: Secondary | ICD-10-CM | POA: Diagnosis not present

## 2020-12-18 DIAGNOSIS — B9562 Methicillin resistant Staphylococcus aureus infection as the cause of diseases classified elsewhere: Secondary | ICD-10-CM | POA: Diagnosis not present

## 2020-12-18 DIAGNOSIS — J168 Pneumonia due to other specified infectious organisms: Secondary | ICD-10-CM | POA: Diagnosis not present

## 2020-12-18 DIAGNOSIS — Z20822 Contact with and (suspected) exposure to covid-19: Secondary | ICD-10-CM | POA: Diagnosis not present

## 2020-12-18 DIAGNOSIS — R079 Chest pain, unspecified: Secondary | ICD-10-CM | POA: Diagnosis not present

## 2020-12-18 DIAGNOSIS — E039 Hypothyroidism, unspecified: Secondary | ICD-10-CM | POA: Diagnosis not present

## 2020-12-18 DIAGNOSIS — F0150 Vascular dementia without behavioral disturbance: Secondary | ICD-10-CM | POA: Diagnosis not present

## 2020-12-18 DIAGNOSIS — J440 Chronic obstructive pulmonary disease with acute lower respiratory infection: Secondary | ICD-10-CM | POA: Diagnosis not present

## 2020-12-18 DIAGNOSIS — I358 Other nonrheumatic aortic valve disorders: Secondary | ICD-10-CM | POA: Diagnosis not present

## 2020-12-18 DIAGNOSIS — I2510 Atherosclerotic heart disease of native coronary artery without angina pectoris: Secondary | ICD-10-CM | POA: Diagnosis not present

## 2020-12-18 DIAGNOSIS — R9082 White matter disease, unspecified: Secondary | ICD-10-CM | POA: Diagnosis not present

## 2020-12-18 DIAGNOSIS — J189 Pneumonia, unspecified organism: Secondary | ICD-10-CM | POA: Diagnosis not present

## 2020-12-18 DIAGNOSIS — R4182 Altered mental status, unspecified: Secondary | ICD-10-CM | POA: Diagnosis not present

## 2020-12-18 DIAGNOSIS — N179 Acute kidney failure, unspecified: Secondary | ICD-10-CM | POA: Diagnosis not present

## 2020-12-18 DIAGNOSIS — K769 Liver disease, unspecified: Secondary | ICD-10-CM | POA: Diagnosis not present

## 2020-12-18 DIAGNOSIS — R7881 Bacteremia: Secondary | ICD-10-CM | POA: Diagnosis not present

## 2020-12-18 DIAGNOSIS — N1419 Nephropathy induced by other drugs, medicaments and biological substances: Secondary | ICD-10-CM | POA: Diagnosis not present

## 2020-12-18 DIAGNOSIS — I517 Cardiomegaly: Secondary | ICD-10-CM | POA: Diagnosis not present

## 2020-12-18 DIAGNOSIS — I10 Essential (primary) hypertension: Secondary | ICD-10-CM | POA: Diagnosis not present

## 2020-12-18 DIAGNOSIS — J449 Chronic obstructive pulmonary disease, unspecified: Secondary | ICD-10-CM | POA: Diagnosis not present

## 2020-12-18 DIAGNOSIS — R778 Other specified abnormalities of plasma proteins: Secondary | ICD-10-CM | POA: Diagnosis not present

## 2020-12-18 DIAGNOSIS — N281 Cyst of kidney, acquired: Secondary | ICD-10-CM | POA: Diagnosis not present

## 2020-12-18 DIAGNOSIS — Z8673 Personal history of transient ischemic attack (TIA), and cerebral infarction without residual deficits: Secondary | ICD-10-CM | POA: Diagnosis not present

## 2020-12-18 DIAGNOSIS — Z9049 Acquired absence of other specified parts of digestive tract: Secondary | ICD-10-CM | POA: Diagnosis not present

## 2020-12-18 DIAGNOSIS — I6782 Cerebral ischemia: Secondary | ICD-10-CM | POA: Diagnosis not present

## 2020-12-18 DIAGNOSIS — F0280 Dementia in other diseases classified elsewhere without behavioral disturbance: Secondary | ICD-10-CM | POA: Diagnosis not present

## 2020-12-19 DIAGNOSIS — E039 Hypothyroidism, unspecified: Secondary | ICD-10-CM | POA: Diagnosis not present

## 2020-12-19 DIAGNOSIS — G309 Alzheimer's disease, unspecified: Secondary | ICD-10-CM | POA: Diagnosis not present

## 2020-12-19 DIAGNOSIS — N1419 Nephropathy induced by other drugs, medicaments and biological substances: Secondary | ICD-10-CM | POA: Diagnosis not present

## 2020-12-19 DIAGNOSIS — J449 Chronic obstructive pulmonary disease, unspecified: Secondary | ICD-10-CM | POA: Diagnosis not present

## 2020-12-19 DIAGNOSIS — I10 Essential (primary) hypertension: Secondary | ICD-10-CM | POA: Diagnosis not present

## 2020-12-19 DIAGNOSIS — R778 Other specified abnormalities of plasma proteins: Secondary | ICD-10-CM | POA: Diagnosis not present

## 2020-12-19 DIAGNOSIS — R7881 Bacteremia: Secondary | ICD-10-CM | POA: Diagnosis not present

## 2020-12-19 DIAGNOSIS — F0150 Vascular dementia without behavioral disturbance: Secondary | ICD-10-CM | POA: Diagnosis not present

## 2020-12-19 DIAGNOSIS — R55 Syncope and collapse: Secondary | ICD-10-CM | POA: Diagnosis not present

## 2020-12-20 DIAGNOSIS — R778 Other specified abnormalities of plasma proteins: Secondary | ICD-10-CM | POA: Diagnosis not present

## 2020-12-20 DIAGNOSIS — R7881 Bacteremia: Secondary | ICD-10-CM | POA: Diagnosis not present

## 2020-12-20 DIAGNOSIS — I10 Essential (primary) hypertension: Secondary | ICD-10-CM | POA: Diagnosis not present

## 2020-12-20 DIAGNOSIS — F0150 Vascular dementia without behavioral disturbance: Secondary | ICD-10-CM | POA: Diagnosis not present

## 2020-12-20 DIAGNOSIS — N1419 Nephropathy induced by other drugs, medicaments and biological substances: Secondary | ICD-10-CM | POA: Diagnosis not present

## 2020-12-20 DIAGNOSIS — R55 Syncope and collapse: Secondary | ICD-10-CM | POA: Diagnosis not present

## 2020-12-20 DIAGNOSIS — G309 Alzheimer's disease, unspecified: Secondary | ICD-10-CM | POA: Diagnosis not present

## 2020-12-20 DIAGNOSIS — E039 Hypothyroidism, unspecified: Secondary | ICD-10-CM | POA: Diagnosis not present

## 2020-12-20 DIAGNOSIS — J449 Chronic obstructive pulmonary disease, unspecified: Secondary | ICD-10-CM | POA: Diagnosis not present

## 2020-12-21 DIAGNOSIS — N1419 Nephropathy induced by other drugs, medicaments and biological substances: Secondary | ICD-10-CM | POA: Diagnosis not present

## 2020-12-21 DIAGNOSIS — R55 Syncope and collapse: Secondary | ICD-10-CM | POA: Diagnosis not present

## 2020-12-21 DIAGNOSIS — D649 Anemia, unspecified: Secondary | ICD-10-CM | POA: Diagnosis not present

## 2020-12-21 DIAGNOSIS — J449 Chronic obstructive pulmonary disease, unspecified: Secondary | ICD-10-CM | POA: Diagnosis not present

## 2020-12-21 DIAGNOSIS — D72829 Elevated white blood cell count, unspecified: Secondary | ICD-10-CM | POA: Diagnosis not present

## 2020-12-21 DIAGNOSIS — R778 Other specified abnormalities of plasma proteins: Secondary | ICD-10-CM | POA: Diagnosis not present

## 2020-12-21 DIAGNOSIS — G4733 Obstructive sleep apnea (adult) (pediatric): Secondary | ICD-10-CM | POA: Diagnosis not present

## 2020-12-21 DIAGNOSIS — R7881 Bacteremia: Secondary | ICD-10-CM | POA: Diagnosis not present

## 2020-12-21 DIAGNOSIS — F0150 Vascular dementia without behavioral disturbance: Secondary | ICD-10-CM | POA: Diagnosis not present

## 2020-12-21 DIAGNOSIS — D75839 Thrombocytosis, unspecified: Secondary | ICD-10-CM | POA: Diagnosis not present

## 2020-12-21 DIAGNOSIS — G309 Alzheimer's disease, unspecified: Secondary | ICD-10-CM | POA: Diagnosis not present

## 2020-12-21 DIAGNOSIS — E039 Hypothyroidism, unspecified: Secondary | ICD-10-CM | POA: Diagnosis not present

## 2020-12-21 DIAGNOSIS — I10 Essential (primary) hypertension: Secondary | ICD-10-CM | POA: Diagnosis not present

## 2020-12-21 DIAGNOSIS — J189 Pneumonia, unspecified organism: Secondary | ICD-10-CM | POA: Diagnosis not present

## 2020-12-22 DIAGNOSIS — G309 Alzheimer's disease, unspecified: Secondary | ICD-10-CM | POA: Diagnosis not present

## 2020-12-22 DIAGNOSIS — D72829 Elevated white blood cell count, unspecified: Secondary | ICD-10-CM | POA: Diagnosis not present

## 2020-12-22 DIAGNOSIS — R7881 Bacteremia: Secondary | ICD-10-CM | POA: Diagnosis not present

## 2020-12-22 DIAGNOSIS — R778 Other specified abnormalities of plasma proteins: Secondary | ICD-10-CM | POA: Diagnosis not present

## 2020-12-22 DIAGNOSIS — N1419 Nephropathy induced by other drugs, medicaments and biological substances: Secondary | ICD-10-CM | POA: Diagnosis not present

## 2020-12-22 DIAGNOSIS — I10 Essential (primary) hypertension: Secondary | ICD-10-CM | POA: Diagnosis not present

## 2020-12-22 DIAGNOSIS — J449 Chronic obstructive pulmonary disease, unspecified: Secondary | ICD-10-CM | POA: Diagnosis not present

## 2020-12-22 DIAGNOSIS — R55 Syncope and collapse: Secondary | ICD-10-CM | POA: Diagnosis not present

## 2020-12-22 DIAGNOSIS — E039 Hypothyroidism, unspecified: Secondary | ICD-10-CM | POA: Diagnosis not present

## 2020-12-23 DIAGNOSIS — I10 Essential (primary) hypertension: Secondary | ICD-10-CM | POA: Diagnosis not present

## 2020-12-23 DIAGNOSIS — J449 Chronic obstructive pulmonary disease, unspecified: Secondary | ICD-10-CM | POA: Diagnosis not present

## 2020-12-23 DIAGNOSIS — D72829 Elevated white blood cell count, unspecified: Secondary | ICD-10-CM | POA: Diagnosis not present

## 2020-12-23 DIAGNOSIS — R55 Syncope and collapse: Secondary | ICD-10-CM | POA: Diagnosis not present

## 2020-12-23 DIAGNOSIS — R7881 Bacteremia: Secondary | ICD-10-CM | POA: Diagnosis not present

## 2020-12-23 DIAGNOSIS — R778 Other specified abnormalities of plasma proteins: Secondary | ICD-10-CM | POA: Diagnosis not present

## 2020-12-23 DIAGNOSIS — N1419 Nephropathy induced by other drugs, medicaments and biological substances: Secondary | ICD-10-CM | POA: Diagnosis not present

## 2020-12-23 DIAGNOSIS — G309 Alzheimer's disease, unspecified: Secondary | ICD-10-CM | POA: Diagnosis not present

## 2020-12-23 DIAGNOSIS — E039 Hypothyroidism, unspecified: Secondary | ICD-10-CM | POA: Diagnosis not present

## 2020-12-24 DIAGNOSIS — D72829 Elevated white blood cell count, unspecified: Secondary | ICD-10-CM | POA: Diagnosis not present

## 2020-12-24 DIAGNOSIS — I10 Essential (primary) hypertension: Secondary | ICD-10-CM | POA: Diagnosis not present

## 2020-12-24 DIAGNOSIS — R778 Other specified abnormalities of plasma proteins: Secondary | ICD-10-CM | POA: Diagnosis not present

## 2020-12-24 DIAGNOSIS — R55 Syncope and collapse: Secondary | ICD-10-CM | POA: Diagnosis not present

## 2020-12-24 DIAGNOSIS — E039 Hypothyroidism, unspecified: Secondary | ICD-10-CM | POA: Diagnosis not present

## 2020-12-24 DIAGNOSIS — J449 Chronic obstructive pulmonary disease, unspecified: Secondary | ICD-10-CM | POA: Diagnosis not present

## 2020-12-24 DIAGNOSIS — N1419 Nephropathy induced by other drugs, medicaments and biological substances: Secondary | ICD-10-CM | POA: Diagnosis not present

## 2020-12-24 DIAGNOSIS — R7881 Bacteremia: Secondary | ICD-10-CM | POA: Diagnosis not present

## 2020-12-24 DIAGNOSIS — G309 Alzheimer's disease, unspecified: Secondary | ICD-10-CM | POA: Diagnosis not present

## 2020-12-25 DIAGNOSIS — I10 Essential (primary) hypertension: Secondary | ICD-10-CM | POA: Diagnosis not present

## 2020-12-25 DIAGNOSIS — N1419 Nephropathy induced by other drugs, medicaments and biological substances: Secondary | ICD-10-CM | POA: Diagnosis not present

## 2020-12-25 DIAGNOSIS — R7881 Bacteremia: Secondary | ICD-10-CM | POA: Diagnosis not present

## 2020-12-25 DIAGNOSIS — G309 Alzheimer's disease, unspecified: Secondary | ICD-10-CM | POA: Diagnosis not present

## 2020-12-25 DIAGNOSIS — D72829 Elevated white blood cell count, unspecified: Secondary | ICD-10-CM | POA: Diagnosis not present

## 2020-12-25 DIAGNOSIS — R778 Other specified abnormalities of plasma proteins: Secondary | ICD-10-CM | POA: Diagnosis not present

## 2020-12-25 DIAGNOSIS — E039 Hypothyroidism, unspecified: Secondary | ICD-10-CM | POA: Diagnosis not present

## 2020-12-25 DIAGNOSIS — J449 Chronic obstructive pulmonary disease, unspecified: Secondary | ICD-10-CM | POA: Diagnosis not present

## 2020-12-25 DIAGNOSIS — R55 Syncope and collapse: Secondary | ICD-10-CM | POA: Diagnosis not present

## 2020-12-26 DIAGNOSIS — Z743 Need for continuous supervision: Secondary | ICD-10-CM | POA: Diagnosis not present

## 2020-12-26 DIAGNOSIS — R404 Transient alteration of awareness: Secondary | ICD-10-CM | POA: Diagnosis not present

## 2020-12-26 DIAGNOSIS — F918 Other conduct disorders: Secondary | ICD-10-CM | POA: Diagnosis not present

## 2020-12-27 DIAGNOSIS — G301 Alzheimer's disease with late onset: Secondary | ICD-10-CM | POA: Diagnosis not present

## 2020-12-27 DIAGNOSIS — R0902 Hypoxemia: Secondary | ICD-10-CM | POA: Diagnosis not present

## 2020-12-27 DIAGNOSIS — A419 Sepsis, unspecified organism: Secondary | ICD-10-CM | POA: Diagnosis not present

## 2020-12-27 DIAGNOSIS — J189 Pneumonia, unspecified organism: Secondary | ICD-10-CM | POA: Diagnosis not present

## 2020-12-27 DIAGNOSIS — I2510 Atherosclerotic heart disease of native coronary artery without angina pectoris: Secondary | ICD-10-CM | POA: Diagnosis not present

## 2020-12-27 DIAGNOSIS — I10 Essential (primary) hypertension: Secondary | ICD-10-CM | POA: Diagnosis not present

## 2020-12-29 DIAGNOSIS — E119 Type 2 diabetes mellitus without complications: Secondary | ICD-10-CM | POA: Diagnosis not present

## 2021-01-25 DIAGNOSIS — Z20822 Contact with and (suspected) exposure to covid-19: Secondary | ICD-10-CM | POA: Diagnosis not present

## 2021-01-26 DIAGNOSIS — E039 Hypothyroidism, unspecified: Secondary | ICD-10-CM | POA: Diagnosis not present

## 2021-01-26 DIAGNOSIS — Z85038 Personal history of other malignant neoplasm of large intestine: Secondary | ICD-10-CM | POA: Diagnosis not present

## 2021-01-26 DIAGNOSIS — G319 Degenerative disease of nervous system, unspecified: Secondary | ICD-10-CM | POA: Diagnosis not present

## 2021-01-26 DIAGNOSIS — Z8673 Personal history of transient ischemic attack (TIA), and cerebral infarction without residual deficits: Secondary | ICD-10-CM | POA: Diagnosis not present

## 2021-01-26 DIAGNOSIS — Z880 Allergy status to penicillin: Secondary | ICD-10-CM | POA: Diagnosis not present

## 2021-01-26 DIAGNOSIS — R531 Weakness: Secondary | ICD-10-CM | POA: Diagnosis not present

## 2021-01-26 DIAGNOSIS — R4182 Altered mental status, unspecified: Secondary | ICD-10-CM | POA: Diagnosis not present

## 2021-01-26 DIAGNOSIS — I10 Essential (primary) hypertension: Secondary | ICD-10-CM | POA: Diagnosis not present

## 2021-01-26 DIAGNOSIS — Z20822 Contact with and (suspected) exposure to covid-19: Secondary | ICD-10-CM | POA: Diagnosis not present

## 2021-01-26 DIAGNOSIS — J449 Chronic obstructive pulmonary disease, unspecified: Secondary | ICD-10-CM | POA: Diagnosis not present

## 2021-01-26 DIAGNOSIS — N390 Urinary tract infection, site not specified: Secondary | ICD-10-CM | POA: Diagnosis not present

## 2021-01-26 DIAGNOSIS — R059 Cough, unspecified: Secondary | ICD-10-CM | POA: Diagnosis not present

## 2021-01-26 DIAGNOSIS — I6781 Acute cerebrovascular insufficiency: Secondary | ICD-10-CM | POA: Diagnosis not present

## 2021-01-26 DIAGNOSIS — F0390 Unspecified dementia without behavioral disturbance: Secondary | ICD-10-CM | POA: Diagnosis not present

## 2021-01-26 DIAGNOSIS — R9431 Abnormal electrocardiogram [ECG] [EKG]: Secondary | ICD-10-CM | POA: Diagnosis not present

## 2021-01-26 DIAGNOSIS — R5381 Other malaise: Secondary | ICD-10-CM | POA: Diagnosis not present

## 2021-01-27 DIAGNOSIS — Z7401 Bed confinement status: Secondary | ICD-10-CM | POA: Diagnosis not present

## 2021-01-27 DIAGNOSIS — R404 Transient alteration of awareness: Secondary | ICD-10-CM | POA: Diagnosis not present

## 2021-01-27 DIAGNOSIS — R410 Disorientation, unspecified: Secondary | ICD-10-CM | POA: Diagnosis not present

## 2021-01-27 DIAGNOSIS — R442 Other hallucinations: Secondary | ICD-10-CM | POA: Diagnosis not present

## 2021-01-27 DIAGNOSIS — F918 Other conduct disorders: Secondary | ICD-10-CM | POA: Diagnosis not present

## 2021-01-29 DIAGNOSIS — N390 Urinary tract infection, site not specified: Secondary | ICD-10-CM | POA: Diagnosis not present

## 2021-01-30 DIAGNOSIS — M069 Rheumatoid arthritis, unspecified: Secondary | ICD-10-CM | POA: Diagnosis not present

## 2021-01-30 DIAGNOSIS — E871 Hypo-osmolality and hyponatremia: Secondary | ICD-10-CM | POA: Diagnosis not present

## 2021-01-30 DIAGNOSIS — G309 Alzheimer's disease, unspecified: Secondary | ICD-10-CM | POA: Diagnosis not present

## 2021-01-30 DIAGNOSIS — E872 Acidosis, unspecified: Secondary | ICD-10-CM | POA: Diagnosis not present

## 2021-01-30 DIAGNOSIS — N281 Cyst of kidney, acquired: Secondary | ICD-10-CM | POA: Diagnosis not present

## 2021-01-30 DIAGNOSIS — R451 Restlessness and agitation: Secondary | ICD-10-CM | POA: Diagnosis not present

## 2021-01-30 DIAGNOSIS — F0150 Vascular dementia without behavioral disturbance: Secondary | ICD-10-CM | POA: Diagnosis not present

## 2021-01-30 DIAGNOSIS — K5790 Diverticulosis of intestine, part unspecified, without perforation or abscess without bleeding: Secondary | ICD-10-CM | POA: Diagnosis not present

## 2021-01-30 DIAGNOSIS — G301 Alzheimer's disease with late onset: Secondary | ICD-10-CM | POA: Diagnosis not present

## 2021-01-30 DIAGNOSIS — R627 Adult failure to thrive: Secondary | ICD-10-CM | POA: Diagnosis not present

## 2021-01-30 DIAGNOSIS — R000 Tachycardia, unspecified: Secondary | ICD-10-CM | POA: Diagnosis not present

## 2021-01-30 DIAGNOSIS — J159 Unspecified bacterial pneumonia: Secondary | ICD-10-CM | POA: Diagnosis not present

## 2021-01-30 DIAGNOSIS — J168 Pneumonia due to other specified infectious organisms: Secondary | ICD-10-CM | POA: Diagnosis not present

## 2021-01-30 DIAGNOSIS — R0602 Shortness of breath: Secondary | ICD-10-CM | POA: Diagnosis not present

## 2021-01-30 DIAGNOSIS — N3001 Acute cystitis with hematuria: Secondary | ICD-10-CM | POA: Diagnosis not present

## 2021-01-30 DIAGNOSIS — F419 Anxiety disorder, unspecified: Secondary | ICD-10-CM | POA: Diagnosis not present

## 2021-01-30 DIAGNOSIS — Z7189 Other specified counseling: Secondary | ICD-10-CM | POA: Diagnosis not present

## 2021-01-30 DIAGNOSIS — G9340 Encephalopathy, unspecified: Secondary | ICD-10-CM | POA: Diagnosis not present

## 2021-01-30 DIAGNOSIS — M2550 Pain in unspecified joint: Secondary | ICD-10-CM | POA: Diagnosis not present

## 2021-01-30 DIAGNOSIS — J189 Pneumonia, unspecified organism: Secondary | ICD-10-CM | POA: Diagnosis not present

## 2021-01-30 DIAGNOSIS — I10 Essential (primary) hypertension: Secondary | ICD-10-CM | POA: Diagnosis not present

## 2021-01-30 DIAGNOSIS — I248 Other forms of acute ischemic heart disease: Secondary | ICD-10-CM | POA: Diagnosis not present

## 2021-01-30 DIAGNOSIS — I700 Atherosclerosis of aorta: Secondary | ICD-10-CM | POA: Diagnosis not present

## 2021-01-30 DIAGNOSIS — A419 Sepsis, unspecified organism: Secondary | ICD-10-CM | POA: Diagnosis not present

## 2021-01-30 DIAGNOSIS — Z743 Need for continuous supervision: Secondary | ICD-10-CM | POA: Diagnosis not present

## 2021-01-30 DIAGNOSIS — G894 Chronic pain syndrome: Secondary | ICD-10-CM | POA: Diagnosis not present

## 2021-01-30 DIAGNOSIS — G9341 Metabolic encephalopathy: Secondary | ICD-10-CM | POA: Diagnosis not present

## 2021-01-30 DIAGNOSIS — N200 Calculus of kidney: Secondary | ICD-10-CM | POA: Diagnosis not present

## 2021-01-30 DIAGNOSIS — J449 Chronic obstructive pulmonary disease, unspecified: Secondary | ICD-10-CM | POA: Diagnosis not present

## 2021-01-30 DIAGNOSIS — R4182 Altered mental status, unspecified: Secondary | ICD-10-CM | POA: Diagnosis not present

## 2021-01-30 DIAGNOSIS — Z515 Encounter for palliative care: Secondary | ICD-10-CM | POA: Diagnosis not present

## 2021-01-30 DIAGNOSIS — N179 Acute kidney failure, unspecified: Secondary | ICD-10-CM | POA: Diagnosis not present

## 2021-01-30 DIAGNOSIS — Z7952 Long term (current) use of systemic steroids: Secondary | ICD-10-CM | POA: Diagnosis not present

## 2021-01-30 DIAGNOSIS — A4151 Sepsis due to Escherichia coli [E. coli]: Secondary | ICD-10-CM | POA: Diagnosis not present

## 2021-01-30 DIAGNOSIS — N390 Urinary tract infection, site not specified: Secondary | ICD-10-CM | POA: Diagnosis not present

## 2021-01-30 DIAGNOSIS — R918 Other nonspecific abnormal finding of lung field: Secondary | ICD-10-CM | POA: Diagnosis not present

## 2021-01-30 DIAGNOSIS — J440 Chronic obstructive pulmonary disease with acute lower respiratory infection: Secondary | ICD-10-CM | POA: Diagnosis not present

## 2021-04-25 DIAGNOSIS — E559 Vitamin D deficiency, unspecified: Secondary | ICD-10-CM | POA: Diagnosis not present

## 2021-04-25 DIAGNOSIS — E119 Type 2 diabetes mellitus without complications: Secondary | ICD-10-CM | POA: Diagnosis not present

## 2021-06-06 DIAGNOSIS — R6889 Other general symptoms and signs: Secondary | ICD-10-CM | POA: Diagnosis not present

## 2021-07-13 DIAGNOSIS — R6889 Other general symptoms and signs: Secondary | ICD-10-CM | POA: Diagnosis not present

## 2021-08-08 DEATH — deceased
